# Patient Record
Sex: Male | Born: 1975 | Race: Black or African American | Hispanic: No | Marital: Single | Smoking: Never smoker
Health system: Southern US, Community
[De-identification: ages and names within clinical notes are randomized; demographics above are authoritative.]

## PROBLEM LIST (undated history)

## (undated) DIAGNOSIS — Q211 Atrial septal defect: Secondary | ICD-10-CM

## (undated) DIAGNOSIS — I10 Essential (primary) hypertension: Secondary | ICD-10-CM

## (undated) DIAGNOSIS — Q2112 Patent foramen ovale: Secondary | ICD-10-CM

## (undated) DIAGNOSIS — I639 Cerebral infarction, unspecified: Secondary | ICD-10-CM

## (undated) HISTORY — DX: Patent foramen ovale: Q21.12

## (undated) HISTORY — DX: Atrial septal defect: Q21.1

---

## 2001-02-26 ENCOUNTER — Encounter: Payer: Self-pay | Admitting: Internal Medicine

## 2001-02-26 ENCOUNTER — Emergency Department (HOSPITAL_COMMUNITY): Admission: EM | Admit: 2001-02-26 | Discharge: 2001-02-26 | Payer: Self-pay | Admitting: Emergency Medicine

## 2005-01-30 ENCOUNTER — Emergency Department (HOSPITAL_COMMUNITY): Admission: EM | Admit: 2005-01-30 | Discharge: 2005-01-31 | Payer: Self-pay | Admitting: Emergency Medicine

## 2020-10-01 ENCOUNTER — Inpatient Hospital Stay (HOSPITAL_COMMUNITY): Payer: Medicaid Other

## 2020-10-01 ENCOUNTER — Emergency Department (HOSPITAL_COMMUNITY): Payer: Medicaid Other

## 2020-10-01 ENCOUNTER — Encounter (HOSPITAL_COMMUNITY): Payer: Self-pay | Admitting: Emergency Medicine

## 2020-10-01 ENCOUNTER — Inpatient Hospital Stay (HOSPITAL_COMMUNITY)
Admission: EM | Admit: 2020-10-01 | Discharge: 2020-11-02 | DRG: 061 | Disposition: A | Discharge status: Skilled Nursing Facility | Payer: Medicaid Other | Attending: Neurology | Admitting: Neurology

## 2020-10-01 DIAGNOSIS — G8194 Hemiplegia, unspecified affecting left nondominant side: Secondary | ICD-10-CM | POA: Diagnosis present

## 2020-10-01 DIAGNOSIS — Z833 Family history of diabetes mellitus: Secondary | ICD-10-CM

## 2020-10-01 DIAGNOSIS — R011 Cardiac murmur, unspecified: Secondary | ICD-10-CM

## 2020-10-01 DIAGNOSIS — R29710 NIHSS score 10: Secondary | ICD-10-CM | POA: Diagnosis present

## 2020-10-01 DIAGNOSIS — G936 Cerebral edema: Secondary | ICD-10-CM | POA: Diagnosis present

## 2020-10-01 DIAGNOSIS — R1310 Dysphagia, unspecified: Secondary | ICD-10-CM | POA: Diagnosis present

## 2020-10-01 DIAGNOSIS — R414 Neurologic neglect syndrome: Secondary | ICD-10-CM | POA: Diagnosis present

## 2020-10-01 DIAGNOSIS — R29810 Facial weakness: Secondary | ICD-10-CM | POA: Diagnosis present

## 2020-10-01 DIAGNOSIS — I161 Hypertensive emergency: Secondary | ICD-10-CM | POA: Diagnosis present

## 2020-10-01 DIAGNOSIS — R54 Age-related physical debility: Secondary | ICD-10-CM | POA: Diagnosis not present

## 2020-10-01 DIAGNOSIS — Z8049 Family history of malignant neoplasm of other genital organs: Secondary | ICD-10-CM | POA: Diagnosis not present

## 2020-10-01 DIAGNOSIS — Z823 Family history of stroke: Secondary | ICD-10-CM | POA: Diagnosis not present

## 2020-10-01 DIAGNOSIS — R471 Dysarthria and anarthria: Secondary | ICD-10-CM | POA: Diagnosis present

## 2020-10-01 DIAGNOSIS — E7800 Pure hypercholesterolemia, unspecified: Secondary | ICD-10-CM | POA: Diagnosis present

## 2020-10-01 DIAGNOSIS — Q211 Atrial septal defect: Secondary | ICD-10-CM

## 2020-10-01 DIAGNOSIS — Z20822 Contact with and (suspected) exposure to covid-19: Secondary | ICD-10-CM | POA: Diagnosis present

## 2020-10-01 DIAGNOSIS — I119 Hypertensive heart disease without heart failure: Secondary | ICD-10-CM | POA: Diagnosis present

## 2020-10-01 DIAGNOSIS — E785 Hyperlipidemia, unspecified: Secondary | ICD-10-CM | POA: Diagnosis present

## 2020-10-01 DIAGNOSIS — R509 Fever, unspecified: Secondary | ICD-10-CM | POA: Diagnosis present

## 2020-10-01 DIAGNOSIS — D649 Anemia, unspecified: Secondary | ICD-10-CM | POA: Diagnosis present

## 2020-10-01 DIAGNOSIS — I63511 Cerebral infarction due to unspecified occlusion or stenosis of right middle cerebral artery: Secondary | ICD-10-CM

## 2020-10-01 DIAGNOSIS — I313 Pericardial effusion (noninflammatory): Secondary | ICD-10-CM | POA: Diagnosis present

## 2020-10-01 DIAGNOSIS — N390 Urinary tract infection, site not specified: Secondary | ICD-10-CM | POA: Diagnosis not present

## 2020-10-01 DIAGNOSIS — K567 Ileus, unspecified: Secondary | ICD-10-CM

## 2020-10-01 DIAGNOSIS — I429 Cardiomyopathy, unspecified: Secondary | ICD-10-CM | POA: Diagnosis present

## 2020-10-01 DIAGNOSIS — E876 Hypokalemia: Secondary | ICD-10-CM | POA: Diagnosis present

## 2020-10-01 DIAGNOSIS — B961 Klebsiella pneumoniae [K. pneumoniae] as the cause of diseases classified elsewhere: Secondary | ICD-10-CM | POA: Diagnosis not present

## 2020-10-01 DIAGNOSIS — F3289 Other specified depressive episodes: Secondary | ICD-10-CM | POA: Diagnosis not present

## 2020-10-01 DIAGNOSIS — I639 Cerebral infarction, unspecified: Secondary | ICD-10-CM

## 2020-10-01 DIAGNOSIS — R112 Nausea with vomiting, unspecified: Secondary | ICD-10-CM | POA: Diagnosis present

## 2020-10-01 DIAGNOSIS — R4189 Other symptoms and signs involving cognitive functions and awareness: Secondary | ICD-10-CM | POA: Diagnosis present

## 2020-10-01 DIAGNOSIS — Z560 Unemployment, unspecified: Secondary | ICD-10-CM | POA: Diagnosis not present

## 2020-10-01 DIAGNOSIS — I454 Nonspecific intraventricular block: Secondary | ICD-10-CM | POA: Diagnosis not present

## 2020-10-01 DIAGNOSIS — Z6820 Body mass index (BMI) 20.0-20.9, adult: Secondary | ICD-10-CM

## 2020-10-01 DIAGNOSIS — I10 Essential (primary) hypertension: Secondary | ICD-10-CM

## 2020-10-01 DIAGNOSIS — I63411 Cerebral infarction due to embolism of right middle cerebral artery: Principal | ICD-10-CM | POA: Diagnosis present

## 2020-10-01 DIAGNOSIS — I255 Ischemic cardiomyopathy: Secondary | ICD-10-CM

## 2020-10-01 HISTORY — DX: Essential (primary) hypertension: I10

## 2020-10-01 LAB — I-STAT CHEM 8, ED
BUN: 14 mg/dL (ref 6–20)
Calcium, Ion: 1.06 mmol/L — ABNORMAL LOW (ref 1.15–1.40)
Chloride: 105 mmol/L (ref 98–111)
Creatinine, Ser: 0.8 mg/dL (ref 0.61–1.24)
Glucose, Bld: 170 mg/dL — ABNORMAL HIGH (ref 70–99)
HCT: 28 % — ABNORMAL LOW (ref 39.0–52.0)
Hemoglobin: 9.5 g/dL — ABNORMAL LOW (ref 13.0–17.0)
Potassium: 3.6 mmol/L (ref 3.5–5.1)
Sodium: 137 mmol/L (ref 135–145)
TCO2: 24 mmol/L (ref 22–32)

## 2020-10-01 LAB — DIFFERENTIAL
Abs Immature Granulocytes: 0.06 10*3/uL (ref 0.00–0.07)
Basophils Absolute: 0.1 10*3/uL (ref 0.0–0.1)
Basophils Relative: 1 %
Eosinophils Absolute: 0 10*3/uL (ref 0.0–0.5)
Eosinophils Relative: 0 %
Immature Granulocytes: 1 %
Lymphocytes Relative: 14 %
Lymphs Abs: 1.5 10*3/uL (ref 0.7–4.0)
Monocytes Absolute: 0.8 10*3/uL (ref 0.1–1.0)
Monocytes Relative: 7 %
Neutro Abs: 8.4 10*3/uL — ABNORMAL HIGH (ref 1.7–7.7)
Neutrophils Relative %: 77 %

## 2020-10-01 LAB — COMPREHENSIVE METABOLIC PANEL
ALT: 11 U/L (ref 0–44)
AST: 24 U/L (ref 15–41)
Albumin: 2.1 g/dL — ABNORMAL LOW (ref 3.5–5.0)
Alkaline Phosphatase: 44 U/L (ref 38–126)
Anion gap: 12 (ref 5–15)
BUN: 12 mg/dL (ref 6–20)
CO2: 21 mmol/L — ABNORMAL LOW (ref 22–32)
Calcium: 8.4 mg/dL — ABNORMAL LOW (ref 8.9–10.3)
Chloride: 105 mmol/L (ref 98–111)
Creatinine, Ser: 0.92 mg/dL (ref 0.61–1.24)
GFR, Estimated: >60 mL/min (ref >60)
Glucose, Bld: 170 mg/dL — ABNORMAL HIGH (ref 70–99)
Potassium: 3.6 mmol/L (ref 3.5–5.1)
Sodium: 138 mmol/L (ref 135–145)
Total Bilirubin: 0.9 mg/dL (ref 0.3–1.2)
Total Protein: 5 g/dL — ABNORMAL LOW (ref 6.5–8.1)

## 2020-10-01 LAB — PROTIME-INR
INR: 1.1 (ref 0.8–1.2)
Prothrombin Time: 13.8 seconds (ref 11.4–15.2)

## 2020-10-01 LAB — CBC
HCT: 32.6 % — ABNORMAL LOW (ref 39.0–52.0)
Hemoglobin: 10.7 g/dL — ABNORMAL LOW (ref 13.0–17.0)
MCH: 29 pg (ref 26.0–34.0)
MCHC: 32.8 g/dL (ref 30.0–36.0)
MCV: 88.3 fL (ref 80.0–100.0)
Platelets: 347 10*3/uL (ref 150–400)
RBC: 3.69 MIL/uL — ABNORMAL LOW (ref 4.22–5.81)
RDW: 13.1 % (ref 11.5–15.5)
WBC: 10.9 10*3/uL — ABNORMAL HIGH (ref 4.0–10.5)
nRBC: 0 % (ref 0.0–0.2)

## 2020-10-01 LAB — APTT: aPTT: 22 seconds — ABNORMAL LOW (ref 24–36)

## 2020-10-01 MED ORDER — ORAL CARE MOUTH RINSE
15.0000 mL | Freq: Two times a day (BID) | OROMUCOSAL | Status: DC
  Administered 2020-10-02 – 2020-10-11 (×15): 15 mL via OROMUCOSAL

## 2020-10-01 MED ORDER — ACETAMINOPHEN 650 MG RE SUPP
650.0000 mg | RECTAL | Status: DC | PRN
Start: 2020-10-01 — End: 2020-11-02

## 2020-10-01 MED ORDER — ONDANSETRON HCL 4 MG/2ML IJ SOLN
4.0000 mg | Freq: Once | INTRAMUSCULAR | Status: AC
  Administered 2020-10-01: 4 mg via INTRAVENOUS
  Filled 2020-10-01: qty 2

## 2020-10-01 MED ORDER — CHLORHEXIDINE GLUCONATE CLOTH 2 % EX PADS
6.0000 | MEDICATED_PAD | Freq: Every day | CUTANEOUS | Status: DC
  Administered 2020-10-01 – 2020-10-19 (×16): 6 via TOPICAL

## 2020-10-01 MED ORDER — CHLORHEXIDINE GLUCONATE 0.12 % MT SOLN
15.0000 mL | Freq: Two times a day (BID) | OROMUCOSAL | Status: DC
  Administered 2020-10-01 – 2020-11-02 (×60): 15 mL via OROMUCOSAL
  Filled 2020-10-01 (×59): qty 15

## 2020-10-01 MED ORDER — SENNOSIDES-DOCUSATE SODIUM 8.6-50 MG PO TABS
1.0000 | ORAL_TABLET | Freq: Every evening | ORAL | Status: DC | PRN
  Administered 2020-10-06: 1 via ORAL
  Filled 2020-10-01 (×3): qty 1

## 2020-10-01 MED ORDER — STROKE: EARLY STAGES OF RECOVERY BOOK
Freq: Once | Status: DC
  Filled 2020-10-01: qty 1

## 2020-10-01 MED ORDER — ACETAMINOPHEN 325 MG PO TABS
650.0000 mg | ORAL_TABLET | ORAL | Status: DC | PRN
  Administered 2020-10-03 – 2020-10-10 (×7): 650 mg via ORAL
  Filled 2020-10-01 (×7): qty 2

## 2020-10-01 MED ORDER — ACETAMINOPHEN 160 MG/5ML PO SOLN: 650.0000 mg | ORAL | Status: DC | PRN

## 2020-10-01 MED ORDER — IOHEXOL 350 MG/ML SOLN
75.0000 mL | Freq: Once | INTRAVENOUS | Status: AC | PRN
  Administered 2020-10-01: 75 mL via INTRAVENOUS

## 2020-10-01 MED ORDER — ALTEPLASE (STROKE) FULL DOSE INFUSION
0.9000 mg/kg | Freq: Once | INTRAVENOUS | Status: AC
  Administered 2020-10-01: 53.6 mg via INTRAVENOUS
  Filled 2020-10-01: qty 100

## 2020-10-01 MED ORDER — SODIUM CHLORIDE 0.9 % IV SOLN: INTRAVENOUS | Status: DC

## 2020-10-01 MED ORDER — CLEVIDIPINE BUTYRATE 0.5 MG/ML IV EMUL
0.0000 mg/h | INTRAVENOUS | Status: DC
  Administered 2020-10-01: 2 mg/h via INTRAVENOUS

## 2020-10-01 MED ORDER — SODIUM CHLORIDE 0.9 % IV SOLN
50.0000 mL | Freq: Once | INTRAVENOUS | Status: AC
  Administered 2020-10-01: 50 mL via INTRAVENOUS

## 2020-10-01 MED ORDER — SODIUM CHLORIDE 0.9% FLUSH
3.0000 mL | Freq: Once | INTRAVENOUS | Status: AC
Start: 2020-10-01 — End: 2020-10-01
  Administered 2020-10-01: 3 mL via INTRAVENOUS

## 2020-10-01 MED ORDER — PANTOPRAZOLE SODIUM 40 MG IV SOLR
40.0000 mg | Freq: Every day | INTRAVENOUS | Status: DC
  Administered 2020-10-01: 40 mg via INTRAVENOUS
  Filled 2020-10-01: qty 40

## 2020-10-01 NOTE — Progress Notes (Signed)
PHARMACIST CODE STROKE RESPONSE  Notified to mix tPA at 1823 by Dr. Roda Shutters Delivered tPA to RN at 1827  tPA dose = 5.4mg  bolus over 1 minute followed by 48.2mg  for a total dose of 53.6mg  over 1 hour  Issues/delays encountered (if applicable): HTN control requiring multiple doses of labetalol and cleveiprex gtt, pt N/V control in CT scanner  Harry [name] 10/01/20 7:13 PM

## 2020-10-01 NOTE — ED Notes (Signed)
Patient at this time seen vomiting. Patient cleaned and placed in new gown. MDF called for PRN orders

## 2020-10-01 NOTE — ED Triage Notes (Signed)
Patient to ED for complaints of possible stroke from home. LKW 1525. Patient states he was reading and had episode of vomiting and was not able to move left side. Brother called EMS. Patient AxO4, answering questions app, left sided neglect and facial droop noted.

## 2020-10-01 NOTE — H&P (Signed)
Stroke Neurology admission Note  Consult Requested by: Dr. Wilkie Aye  Reason for Consult: code stroke  Consult Date: 10/01/20   The history was obtained from the pt and EMS.  During history and examination, all items were able to obtain unless otherwise noted.  History of Present Illness:  Harry [name] is a 45 y.o. African American male with PMH of heart murmur not on medications presented to ER for acute onset left arm and leg weakness, left neglect, right gaze preference, nausea/vomiting.  Patient fully awake alert orientated, per patient he stated that he was reading from his cell phone around 5:25 PM, he had acute onset not feeling well, nausea and vomiting.  However, he denies any arm or leg weakness numbness.  His brother is in the same house but given room with him, brother saw him last at 75 AM at baseline, but then saw him after he had nausea vomiting and noticed that he was not able to move on the left side with left facial droop.  EMS were called.  On arrival BP 192/128, glucose 182.  Patient sent to ER for evaluation with code stroke activation.  In ER, patient was found to have right gaze preference, left visual and sensation neglect, left arm flaccid, left lower extremity weakness.  CT early ischemic changes around right MCA but no hypodensity.  Discussed with patient regarding better and the risk of tPA, he agreed to proceed.  However, patient BP was high, needed labetalol and Cleviprex drip for BP control and his BP at 167/103 at time of given tPA.  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  Discussed with patient regarding risk and benefit of thrombectomy, patient stated that he is scared with the procedure and his uncle died on the operating room and he refused thrombectomy.  Will admit patient for post tPA care and stroke work-up.  LSN: 5:25 PM tPA Given: Yes Thrombectomy: Patient declined  MRS = 0  History reviewed. No pertinent past medical  history.  History reviewed. No pertinent surgical history.  History reviewed. No pertinent family history.  Social History:  has no history on file for tobacco use, alcohol use, and drug use.  Allergies: Not on File  No current facility-administered medications on file prior to encounter.   No current outpatient medications on file prior to encounter.    Review of Systems: A full ROS was attempted today and was able to be performed.  Systems assessed include - Constitutional, Eyes, HENT, Respiratory, Cardiovascular, Gastrointestinal, Genitourinary, Integument/breast, Hematologic/lymphatic, Musculoskeletal, Neurological, Behavioral/Psych, Endocrine, Allergic/Immunologic - with pertinent responses as per HPI.  Physical Examination: Pulse Rate:  [95] 95 (04/18 1815) Resp:  [16] 16 (04/18 1815) BP: (181)/(114) 181/114 (04/18 1815) SpO2:  [96 %] 96 % (04/18 1815) Weight:  [59.6 kg] 59.6 kg (04/18 1815)  General - well nourished, well developed, lethargic.    Ophthalmologic - fundi not visualized due to noncooperation.    Cardiovascular - regular rhythm and rate  Neuro - lethargic, but awake, alert, eyes open on voice, orientated to age, place, time and people. No aphasia, fluent language, following all simple commands. Able to name and repeat.  No dysarthria.  Right gaze preference, however able to have complete left gaze, visual field full no hemianopia, however left simultanagnosia, PERRL.  Left facial droop. Tongue protrusion to the left.  Right upper and lower extremity 5/5, left upper extremity flaccid with slight withdrawal to pain, left lower extremity 3/5 with drift but not  hitting bed within 5 seconds.  Sensation subjectively symmetrical, however left sensory neglect.  Right finger-to-nose intact.  Gait not tested.    NIH Stroke Scale  Level Of Consciousness 0=Alert; keenly responsive 1=Arouse to minor stimulation 2=Requires repeated stimulation to arouse or movements to  pain 3=postures or unresponsive 1  LOC Questions to Month and Age 31=Answers both questions correctly 1=Answers one question correctly or dysarthria/intubated/trauma/language barrier 2=Answers neither question correctly or aphasia 0  LOC Commands      -Open/Close eyes     -Open/close grip     -Pantomime commands if communication barrier 0=Performs both tasks correctly 1=Performs one task correctly 2=Performs neighter task correctly 0  Best Gaze     -Only assess horizontal gaze 0=Normal 1=Partial gaze palsy 2=Forced deviation, or total gaze paresis 1  Visual 0=No visual loss 1=Partial hemianopia 2=Complete hemianopia 3=Bilateral hemianopia (blind including cortical blindness) 0  Facial Palsy     -Use grimace if obtunded 0=Normal symmetrical movement 1=Minor paralysis (asymmetry) 2=Partial paralysis (lower face) 3=Complete paralysis (upper and lower face) 2  Motor  0=No drift for 10/5 seconds 1=Drift, but does not hit bed 2=Some antigravity effort, hits  bed 3=No effort against gravity, limb falls 4=No movement 0=Amputation/joint fusion Right Arm 0     Leg 0    Left Arm 3     Leg 1  Limb Ataxia     - FNT/HTS 0=Absent or does not understand or paralyzed or amputation/joint fusion 1=Present in one limb 2=Present in two limbs 0  Sensory 0=Normal 1=Mild to moderate sensory loss 2=Severe to total sensory loss or coma/unresponsive 0  Best Language 0=No aphasia, normal 1=Mild to moderate aphasia 2=Severe aphasia 3=Mute, global aphasia, or coma/unresponsive 0  Dysarthria 0=Normal 1=Mild to moderate 2=Severe, unintelligible or mute/anarthric 0=intubated/unable to test 0  Extinction/Neglect 0=No abnormality 1=visual/tactile/auditory/spatia/personal inattention/Extinction to bilateral simultaneous stimulation 2=Profound neglect/extinction more than 1 modality  2  Total   10     Data Reviewed: CT HEAD CODE STROKE WO CONTRAST  Result Date: 10/01/2020 CLINICAL DATA:  Code  stroke. Acute neuro deficit. Rule out stroke. Left facial droop EXAM: CT HEAD WITHOUT CONTRAST TECHNIQUE: Contiguous axial images were obtained from the base of the skull through the vertex without intravenous contrast. COMPARISON:  None. FINDINGS: Brain: Ventricle size and cerebral volume normal. Well-defined hypodensity left thalamus compatible with chronic infarction. Negative for acute infarct, hemorrhage, mass Vascular: Negative for hyperdense vessel Skull: Negative Sinuses/Orbits: Mild mucosal edema paranasal sinuses. Large right mastoid effusion and middle ear effusion. Left mastoid sinus clear. Other: None ASPECTS (Alberta Stroke Program Early CT Score) - Ganglionic level infarction (caudate, lentiform nuclei, internal capsule, insula, M1-M3 cortex): 7 - Supraganglionic infarction (M4-M6 cortex): 3 Total score (0-10 with 10 being normal): 10 IMPRESSION: 1. No acute intracranial abnormality 2. ASPECTS is 10 3. Code stroke imaging results were communicated on 10/01/2020 at 6:19 pm to provider Roda Shutters via text page Electronically Signed   By: Marlan Palau M.D.   On: 10/01/2020 18:20   CT ANGIO HEAD NECK W WO CM (CODE STROKE)  Result Date: 10/01/2020 CLINICAL DATA:  Left facial droop EXAM: CT HEAD WITHOUT CONTRAST CT ANGIOGRAPHY HEAD AND NECK TECHNIQUE: Multidetector CT imaging of the head and neck was performed using the standard protocol during bolus administration of intravenous contrast. Multiplanar CT image reconstructions and MIPs were obtained to evaluate the vascular anatomy. Carotid stenosis measurements (when applicable) are obtained utilizing NASCET criteria, using the distal internal carotid diameter as the denominator. CONTRAST:  54mL  OMNIPAQUE IOHEXOL 350 MG/ML SOLN COMPARISON:  CT head 10/01/2020 FINDINGS: CTA NECK FINDINGS Aortic arch: Standard branching. Imaged portion shows no evidence of aneurysm or dissection. No significant stenosis of the major arch vessel origins. Right carotid system:  Normal right carotid. Negative for atherosclerotic disease or stenosis. Left carotid system: Normal left carotid. Negative for atherosclerotic disease or stenosis. Vertebral arteries: Both vertebral arteries widely patent without stenosis. Skeleton: Cervicothoracic scoliosis. No acute skeletal abnormality. Multiple caries. Periapical lucency around right lower molar. Other neck: No mass or adenopathy. 12 mm left thyroid nodule. No further imaging necessary. Upper chest: Lung apices clear bilaterally. Review of the MIP images confirms the above findings CTA HEAD FINDINGS Anterior circulation: Anterior and middle cerebral arteries normal bilaterally. No stenosis or large vessel occlusion. Posterior circulation: Normal posterior circulation. No stenosis or large vessel occlusion. Venous sinuses: Normal venous enhancement Anatomic variants: None Delayed phase: Not performed Review of the MIP images confirms the above findings IMPRESSION: Normal CT angiogram head and neck. No significant atherosclerotic disease. No stenosis or intracranial large vessel occlusion. Electronically Signed   By: Marlan Palau M.D.   On: 10/01/2020 19:16    Assessment: 45 y.o. male with PMH of heart murmur not on medications presented to ER for acute onset left arm and leg weakness, left visual and sensory neglect, right gaze preference, left facial droop nausea/vomiting.  Patient awake alert fully orientated, able to make his own decision.  Time onset around 5:25 PM, NIH score 10. CT early ischemic changes right MCA but no hypodensity.  Discussed with patient regarding better and the risk of tPA, he agreed to proceed.  However, patient BP was high with nausea vomiting episodes, needed labetalol and Cleviprex drip for BP control.  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  Discussed with patient regarding risk and benefit of thrombectomy, patient stated that he is scared with the procedure and his uncle died on the  operating room and he refused thrombectomy. We had severe rounds of discussion and he insisted no thrombectomy. Will admit patient for post tPA care and stroke work-up.   Stroke Risk Factors - likely uncontrolled hypertension  Plan: - Admit to ICU for routine post IV tPA care  - Check blood pressure and NIHSS every 15 min for 2 h, then every 30 min for 6 h, and finally every hour for 16 h - BP goal < 180/105 - CT or MRI brain 24 hours post tPA - Stat CT head without contrast if acute neuro changes - NPO until swallowing screen performed and passed - No antiplatelet agents or anticoagulants (including heparin for DVT prophylaxis) in first 24 hours - No Foley catheter, nasogastric tube, arterial catheter or central venous catheter for 24 hours, unless absolutely necessary - Telemetry - Euglycemia  - Avoid hyperthermia, PRN acetaminophen - DVT prophylaxis with SCDs  Marvel Plan, MD PhD Stroke Neurology 10/01/2020 7:46 PM  This patient is critically ill due to acute stroke status post tPA, large vessel occlusion declined procedure, hypertensive emergency, nausea vomiting concerning for aspiration and at significant risk of neurological worsening, death form stroke extension, hemorrhagic conversion, bleeding from tPA, sepsis, seizure. This patient's care requires constant monitoring of vital signs, hemodynamics, respiratory and cardiac monitoring, review of multiple databases, neurological assessment, discussion with family, other specialists and medical decision making of high complexity. I spent 60 minutes of neurocritical care time in the care of this patient.

## 2020-10-01 NOTE — ED Provider Notes (Signed)
[facility] EMERGENCY DEPARTMENT Provider Note   CSN: [id number] Arrival date & time: 10/01/20  1803     History No chief complaint on file.   Harry [name] is a 45 y.o. male.  HPI   45 year old male presents the emergency department as a code stroke.  Patient evaluated at EMS bridge with neuro team at bedside.  History per EMS due to patient's acuity and current symptoms.  Patient reportedly about an hour prior to arrival was sitting down reading when he experienced headache, nausea/vomiting.  His brother was home with him and called an ambulance.  Last reported known normal was around 11 AM.  On arrival there is active vomiting, he is neglecting the left side with profound left-sided weakness.  He is hypertensive.    History reviewed. No pertinent past medical history.  There are no problems to display for this patient.   History reviewed. No pertinent surgical history.     History reviewed. No pertinent family history.     Home Medications Prior to Admission medications   Not on File    Allergies    Patient has no allergy information on record.  Review of Systems   Review of Systems  Unable to perform ROS: Acuity of condition    Physical Exam Updated Vital Signs There were no vitals taken for this visit.  Physical Exam Vitals and nursing note reviewed.  Constitutional:      Appearance: Normal appearance.  HENT:     Head: Normocephalic.     Mouth/Throat:     Mouth: Mucous membranes are moist.  Eyes:     Pupils: Pupils are equal, round, and reactive to light.  Cardiovascular:     Rate and Rhythm: Normal rate.     Comments: Hypertensive Pulmonary:     Effort: Pulmonary effort is normal. No respiratory distress.  Abdominal:     Palpations: Abdomen is soft.  Skin:    General: Skin is warm.  Neurological:     Mental Status: He is alert.     Comments: Please see NIH  Psychiatric:        Mood and Affect: Mood normal.     ED  Results / Procedures / Treatments   Labs (all labs ordered are listed, but only abnormal results are displayed) Labs Reviewed  CBC - Abnormal; Notable for the following components:      Result Value   WBC 10.9 (*)    RBC 3.69 (*)    Hemoglobin 10.7 (*)    HCT 32.6 (*)    All other components within normal limits  DIFFERENTIAL - Abnormal; Notable for the following components:   Neutro Abs 8.4 (*)    All other components within normal limits  I-STAT CHEM 8, ED - Abnormal; Notable for the following components:   Glucose, Bld 170 (*)    Calcium, Ion 1.06 (*)    Hemoglobin 9.5 (*)    HCT 28.0 (*)    All other components within normal limits  PROTIME-INR  APTT  COMPREHENSIVE METABOLIC PANEL  CBG MONITORING, ED    EKG None  Radiology CT HEAD CODE STROKE WO CONTRAST  Result Date: 10/01/2020 CLINICAL DATA:  Code stroke. Acute neuro deficit. Rule out stroke. Left facial droop EXAM: CT HEAD WITHOUT CONTRAST TECHNIQUE: Contiguous axial images were obtained from the base of the skull through the vertex without intravenous contrast. COMPARISON:  None. FINDINGS: Brain: Ventricle size and cerebral volume normal. Well-defined hypodensity left thalamus compatible with chronic  infarction. Negative for acute infarct, hemorrhage, mass Vascular: Negative for hyperdense vessel Skull: Negative Sinuses/Orbits: Mild mucosal edema paranasal sinuses. Large right mastoid effusion and middle ear effusion. Left mastoid sinus clear. Other: None ASPECTS (Alberta Stroke Program Early CT Score) - Ganglionic level infarction (caudate, lentiform nuclei, internal capsule, insula, M1-M3 cortex): 7 - Supraganglionic infarction (M4-M6 cortex): 3 Total score (0-10 with 10 being normal): 10 IMPRESSION: 1. No acute intracranial abnormality 2. ASPECTS is 10 3. Code stroke imaging results were communicated on 10/01/2020 at 6:19 pm to provider Roda Shutters via text page Electronically Signed   By: Marlan Palau M.D.   On: 10/01/2020 18:20     Procedures .Critical Care Performed by: Rozelle Logan, DO Authorized by: Rozelle Logan, DO   Critical care provider statement:    Critical care time (minutes):  45   Critical care was necessary to treat or prevent imminent or life-threatening deterioration of the following conditions:  CNS failure or compromise   Critical care was time spent personally by me on the following activities:  Discussions with consultants, evaluation of patient's response to treatment, examination of patient, ordering and performing treatments and interventions, ordering and review of laboratory studies, ordering and review of radiographic studies, pulse oximetry, re-evaluation of patient's condition, obtaining history from patient or surrogate and review of old charts     Medications Ordered in ED Medications  sodium chloride flush (NS) 0.9 % injection 3 mL (has no administration in time range)  clevidipine (CLEVIPREX) infusion 0.5 mg/mL (has no administration in time range)    ED Course  I have reviewed the triage vital signs and the nursing notes.  Pertinent labs & imaging results that were available during my care of the patient were reviewed by me and considered in my medical decision making (see chart for details).    MDM Rules/Calculators/A&P                          45 year old male presents the emergency department as a code stroke.  Evaluated at the EMS bridge with stroke neurology team at bedside, patient has high NIH score.  Head CT shows no acute bleed, decision made to give tPA by the neurology stroke team, patient was hypertensive, currently receiving labetalol for blood pressure control.  Neuro exam unchanged on arrival to patient room, family will be notified and updated.  Plan for NICU admission for post tPA care frequent neurochecks.  Patients evaluation and results requires admission for further treatment and care. Patient agrees with admission plan, offers no new complaints  and is stable/unchanged at time of admit.  Final Clinical Impression(s) / ED Diagnoses Final diagnoses:  None    Rx / DC Orders ED Discharge Orders    None       Rozelle Logan, DO 10/01/20 1958

## 2020-10-01 NOTE — ED Notes (Signed)
Family at bedside speak with neuro at this time

## 2020-10-02 ENCOUNTER — Inpatient Hospital Stay (HOSPITAL_COMMUNITY): Payer: Medicaid Other

## 2020-10-02 DIAGNOSIS — I6389 Other cerebral infarction: Secondary | ICD-10-CM

## 2020-10-02 LAB — LIPID PANEL
Cholesterol: 297 mg/dL — ABNORMAL HIGH (ref 0–200)
HDL: 73 mg/dL (ref >40)
LDL Cholesterol: 212 mg/dL — ABNORMAL HIGH (ref 0–99)
Total CHOL/HDL Ratio: 4.1 RATIO
Triglycerides: 62 mg/dL (ref <150)
VLDL: 12 mg/dL (ref 0–40)

## 2020-10-02 LAB — SARS CORONAVIRUS 2 (TAT 6-24 HRS): SARS Coronavirus 2: NEGATIVE

## 2020-10-02 LAB — ECHOCARDIOGRAM COMPLETE
AR max vel: 3.97 cm2
AV Area VTI: 3.31 cm2
AV Area mean vel: 3.91 cm2
AV Mean grad: 3 mmHg
AV Peak grad: 5.1 mmHg
Ao pk vel: 1.13 m/s
Area-P 1/2: 4.41 cm2
S' Lateral: 3.4 cm
Weight: 2102.31 oz

## 2020-10-02 LAB — HIV ANTIBODY (ROUTINE TESTING W REFLEX): HIV Screen 4th Generation wRfx: NONREACTIVE

## 2020-10-02 LAB — HEMOGLOBIN A1C
Hgb A1c MFr Bld: 5.3 % (ref 4.8–5.6)
Mean Plasma Glucose: 105.41 mg/dL

## 2020-10-02 LAB — MRSA PCR SCREENING: MRSA by PCR: NEGATIVE

## 2020-10-02 MED ORDER — ATORVASTATIN CALCIUM 80 MG PO TABS
80.0000 mg | ORAL_TABLET | Freq: Every day | ORAL | Status: DC
  Administered 2020-10-02 – 2020-11-02 (×32): 80 mg via ORAL
  Filled 2020-10-02 (×8): qty 1
  Filled 2020-10-02: qty 2
  Filled 2020-10-02 (×23): qty 1

## 2020-10-02 MED ORDER — ASPIRIN EC 325 MG PO TBEC
325.0000 mg | DELAYED_RELEASE_TABLET | Freq: Every day | ORAL | Status: DC
  Administered 2020-10-02 – 2020-10-10 (×9): 325 mg via ORAL
  Filled 2020-10-02 (×10): qty 1

## 2020-10-02 MED ORDER — CLOPIDOGREL BISULFATE 75 MG PO TABS
75.0000 mg | ORAL_TABLET | Freq: Every day | ORAL | Status: DC
  Administered 2020-10-02 – 2020-11-02 (×32): 75 mg via ORAL
  Filled 2020-10-02 (×32): qty 1

## 2020-10-02 MED ORDER — ENOXAPARIN SODIUM 30 MG/0.3ML ~~LOC~~ SOLN
30.0000 mg | SUBCUTANEOUS | Status: DC
  Administered 2020-10-02 – 2020-10-12 (×11): 30 mg via SUBCUTANEOUS
  Filled 2020-10-02 (×11): qty 0.3

## 2020-10-02 MED ORDER — PANTOPRAZOLE SODIUM 40 MG PO TBEC
40.0000 mg | DELAYED_RELEASE_TABLET | Freq: Every day | ORAL | Status: DC
  Filled 2020-10-02: qty 1

## 2020-10-02 MED ORDER — LABETALOL HCL 5 MG/ML IV SOLN
5.0000 mg | INTRAVENOUS | Status: DC | PRN
  Administered 2020-10-03: 5 mg via INTRAVENOUS
  Administered 2020-10-03: 20 mg via INTRAVENOUS
  Filled 2020-10-02 (×5): qty 4

## 2020-10-02 NOTE — Progress Notes (Deleted)
AM labs drawn seemed to be a large drop from previous results, Labs redrawn and results reading closer to previous.

## 2020-10-02 NOTE — Evaluation (Signed)
Clinical/Bedside Swallow Evaluation Patient Details  Name: Harry [name] MRN: [id number] Date of Birth: 1975/09/03  Today's Date: 10/02/2020 Time: SLP Start Time (ACUTE ONLY): 1219 SLP Stop Time (ACUTE ONLY): 1229 SLP Time Calculation (min) (ACUTE ONLY): 9.68 min  Past Medical History: History reviewed. No pertinent past medical history. Past Surgical History: History reviewed. No pertinent surgical history. HPI:  45 y.o. male with PMH of heart murmur not on medications presented to ER 4/18 for acute onset left arm and leg weakness, left visual and sensory neglect, right gaze preference, left facial droop nausea/vomiting. CT head showed early ischemic changes right MCA but no hypodensity; CTA right M2/M3 occlusion.  tPA initiated. Pt declined thrombectomy.   Assessment / Plan / Recommendation Clinical Impression  Pt participated in initial bedside swallowing assessment.  Presents with focal CN deficits on left - V, VII, and XII with facial asymmetry and tongue deviation to left, decreased sensation left.  Pt accepted sips of water from cup and straw; consumed 3 oz with no s/s of aspiration but spillage from left side of mouth.  There was reduced awareness of pureed bolus when presented from left side; residue in left lateral sulcus and spillage on left lips post-swallow without awareness.  Recommend initiating a dysphagia 1 diet for now; thin liquids; crush meds and give in puree. SLP will follow for diet advancement/safety. SLP Visit Diagnosis: Dysphagia, oropharyngeal phase (R13.12)    Aspiration Risk  Mild aspiration risk    Diet Recommendation   dysphagia 1, thin liquids  Medication Administration: Crushed with puree    Other  Recommendations Oral Care Recommendations: Oral care BID   Follow up Recommendations Inpatient Rehab      Frequency and Duration min 2x/week  2 weeks       Prognosis Prognosis for Safe Diet Advancement: Good      Swallow Study   General Date of  Onset: 10/01/20 HPI: 45 y.o. male with PMH of heart murmur not on medications presented to ER 4/18 for acute onset left arm and leg weakness, left visual and sensory neglect, right gaze preference, left facial droop nausea/vomiting. CT head showed early ischemic changes right MCA but no hypodensity; CTA right M2/M3 occlusion.  tPA initiated. Pt declined thrombectomy. Type of Study: Bedside Swallow Evaluation Previous Swallow Assessment: no Diet Prior to this Study: NPO Temperature Spikes Noted: No Respiratory Status: Nasal cannula History of Recent Intubation: No Behavior/Cognition: Alert;Cooperative Oral Cavity Assessment: Within Functional Limits Oral Care Completed by SLP: Recent completion by staff Oral Cavity - Dentition: Adequate natural dentition Vision: Functional for self-feeding Self-Feeding Abilities: Needs assist Patient Positioning: Partially reclined Baseline Vocal Quality: Normal Volitional Cough: Strong Volitional Swallow: Able to elicit    Oral/Motor/Sensory Function Overall Oral Motor/Sensory Function: Moderate impairment Facial ROM: Reduced left;Suspected CN VII (facial) dysfunction Facial Symmetry: Abnormal symmetry left;Suspected CN VII (facial) dysfunction Facial Strength: Reduced left;Suspected CN VII (facial) dysfunction Facial Sensation: Reduced left;Suspected CN V (Trigeminal) dysfunction Lingual Symmetry: Abnormal symmetry left;Suspected CN XII (hypoglossal) dysfunction Lingual Strength: Reduced   Ice Chips Ice chips: Within functional limits   Thin Liquid Thin Liquid: Impaired Presentation: Straw;Cup;Self Fed Oral Phase Impairments: Reduced labial seal Oral Phase Functional Implications: Left anterior spillage    Nectar Thick Nectar Thick Liquid: Not tested   Honey Thick Honey Thick Liquid: Not tested   Puree Puree: Impaired Presentation: Self Fed Oral Phase Functional Implications: Left anterior spillage;Prolonged oral transit   Solid     Solid:  Not tested  Harry [name] 10/02/2020,12:46 PM  Harry Folks L. Samson Frederic, MA CCC/SLP Acute Rehabilitation Services Office number [phone] Pager [phone]

## 2020-10-02 NOTE — Progress Notes (Signed)
OT Cancellation Note  Patient Details Name: Harry [name] MRN: [id number] DOB: Aug 24, 1975   Cancelled Treatment:    Reason Eval/Treat Not Completed: (P) Active bedrest order. Will return as schedule allows.   Brittlyn Cloe M Krimson Massmann Dyer Klug MSOT, OTR/L Acute Rehab Pager: [phone] Office: [phone] 10/02/2020, 7:26 AM

## 2020-10-02 NOTE — Progress Notes (Signed)
Alerted Dr. Roda Shutters that the patient has returned from MRI.

## 2020-10-02 NOTE — Consult Note (Signed)
Physical Medicine and Rehabilitation Consult   Reason for Consult: Stroke with functional deficits.  Referring Physician:  Dr. Roda Shutters  HPI: Harry [name] is a 45 y.o. RH-male with history of heart murmur who was admitted on 10/01/2020 with acute onset of left hemiparesis with sensory deficits,  right gaze preference and N/V.  History taken from chart review, brother, and patient.  BP 192/128 at admission.  CT head unremarkable for acute intracranial process.   He received tPA and follow up CTA head neck showed right M2 branch occlusion supplying right parietal lobe. Thrombectomy recommended but patient declined this as family died on OR table. MRI brain done revealing acute infarcts in anterior and posterior right frontal lobe with edema and focal susceptibility right M1/M2 branch consistent with thrombus.  Echocardiogram with ejection fraction of 40-45%, with global hypokinesis and trivial pericardial effusion.  Hospital course further complicated by dysphagia, started on D1 thin liquid diet due to facial weakness with residue/spillage as well as deficits in awareness, judgment, and problem-solving.  Hospital course also complicated by fevers PT/OT evaluations completed revealing lethargy, left hemiparesis with instability on standing attempts, cognitive deficits affecting ability to process and follow multistep commands as well as visual deficits. CIR recommended due to functional decline.   Review of Systems  Constitutional: Positive for malaise/fatigue. Negative for chills and fever.  HENT: Negative for hearing loss and tinnitus.   Eyes: Negative for blurred vision.  Respiratory: Negative for cough, shortness of breath and stridor.   Cardiovascular: Negative for chest pain, palpitations and leg swelling.  Gastrointestinal: Positive for constipation. Negative for heartburn and nausea.  Genitourinary: Negative for dysuria and urgency.  Musculoskeletal: Negative for back pain, joint pain,  myalgias and neck pain.  Skin: Negative for itching and rash.  Neurological: Positive for speech change, focal weakness and weakness. Negative for dizziness, sensory change and headaches.  Psychiatric/Behavioral: Negative for memory loss. The patient does not have insomnia.   All other systems reviewed and are negative.    Past medical history: Heart murmur   No past surgical history   Family History  Problem Relation Age of Onset  . Cervical cancer Mother   . Stroke Father   . Heart Problems Sister        died from blood clot  . Diabetes Brother      Social History:  Lives with brother. Unemployed--sedentary and "lies around all day" per brother.  He denies use of tobacco, smokeless tobacco, alcohol or illicit drugs.    Allergies: No Known Allergies    No medications prior to admission.    Home: Home Living Family/patient expects to be discharged to:: Private residence Living Arrangements: Other relatives (Brother) Available Help at Discharge: Other (Comment) (Brother works from home) Type of Home: Apartment Home Access: Level entry Home Layout: One level Bathroom Shower/Tub: Tourist information centre manager: None  Functional History: Prior Function Level of Independence: Independent Comments: Does not work Functional Status:  Mobility: Bed Mobility Overal bed mobility: Needs Assistance Bed Mobility: Supine to Sit Supine to sit: Min assist,+2 for physical assistance General bed mobility comments: Assist for LLE negotiation edge of bed, light minA to execute trunk to upright sitting position Transfers Overall transfer level: Needs assistance Equipment used: 2 person hand held assist Transfers: Sit to/from [employer] Sit to Stand: Mod assist,+2 physical assistance Stand pivot transfers: Mod assist,+2 physical assistance General transfer comment: x2 transfers from bed <> chair going towards right side,  then left side  upon return. L knee blocked, noted knee buckle with weight shift. Cues for upright posture, hip extenssion, stepping initiation/sequencing.      ADL: ADL Overall ADL's : Needs assistance/impaired Eating/Feeding: Moderate assistance,Sitting,Bed level Grooming: Moderate assistance,Sitting,Bed level Grooming Details (indicate cue type and reason): Pt able to wipe with mouth with wash cloth while sitting at EOB with Min A; poor lip closure. Mod A for bilateral tasks Upper Body Bathing: Maximal assistance,Sitting,Bed level Lower Body Bathing: Maximal assistance,Sit to/from stand,Bed level Upper Body Dressing : Maximal assistance,Sitting,Bed level Lower Body Dressing: Total assistance,Sit to/from stand Toilet Transfer: Moderate assistance,+2 for physical assistance,+2 for safety/equipment,Stand-pivot Functional mobility during ADLs: Moderate assistance,+2 for physical assistance (stand pivot) General ADL Comments: Pt presenting with decreased functional sue of LUE, vision, cognition, balanc,e strength,. and activity [place]  Cognition: Cognition Overall Cognitive Status: Impaired/Different from baseline Arousal/Alertness: Awake/alert Orientation Level: Oriented X4 Attention: Sustained Sustained Attention: Appears intact Memory: Impaired Memory Impairment: Storage deficit,Retrieval deficit Awareness: Impaired Awareness Impairment: Intellectual impairment Problem Solving: Impaired Problem Solving Impairment: Verbal basic Behaviors: Impulsive Safety/Judgment: Impaired Cognition Arousal/Alertness: Lethargic Behavior During Therapy: Flat affect Overall Cognitive Status: Impaired/Different from baseline Area of Impairment: Attention,Memory,Following commands,Safety/judgement,Awareness,Problem solving Current Attention Level: Sustained Memory: Decreased short-term memory Following Commands: Follows one step commands consistently,Follows multi-step commands  inconsistently Safety/Judgement: Decreased awareness of safety,Decreased awareness of deficits Awareness: Emergent,Intellectual Problem Solving: Difficulty sequencing,Requires verbal cues General Comments: A&Ox4, pt very drowsy, keeping eyes closed throughout majority of session. Able to state he had left sided weakness, but seems to have decreased awareness of functional impact and safety. Follows 1 step commands consistently, needs cues to promote arousal and sustain attention to task.   Blood pressure (!) 168/106, pulse 92, temperature (!) 101 F (38.3 C), temperature source Oral, resp. rate 18, weight 59.6 kg, SpO2 97 %. Physical Exam Vitals and nursing note reviewed.  Constitutional:      Appearance: Normal appearance.  HENT:     Head: Normocephalic and atraumatic.     Right Ear: External ear normal.     Left Ear: External ear normal.     Nose: Nose normal.  Eyes:     General:        Right eye: No discharge.        Left eye: No discharge.     Extraocular Movements: Extraocular movements intact.  Cardiovascular:     Rate and Rhythm: Normal rate and regular rhythm.  Pulmonary:     Effort: Pulmonary effort is normal. No respiratory distress.     Breath sounds: No stridor.  Abdominal:     General: Abdomen is flat. Bowel sounds are normal. There is no distension.  Musculoskeletal:     Cervical back: Normal range of motion and neck supple.     Comments: Muscle wasting B/l LE.  No edema or tenderness in extremities  Skin:    General: Skin is dry.  Neurological:     Mental Status: He is alert.     Comments: Left facial weakness Left leg Alert and oriented x3 Motor: LUE/LLE: 0/5 proximal to distal RUE: 5/5 proximal to distal RLE: HF, KE 3+/5, ADF 4-/5 Dysarthria  Psychiatric:        Mood and Affect: Affect is blunt.        Speech: Speech is delayed.        Behavior: Behavior is slowed.     Results for orders placed or performed during the hospital encounter of 10/01/20  (from the past 24 hour(s))  CBC     Status: Abnormal   Collection Time: 10/03/20  5:06 AM  Result Value Ref Range   WBC 8.1 4.0 - 10.5 K/uL   RBC 3.48 (L) 4.22 - 5.81 MIL/uL   Hemoglobin 10.1 (L) 13.0 - 17.0 g/dL   HCT 16.130.2 (L) 09.639.0 - 04.552.0 %   MCV 86.8 80.0 - 100.0 fL   MCH 29.0 26.0 - 34.0 pg   MCHC 33.4 30.0 - 36.0 g/dL   RDW 40.913.2 81.111.5 - 91.415.5 %   Platelets 259 150 - 400 K/uL   nRBC 0.0 0.0 - 0.2 %  Basic metabolic panel     Status: Abnormal   Collection Time: 10/03/20  5:06 AM  Result Value Ref Range   Sodium 139 135 - 145 mmol/L   Potassium 3.1 (L) 3.5 - 5.1 mmol/L   Chloride 106 98 - 111 mmol/L   CO2 23 22 - 32 mmol/L   Glucose, Bld 93 70 - 99 mg/dL   BUN 12 6 - 20 mg/dL   Creatinine, Ser 7.821.14 0.61 - 1.24 mg/dL   Calcium 7.8 (L) 8.9 - 10.3 mg/dL   GFR, Estimated >95>60 >62>60 mL/min   Anion gap 10 5 - 15   MR BRAIN WO CONTRAST  Result Date: 10/02/2020 CLINICAL DATA:  Stroke follow-up.  Status post tPA. EXAM: MRI HEAD WITHOUT CONTRAST TECHNIQUE: Multiplanar, multiecho pulse sequences of the brain and surrounding structures were obtained without intravenous contrast. COMPARISON:  October 01, 2020 CT code stroke. FINDINGS: Brain: Acute infarcts in the anterior and posterior right frontal lobe involving cortex and subcortical white matter. Associated edema with sulcal effacement. No substantial midline shift. Mild curvilinear susceptibility artifact the regions of infarct likely represent petechial hemorrhage. No hydrocephalus. No mass lesion. 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 No extra-axial fluid collection. Basal cisterns are patent. Vascular: Focal susceptibility artifact within a right M2 branch in the region of the more posterior frontal lobe infarct, compatible with thrombus and known occlusion better characterized on recent CTA. Major arterial flow voids are maintained skull base. Skull and upper cervical spine: No focal marrow replacing lesion. Sinuses/Orbits:  Clear sinuses.  Unremarkable orbits. Other: Large right mastoid effusion. IMPRESSION: 1. Acute infarcts in the anterior and posterior right frontal lobe. Associated edema with sulcal effacement. Mild curvilinear susceptibility artifact the regions of infarct likely represent petechial hemorrhage. No mass occupying acute hemorrhage. 2. Focal susceptibility artifact within a right M2 MCA branch in the region of the more posterior frontal lobe infarct, compatible with thrombus and known occlusion better characterized on recent CTA. 3. 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 4. Large right mastoid effusion. Electronically Signed   By: Feliberto HartsFrederick S Jones MD   On: 10/02/2020 16:38   ECHOCARDIOGRAM COMPLETE  Result Date: 10/02/2020    ECHOCARDIOGRAM REPORT   Patient Name:   Bosie HelperLANORRIS P Polhamus Date of Exam: 10/02/2020 Medical Rec #:  [id number]         Height:       74.0 in Accession #:    [id number]        Weight:       131.4 lb Date of Birth:  09/08/1975          BSA:          1.818 m Patient Age:    44 years          BP:           168/104 mmHg Patient Gender: M  HR:           90 bpm. Exam Location:  Inpatient Procedure: 2D Echo Indications:    Stroke I63.9  History:        Patient has no prior history of Echocardiogram examinations.  Sonographer:    Thurman Coyer RDCS (AE) Referring Phys: [id number] Marvel Plan IMPRESSIONS  1. Left ventricular ejection fraction, by estimation, is 40 to 45%. The left ventricle has mildly decreased function. The left ventricle demonstrates global hypokinesis. There is mild concentric left ventricular hypertrophy. Left ventricular diastolic parameters are consistent with Grade I diastolic dysfunction (impaired relaxation). Elevated left atrial pressure.  2. Right ventricular systolic function is normal. The right ventricular size is normal.  3. The pericardial effusion is circumferential. There is no evidence of cardiac tamponade.  4. The mitral valve is  normal in structure. Trivial mitral valve regurgitation. No evidence of mitral stenosis.  5. The aortic valve is normal in structure. Aortic valve regurgitation is trivial. No aortic stenosis is present.  6. Aortic dilatation noted. There is mild dilatation of the aortic root, measuring 44 mm. There is mild dilatation of the ascending aorta, measuring 42 mm.  7. The inferior vena cava is normal in size with greater than 50% respiratory variability, suggesting right atrial pressure of 3 mmHg. FINDINGS  Left Ventricle: Left ventricular ejection fraction, by estimation, is 40 to 45%. The left ventricle has mildly decreased function. The left ventricle demonstrates global hypokinesis. The left ventricular internal cavity size was normal in size. There is  mild concentric left ventricular hypertrophy. Left ventricular diastolic parameters are consistent with Grade I diastolic dysfunction (impaired relaxation). Elevated left atrial pressure. Right Ventricle: The right ventricular size is normal. No increase in right ventricular wall thickness. Right ventricular systolic function is normal. Left Atrium: Left atrial size was normal in size. Right Atrium: Right atrial size was normal in size. Pericardium: Trivial pericardial effusion is present. The pericardial effusion is circumferential. There is no evidence of cardiac tamponade. Mitral Valve: The mitral valve is normal in structure. Trivial mitral valve regurgitation. No evidence of mitral valve stenosis. Tricuspid Valve: The tricuspid valve is normal in structure. Tricuspid valve regurgitation is not demonstrated. No evidence of tricuspid stenosis. Aortic Valve: The aortic valve is normal in structure. Aortic valve regurgitation is trivial. No aortic stenosis is present. Aortic valve mean gradient measures 3.0 mmHg. Aortic valve peak gradient measures 5.1 mmHg. Aortic valve area, by VTI measures 3.31 cm. Pulmonic Valve: The pulmonic valve was normal in structure.  Pulmonic valve regurgitation is not visualized. No evidence of pulmonic stenosis. Aorta: The aortic root is normal in size and structure and aortic dilatation noted. There is mild dilatation of the aortic root, measuring 44 mm. There is mild dilatation of the ascending aorta, measuring 42 mm. Venous: The inferior vena cava is normal in size with greater than 50% respiratory variability, suggesting right atrial pressure of 3 mmHg. IAS/Shunts: No atrial level shunt detected by color flow Doppler.  LEFT VENTRICLE PLAX 2D LVIDd:         4.60 cm  Diastology LVIDs:         3.40 cm  LV e' medial:   3.81 cm/s LV PW:         1.40 cm  LV E/e' medial: 21.4 LV IVS:        1.40 cm LVOT diam:     2.40 cm LV SV:         64 LV SV Index:  35 LVOT Area:     4.52 cm  RIGHT VENTRICLE TAPSE (M-mode): 2.2 cm LEFT ATRIUM           Index       RIGHT ATRIUM           Index LA diam:      2.00 cm 1.10 cm/m  RA Area:     16.00 cm LA Vol (A2C): 42.7 ml 23.49 ml/m RA Volume:   42.30 ml  23.27 ml/m LA Vol (A4C): 45.1 ml 24.81 ml/m  AORTIC VALVE AV Area (Vmax):    3.97 cm AV Area (Vmean):   3.91 cm AV Area (VTI):     3.31 cm AV Vmax:           113.00 cm/s AV Vmean:          82.900 cm/s AV VTI:            0.193 m AV Peak Grad:      5.1 mmHg AV Mean Grad:      3.0 mmHg LVOT Vmax:         99.20 cm/s LVOT Vmean:        71.700 cm/s LVOT VTI:          0.141 m LVOT/AV VTI ratio: 0.73  AORTA Ao Root diam: 4.40 cm Ao Asc diam:  3.90 cm MITRAL VALVE MV Area (PHT): 4.41 cm    SHUNTS MV Decel Time: 172 msec    Systemic VTI:  0.14 m MV E velocity: 81.50 cm/s  Systemic Diam: 2.40 cm MV A velocity: 85.30 cm/s MV E/A ratio:  0.96 Mihai Croitoru MD Electronically signed by Thurmon Fair MD Signature Date/Time: 10/02/2020/3:08:26 PM    Final    CT HEAD CODE STROKE WO CONTRAST  Result Date: 10/01/2020 CLINICAL DATA:  Code stroke. Acute neuro deficit. Rule out stroke. Left facial droop EXAM: CT HEAD WITHOUT CONTRAST TECHNIQUE: Contiguous axial images  were obtained from the base of the skull through the vertex without intravenous contrast. COMPARISON:  None. FINDINGS: Brain: Ventricle size and cerebral volume normal. Well-defined hypodensity left thalamus compatible with chronic infarction. Negative for acute infarct, hemorrhage, mass Vascular: Negative for hyperdense vessel Skull: Negative Sinuses/Orbits: Mild mucosal edema paranasal sinuses. Large right mastoid effusion and middle ear effusion. Left mastoid sinus clear. Other: None ASPECTS ([place] Stroke Program Early CT Score) - Ganglionic level infarction (caudate, lentiform nuclei, internal capsule, insula, M1-M3 cortex): 7 - Supraganglionic infarction (M4-M6 cortex): 3 Total score (0-10 with 10 being normal): 10 IMPRESSION: 1. No acute intracranial abnormality 2. ASPECTS is 10 3. Code stroke imaging results were communicated on 10/01/2020 at 6:19 pm to provider Roda Shutters via text page Electronically Signed   By: Marlan Palau M.D.   On: 10/01/2020 18:20   VAS [place] LOWER EXTREMITY VENOUS (DVT)  Result Date: 10/03/2020  Lower Venous DVT Study Indications: Stroke.  Comparison Study: no prior Performing Technologist: Blanch Media RVS  Examination Guidelines: A complete evaluation includes B-mode imaging, spectral Doppler, color Doppler, and power Doppler as needed of all accessible portions of each vessel. Bilateral testing is considered an integral part of a complete examination. Limited examinations for reoccurring indications may be performed as noted. The reflux portion of the exam is performed with the patient in reverse Trendelenburg.  +---------+---------------+---------+-----------+----------+--------------+ RIGHT    CompressibilityPhasicitySpontaneityPropertiesThrombus Aging +---------+---------------+---------+-----------+----------+--------------+ CFV      Full           Yes      Yes                                 +---------+---------------+---------+-----------+----------+--------------+  SFJ      Full                                                        +---------+---------------+---------+-----------+----------+--------------+ FV Prox  Full                                                        +---------+---------------+---------+-----------+----------+--------------+ FV Mid   Full                                                        +---------+---------------+---------+-----------+----------+--------------+ FV DistalFull                                                        +---------+---------------+---------+-----------+----------+--------------+ PFV      Full                                                        +---------+---------------+---------+-----------+----------+--------------+ POP      Full           Yes      Yes                                 +---------+---------------+---------+-----------+----------+--------------+ PTV      Full                                                        +---------+---------------+---------+-----------+----------+--------------+ PERO     Full                                                        +---------+---------------+---------+-----------+----------+--------------+   +---------+---------------+---------+-----------+----------+--------------+ LEFT     CompressibilityPhasicitySpontaneityPropertiesThrombus Aging +---------+---------------+---------+-----------+----------+--------------+ CFV      Full           Yes      Yes                                 +---------+---------------+---------+-----------+----------+--------------+ SFJ      Full                                                        +---------+---------------+---------+-----------+----------+--------------+  FV Prox  Full                                                        +---------+---------------+---------+-----------+----------+--------------+ FV Mid   Full                                                         +---------+---------------+---------+-----------+----------+--------------+ FV DistalFull                                                        +---------+---------------+---------+-----------+----------+--------------+ PFV      Full                                                        +---------+---------------+---------+-----------+----------+--------------+ POP      Full           Yes      Yes                                 +---------+---------------+---------+-----------+----------+--------------+ PTV      Full                                                        +---------+---------------+---------+-----------+----------+--------------+ PERO     Full                                                        +---------+---------------+---------+-----------+----------+--------------+     Summary: BILATERAL: - No evidence of deep vein thrombosis seen in the lower extremities, bilaterally. - No evidence of superficial venous thrombosis in the lower extremities, bilaterally. -No evidence of popliteal cyst, bilaterally.   *See table(s) above for measurements and observations.    Preliminary    CT ANGIO HEAD NECK W WO CM (CODE STROKE)  Addendum Date: 10/01/2020   ADDENDUM REPORT: 10/01/2020 20:03 ADDENDUM: After further discussion with Dr. 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 Electronically Signed   By: Marlan Palau M.D.   On: 10/01/2020 20:03   Result Date: 10/01/2020 CLINICAL DATA:  Left facial droop EXAM: CT HEAD WITHOUT CONTRAST CT ANGIOGRAPHY HEAD AND NECK TECHNIQUE: Multidetector CT imaging of the head and neck was performed using the standard protocol during bolus administration of intravenous contrast. Multiplanar CT image reconstructions and MIPs were obtained to evaluate the vascular anatomy. Carotid stenosis measurements (when applicable) are obtained utilizing NASCET criteria, using the distal  internal  carotid diameter as the denominator. CONTRAST:  30mL OMNIPAQUE IOHEXOL 350 MG/ML SOLN COMPARISON:  CT head 10/01/2020 FINDINGS: CTA NECK FINDINGS Aortic arch: Standard branching. Imaged portion shows no evidence of aneurysm or dissection. No significant stenosis of the major arch vessel origins. Right carotid system: Normal right carotid. Negative for atherosclerotic disease or stenosis. Left carotid system: Normal left carotid. Negative for atherosclerotic disease or stenosis. Vertebral arteries: Both vertebral arteries widely patent without stenosis. Skeleton: Cervicothoracic scoliosis. No acute skeletal abnormality. Multiple caries. Periapical lucency around right lower molar. Other neck: No mass or adenopathy. 12 mm left thyroid nodule. No further imaging necessary. Upper chest: Lung apices clear bilaterally. Review of the MIP images confirms the above findings CTA HEAD FINDINGS Anterior circulation: Anterior and middle cerebral arteries normal bilaterally. No stenosis or large vessel occlusion. Posterior circulation: Normal posterior circulation. No stenosis or large vessel occlusion. Venous sinuses: Normal venous enhancement Anatomic variants: None Delayed phase: Not performed Review of the MIP images confirms the above findings IMPRESSION: Normal CT angiogram head and neck. No significant atherosclerotic disease. No stenosis or intracranial large vessel occlusion. Electronically Signed: By: Marlan Palau M.D. On: 10/01/2020 19:16    Assessment/Plan: Diagnosis: Acute infarcts in anterior and posterior right frontal lobe  Labs independently reviewed.  Records reviewed and summated above.  1. Does the need for close, 24 hr/day medical supervision in concert with the patient's rehab needs make it unreasonable for this patient to be served in a less intensive setting? Yes  2. Co-Morbidities requiring supervision/potential complications: heart murmur, HTN (monitor and provide prns in accordance  with increased physical exertion and pain), FUO (repeat labs, cont to monitor for signs and symptoms of infection, further workup if indicated), hypokalemia (continue to monitor and replete as necessary) 3. Due to safety, disease management and patient education, does the patient require 24 hr/day rehab nursing? Yes 4. Does the patient require coordinated care of a physician, rehab nurse, therapy disciplines of PT/OT/SLP to address physical and functional deficits in the context of the above medical diagnosis(es)? Yes Addressing deficits in the following areas: balance, endurance, locomotion, strength, transferring, bathing, dressing, toileting and psychosocial support 5. Can the patient actively participate in an intensive therapy program of at least 3 hrs of therapy per day at least 5 days per week? Yes 6. The potential for patient to make measurable gains while on inpatient rehab is excellent 7. Anticipated functional outcomes upon discharge from inpatient rehab are supervision and min assist  with PT, supervision and min assist with OT, modified independent and supervision with SLP. 8. Estimated rehab length of stay to reach the above functional goals is: 14-17 days. 9. Anticipated discharge destination: Other 10. Overall Rehab/Functional Prognosis: good  RECOMMENDATIONS: This patient's condition is appropriate for continued rehabilitative care in the following setting: Patient will require caregiver support at discharge, which does not appear to be available at present.  Recommend SNF at this time if available.  Will consider CIR if caregiver support available upon discharge.  Patient has agreed to participate in recommended program. Yes Note that insurance prior authorization may be required for reimbursement for recommended care.  Comment: Rehab Admissions Coordinator to follow up.  I have personally performed a face to face diagnostic evaluation, including, but not limited to relevant  history and physical exam findings, of this patient and developed relevant assessment and plan.  Additionally, I have reviewed and concur with the physician assistant's documentation above.   Maryla Morrow, MD, ABPMR Jacquelynn Cree, PA-C  10/03/2020 

## 2020-10-02 NOTE — Evaluation (Signed)
Occupational Therapy Evaluation Patient Details Name: Harry [name] MRN: [id number] DOB: 12/06/1975 Today's Date: 10/02/2020    History of Present Illness 45 yo male presenting to ED with left sided weakness. CT showing early ischemic changes around right MCA but no hypodensity. tPA given on 4/18 @1823 . 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 Awaiting MRI. PMH including heart murmur not on medications.   Clinical Impression   PTA, pt was living with his brother and was independent. Pt currently requiring Max A for UB ADLs, Max-Total A for LB ADLs, and Mod A +2 for functional transfers. Pt presenting with decreased functional use of LUE, vision, cognition, balance, strength, and activity tolerance. Pt also with decreased arousal and closing his eye frequently during session. Despite fatigue, pt very motivated to participate in therapy and eager to follow commands. Brother very supportive and present. Pt would benefit from further acute OT to facilitate safe dc. Recommend dc to CIR for further OT to optimize safety, independence with ADLs, and return to PLOF.     Follow Up Recommendations  CIR    Equipment Recommendations  Other (comment) (Defer to next venue)    Recommendations for Other Services Rehab consult;PT consult;Speech consult     Precautions / Restrictions Precautions Precautions: Fall;Other (comment) Precaution Comments: LUE hemiparesis Restrictions Weight Bearing Restrictions: No      Mobility Bed Mobility Overal bed mobility: Needs Assistance Bed Mobility: Supine to Sit     Supine to sit: Min assist;+2 for physical assistance     General bed mobility comments: Assist for LLE negotiation edge of bed, light minA to execute trunk to upright sitting position    Transfers Overall transfer level: Needs assistance Equipment used: 2 person hand held assist Transfers: Sit to/from Sit to Stand: Mod assist;+2  physical assistance Stand pivot transfers: Mod assist;+2 physical assistance       General transfer comment: x2 transfers from bed <> chair going towards right side, then left side upon return. L knee blocked, noted knee buckle with weight shift. Cues for upright posture, hip extenssion, stepping initiation/sequencing.    Balance Overall balance assessment: Needs assistance Sitting-balance support: No upper extremity supported;Feet supported Sitting balance-Leahy Scale: Fair Sitting balance - Comments: Supervision for safety   Standing balance support: Bilateral upper extremity supported;During functional activity Standing balance-Leahy Scale: Poor                             ADL either performed or assessed with clinical judgement   ADL Overall ADL's : Needs assistance/impaired Eating/Feeding: Moderate assistance;Sitting;Bed level   Grooming: Moderate assistance;Sitting;Bed level Grooming Details (indicate cue type and reason): Pt able to wipe with mouth with wash cloth while sitting at EOB with Min A; poor lip closure. Mod A for bilateral tasks Upper Body Bathing: Maximal assistance;Sitting;Bed level   Lower Body Bathing: Maximal assistance;Sit to/from stand;Bed level   Upper Body Dressing : Maximal assistance;Sitting;Bed level   Lower Body Dressing: Total assistance;Sit to/from stand   Toilet Transfer: Moderate assistance;+2 for physical assistance;+2 for safety/equipment;Stand-pivot           Functional mobility during ADLs: Moderate assistance;+2 for physical assistance (stand pivot) General ADL Comments: Pt presenting with decreased functional sue of LUE, vision, cognition, balanc,e strength,. and activity toelrance     Vision Baseline Vision/History: No visual deficits Vision Assessment?: Yes;Vision impaired- to be further tested in functional context Eye Alignment: Within Functional Limits  Tracking/Visual Pursuits: Decreased smoothness of eye movement  to LEFT inferior field;Decreased smoothness of eye movement to LEFT superior field;Unable to hold eye position out of midline Additional Comments: Pt with decreased visual attention to L. Difficulty sustaining gaze and tracking to left     Perception     Praxis      Pertinent Vitals/Pain Pain Assessment: No/denies pain     Hand Dominance Right   Extremity/Trunk Assessment Upper Extremity Assessment Upper Extremity Assessment: LUE deficits/detail LUE Deficits / Details: Flaccid. No withdrawl from painful stimuli. edema LUE Sensation: decreased light touch LUE Coordination: decreased fine motor;decreased gross motor   Lower Extremity Assessment Lower Extremity Assessment: Defer to PT evaluation RLE Deficits / Details: WFL LLE Deficits / Details: Able to perform limited heel slide, no active ankle dorsiflexion noted   Cervical / Trunk Assessment Cervical / Trunk Assessment: Normal   Communication Communication Communication: Other (comment) (Mumbling and stating "ok" for majoprity of information)   Cognition Arousal/Alertness: Lethargic Behavior During Therapy: Flat affect Overall Cognitive Status: Impaired/Different from baseline Area of Impairment: Attention;Memory;Following commands;Safety/judgement;Awareness;Problem solving                   Current Attention Level: Sustained Memory: Decreased short-term memory Following Commands: Follows one step commands consistently;Follows multi-step commands inconsistently Safety/Judgement: Decreased awareness of safety;Decreased awareness of deficits Awareness: Emergent;Intellectual Problem Solving: Difficulty sequencing;Requires verbal cues General Comments: A&Ox4, pt very drowsy, keeping eyes closed throughout majority of session. Able to state he had left sided weakness, but seems to have decreased awareness of functional impact and safety. Follows 1 step commands consistently, needs cues to promote arousal and sustain  attention to task.   General Comments  Brother present throughout session. HR 80s. SpO2 98% on RA. BP stable.    Exercises     Shoulder Instructions      Home Living Family/patient expects to be discharged to:: Private residence Living Arrangements: Other relatives (Brother) Available Help at Discharge: Other (Comment) (Brother works from home) Type of Home: Apartment Home Access: Level entry     Home Layout: One level     Bathroom Shower/Tub: Chief Strategy Officer: Standard     Home Equipment: None          Prior Functioning/Environment Level of Independence: Independent        Comments: Does not work        OT Problem List: Decreased strength;Decreased range of motion;Decreased activity tolerance;Impaired balance (sitting and/or standing);Decreased safety awareness;Decreased knowledge of use of DME or AE;Decreased knowledge of precautions;Impaired UE functional use;Increased edema;Impaired sensation;Decreased cognition;Decreased coordination;Impaired vision/perception      OT Treatment/Interventions: Self-care/ADL training;Therapeutic exercise;Energy conservation;DME and/or AE instruction;Therapeutic activities;Patient/family education;Balance training    OT Goals(Current goals can be found in the care plan section) Acute Rehab OT Goals Patient Stated Goal: Interested in rehab before home OT Goal Formulation: With patient/family Time For Goal Achievement: 10/16/20 Potential to Achieve Goals: Good  OT Frequency: Min 2X/week   Barriers to D/C:            Co-evaluation PT/OT/SLP Co-Evaluation/Treatment: Yes Reason for Co-Treatment: For patient/therapist safety;To address functional/ADL transfers PT goals addressed during session: Mobility/safety with mobility OT goals addressed during session: ADL's and self-care      AM-PAC OT "6 Clicks" Daily Activity     Outcome Measure Help from another person eating meals?: A Lot Help from another  person taking care of personal grooming?: A Lot Help from another person toileting, which includes using toliet,  bedpan, or urinal?: A Lot Help from another person bathing (including washing, rinsing, drying)?: A Lot Help from another person to put on and taking off regular upper body clothing?: A Lot Help from another person to put on and taking off regular lower body clothing?: Total 6 Click Score: 11   End of Session Equipment Utilized During Treatment: Gait belt Nurse Communication: Mobility status  Activity Tolerance: Patient tolerated treatment well Patient left: in bed;with call bell/phone within reach;with bed alarm set (Returned to bed after chair due to ECHO arriving)  OT Visit Diagnosis: Unsteadiness on feet (R26.81);Other abnormalities of gait and mobility (R26.89);Muscle weakness (generalized) (M62.81);Hemiplegia and hemiparesis Hemiplegia - Right/Left: Left Hemiplegia - dominant/non-dominant: Non-Dominant Hemiplegia - caused by: Cerebral infarction                Time: 1331-1356 OT Time Calculation (min): 25 min Charges:  OT General Charges $OT Visit: 1 Visit OT Evaluation $OT Eval Moderate Complexity: 1 Mod  Brinson Tozzi MSOT, OTR/L Acute Rehab Pager: [phone] Office: [phone]  Theodoro Grist Jaryan Chicoine 10/02/2020, 5:31 PM

## 2020-10-02 NOTE — Progress Notes (Signed)
  Echocardiogram 2D Echocardiogram has been performed.  Harry [name] 10/02/2020, 2:49 PM

## 2020-10-02 NOTE — Plan of Care (Signed)
Pt resting easily, responds to voice. Pt without complaints at this time. Pt understanding of current diagnosis, continuing education done verbally.

## 2020-10-02 NOTE — Evaluation (Signed)
Physical Therapy Evaluation Patient Details Name: Harry [name] MRN: [id number] DOB: [birth year] Today's Date: 10/02/2020   History of Present Illness  45 yo male presenting to ED with left sided weakness. CT showing early ischemic changes around right MCA but no hypodensity. tPA given on 4/18 @1823 . 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 Awaiting MRI. PMH including heart murmur not on medications.  Clinical Impression  Prior to admission, pt lives with his brother in a level entry apartment and is independent (does not work). Pt drowsy, but able to arouse with minimal stimulus. Presents with LUE flaccidity, LLE weakness, poor standing balance, decreased cognition, R gaze preference. Requiring two person minimal assist for stand pivot transfers from bed to and from chair (returned to bed due to echo arriving). Suspect excellent progress given age, PLOF and motivation. Highly recommend CIR to address deficits and maximize functional independence.     Follow Up Recommendations CIR    Equipment Recommendations  3in1 (PT);Wheelchair cushion (measurements PT);Wheelchair (measurements PT)    Recommendations for Other Services       Precautions / Restrictions Precautions Precautions: Fall;Other (comment) Precaution Comments: LUE hemiparesis Restrictions Weight Bearing Restrictions: No      Mobility  Bed Mobility Overal bed mobility: Needs Assistance Bed Mobility: Supine to Sit     Supine to sit: Min assist;+2 for physical assistance     General bed mobility comments: Assist for LLE negotiation edge of bed, light minA to execute trunk to upright sitting position    Transfers Overall transfer level: Needs assistance Equipment used: 2 person hand held assist Transfers: Sit to/from Transfers Sit to Stand: Mod assist;+2 physical assistance Stand pivot transfers: Mod assist;+2 physical assistance       General transfer comment: x2  transfers from bed <> chair going towards right side, then left side upon return. L knee blocked, noted knee buckle with weight shift. Cues for upright posture, hip extenssion, stepping initiation/sequencing.  Ambulation/Gait                Stairs            Wheelchair Mobility    Modified Rankin (Stroke Patients Only) Modified Rankin (Stroke Patients Only) Pre-Morbid Rankin Score: No symptoms Modified Rankin: Moderately severe disability     Balance Overall balance assessment: Needs assistance Sitting-balance support: No upper extremity supported;Feet supported Sitting balance-Leahy Scale: Fair Sitting balance - Comments: Supervision for safety   Standing balance support: Bilateral upper extremity supported;During functional activity Standing balance-Leahy Scale: Poor                               Pertinent Vitals/Pain Pain Assessment: No/denies pain    Home Living Family/patient expects to be discharged to:: Private residence Living Arrangements: Other relatives (Brother) Available Help at Discharge: Other (Comment) (Brother works from home) Type of Home: Apartment Home Access: Level entry     Home Layout: One level Home Equipment: None      Prior Function Level of Independence: Independent         Comments: Does not work     [phone]   Dominant Hand: Right    Extremity/Trunk Assessment   Upper Extremity Assessment Upper Extremity Assessment: Defer to OT evaluation    Lower Extremity Assessment Lower Extremity Assessment: RLE deficits/detail;LLE deficits/detail RLE Deficits / Details: WFL LLE Deficits / Details: Able to perform limited heel slide, no active ankle dorsiflexion  noted    Cervical / Trunk Assessment Cervical / Trunk Assessment: Normal  Communication   Communication: Other (comment) (Mumbling and stating "ok" for majoprity of information)  Cognition Arousal/Alertness: Lethargic Behavior During Therapy:  Flat affect Overall Cognitive Status: Impaired/Different from baseline Area of Impairment: Attention;Memory;Following commands;Safety/judgement;Awareness;Problem solving                   Current Attention Level: Sustained Memory: Decreased short-term memory Following Commands: Follows one step commands consistently;Follows multi-step commands inconsistently Safety/Judgement: Decreased awareness of safety;Decreased awareness of deficits Awareness: Emergent;Intellectual Problem Solving: Difficulty sequencing;Requires verbal cues General Comments: A&Ox4, pt very drowsy, keeping eyes closed throughout majority of session. Able to state he had left sided weakness, but seems to have decreased awareness of functional impact and safety. Follows 1 step commands consistently, needs cues to promote arousal and sustain attention to task.      General Comments General comments (skin integrity, edema, etc.): HR 80s. SpO2 98% on RA. BP stable.    Exercises     Assessment/Plan    PT Assessment Patient needs continued PT services  PT Problem List Decreased strength;Decreased activity tolerance;Decreased balance;Decreased mobility;Decreased cognition;Decreased safety awareness;Impaired sensation       PT Treatment Interventions DME instruction;Functional mobility training;Therapeutic activities;Therapeutic exercise;Balance training;Gait training;Patient/family education    PT Goals (Current goals can be found in the Care Plan section)  Acute Rehab PT Goals Patient Stated Goal: pt brother interested in rehab PT Goal Formulation: With patient/family Time For Goal Achievement: 10/16/20 Potential to Achieve Goals: Good    Frequency Min 4X/week   Barriers to discharge        Co-evaluation PT/OT/SLP Co-Evaluation/Treatment: Yes Reason for Co-Treatment: For patient/therapist safety;To address functional/ADL transfers;Complexity of the patient's impairments (multi-system involvement) PT  goals addressed during session: Mobility/safety with mobility         AM-PAC PT "6 Clicks" Mobility  Outcome Measure Help needed turning from your back to your side while in a flat bed without using bedrails?: A Little Help needed moving from lying on your back to sitting on the side of a flat bed without using bedrails?: A Little Help needed moving to and from a bed to a chair (including a wheelchair)?: A Lot Help needed standing up from a chair using your arms (e.g., wheelchair or bedside chair)?: A Lot Help needed to walk in hospital room?: A Lot Help needed climbing 3-5 steps with a railing? : Total 6 Click Score: 13    End of Session Equipment Utilized During Treatment: Gait belt Activity Tolerance: Patient tolerated treatment well Patient left: in bed;with call bell/phone within reach;with bed alarm set Nurse Communication: Mobility status PT Visit Diagnosis: Unsteadiness on feet (R26.81);Other abnormalities of gait and mobility (R26.89);Difficulty in walking, not elsewhere classified (R26.2);Hemiplegia and hemiparesis Hemiplegia - Right/Left: Left Hemiplegia - dominant/non-dominant: Non-dominant Hemiplegia - caused by: Cerebral infarction    Time: 1331-1355 PT Time Calculation (min) (ACUTE ONLY): 24 min   Charges:   PT Evaluation $PT Eval Moderate Complexity: 1 Mod          Harry [name], PT, DPT Acute Rehabilitation Services Pager [phone] Office [phone]   Harry [name] 10/02/2020, 3:31 PM

## 2020-10-02 NOTE — Progress Notes (Signed)
STROKE TEAM PROGRESS NOTE   SUBJECTIVE (INTERVAL HISTORY) His brother is at the bedside.  Overall his condition is stable.  Patient still lethargic, has right gaze preference, left arm flaccid, left lower extremity against gravity but drift.  Still has sensory loss and neglect on the left.  Pending swallow and PT/OT recommend CIR.  MRI pending.   OBJECTIVE Temp:  [98.4 F (36.9 C)-99.8 F (37.7 C)] 98.4 F (36.9 C) (04/19 0800) Pulse Rate:  [71-108] 90 (04/19 1035) Resp:  [0-17] 0 (04/19 1035) BP: (92-181)/(52-114) 168/104 (04/19 1035) SpO2:  [89 %-100 %] 100 % (04/19 1035) Weight:  [59.6 kg] 59.6 kg (04/18 1815)  No results for input(s): GLUCAP in the last 168 hours. Recent Labs  Lab 10/01/20 1805 10/01/20 1823  NA 138 137  K 3.6 3.6  CL 105 105  CO2 21*  --   GLUCOSE 170* 170*  BUN 12 14  CREATININE 0.92 0.80  CALCIUM 8.4*  --    Recent Labs  Lab 10/01/20 1805  AST 24  ALT 11  ALKPHOS 44  BILITOT 0.9  PROT 5.0*  ALBUMIN 2.1*   Recent Labs  Lab 10/01/20 1805 10/01/20 1823  WBC 10.9*  --   NEUTROABS 8.4*  --   HGB 10.7* 9.5*  HCT 32.6* 28.0*  MCV 88.3  --   PLT 347  --    No results for input(s): CKTOTAL, CKMB, CKMBINDEX, TROPONINI in the last 168 hours. Recent Labs    10/01/20 1805  LABPROT 13.8  INR 1.1   No results for input(s): COLORURINE, LABSPEC, PHURINE, GLUCOSEU, HGBUR, BILIRUBINUR, KETONESUR, PROTEINUR, UROBILINOGEN, NITRITE, LEUKOCYTESUR in the last 72 hours.  Invalid input(s): APPERANCEUR     Component Value Date/Time   CHOL 297 (H) 10/02/2020 0607   TRIG 62 10/02/2020 0607   HDL 73 10/02/2020 0607   CHOLHDL 4.1 10/02/2020 0607   VLDL 12 10/02/2020 0607   LDLCALC 212 (H) 10/02/2020 0607   Lab Results  Component Value Date   HGBA1C 5.3 10/02/2020   No results found for: LABOPIA, COCAINSCRNUR, LABBENZ, AMPHETMU, THCU, LABBARB  No results for input(s): ETH in the last 168 hours.  I have personally reviewed the radiological images  below and agree with the radiology interpretations.  CT HEAD CODE STROKE WO CONTRAST  Result Date: 10/01/2020 CLINICAL DATA:  Code stroke. Acute neuro deficit. Rule out stroke. Left facial droop EXAM: CT HEAD WITHOUT CONTRAST TECHNIQUE: Contiguous axial images were obtained from the base of the skull through the vertex without intravenous contrast. COMPARISON:  None. FINDINGS: Brain: Ventricle size and cerebral volume normal. Well-defined hypodensity left thalamus compatible with chronic infarction. Negative for acute infarct, hemorrhage, mass Vascular: Negative for hyperdense vessel Skull: Negative Sinuses/Orbits: Mild mucosal edema paranasal sinuses. Large right mastoid effusion and middle ear effusion. Left mastoid sinus clear. Other: None ASPECTS (Alberta Stroke Program Early CT Score) - Ganglionic level infarction (caudate, lentiform nuclei, internal capsule, insula, M1-M3 cortex): 7 - Supraganglionic infarction (M4-M6 cortex): 3 Total score (0-10 with 10 being normal): 10 IMPRESSION: 1. No acute intracranial abnormality 2. ASPECTS is 10 3. Code stroke imaging results were communicated on 10/01/2020 at 6:19 pm to provider Roda Shutters via text page Electronically Signed   By: Marlan Palau M.D.   On: 10/01/2020 18:20   CT ANGIO HEAD NECK W WO CM (CODE STROKE)  Addendum Date: 10/01/2020   ADDENDUM REPORT: 10/01/2020 20:03 ADDENDUM: After further discussion with Dr. Roda Shutters, there is a right M2 branch occlusion supplying the right  parietal lobe. Electronically Signed   By: Marlan Palau M.D.   On: 10/01/2020 20:03   Result Date: 10/01/2020 CLINICAL DATA:  Left facial droop EXAM: CT HEAD WITHOUT CONTRAST CT ANGIOGRAPHY HEAD AND NECK TECHNIQUE: Multidetector CT imaging of the head and neck was performed using the standard protocol during bolus administration of intravenous contrast. Multiplanar CT image reconstructions and MIPs were obtained to evaluate the vascular anatomy. Carotid stenosis measurements (when  applicable) are obtained utilizing NASCET criteria, using the distal internal carotid diameter as the denominator. CONTRAST:  33mL OMNIPAQUE IOHEXOL 350 MG/ML SOLN COMPARISON:  CT head 10/01/2020 FINDINGS: CTA NECK FINDINGS Aortic arch: Standard branching. Imaged portion shows no evidence of aneurysm or dissection. No significant stenosis of the major arch vessel origins. Right carotid system: Normal right carotid. Negative for atherosclerotic disease or stenosis. Left carotid system: Normal left carotid. Negative for atherosclerotic disease or stenosis. Vertebral arteries: Both vertebral arteries widely patent without stenosis. Skeleton: Cervicothoracic scoliosis. No acute skeletal abnormality. Multiple caries. Periapical lucency around right lower molar. Other neck: No mass or adenopathy. 12 mm left thyroid nodule. No further imaging necessary. Upper chest: Lung apices clear bilaterally. Review of the MIP images confirms the above findings CTA HEAD FINDINGS Anterior circulation: Anterior and middle cerebral arteries normal bilaterally. No stenosis or large vessel occlusion. Posterior circulation: Normal posterior circulation. No stenosis or large vessel occlusion. Venous sinuses: Normal venous enhancement Anatomic variants: None Delayed phase: Not performed Review of the MIP images confirms the above findings IMPRESSION: Normal CT angiogram head and neck. No significant atherosclerotic disease. No stenosis or intracranial large vessel occlusion. Electronically Signed: By: Marlan Palau M.D. On: 10/01/2020 19:16    PHYSICAL EXAM  Temp:  [98.4 F (36.9 C)-99.8 F (37.7 C)] 98.4 F (36.9 C) (04/19 0800) Pulse Rate:  [71-108] 90 (04/19 1035) Resp:  [0-17] 0 (04/19 1035) BP: (92-181)/(52-114) 168/104 (04/19 1035) SpO2:  [89 %-100 %] 100 % (04/19 1035) Weight:  [59.6 kg] 59.6 kg (04/18 1815)  General - Well nourished, well developed, lethargic.  Ophthalmologic - fundi not visualized due to  noncooperation.  Cardiovascular - Regular rhythm and rate.  Neuro - lethargic, but eyes open on voice, need intermittent stimulation to keep awake though, orientated to age, place, time and people. No aphasia, fluent language, following all simple commands. Able to name and repeat.  Mild dysarthria.  Right gaze preference, however able to have complete left gaze, visual field full no hemianopia, however left lower quadrant simultanagnosia, PERRL.  Left facial droop. Tongue protrusion to the left.  Right upper and lower extremity 4/5, left upper extremity flaccid, proximal 0/5 but bicep 3-/5 and tricep 2/5 with finger grip 0/5, left lower extremity 3-/5 with drift and distal PF/DF 0/5.  Sensation subjectively symmetrical, however left sensory neglect.  Right finger-to-nose intact.  Gait not tested.    ASSESSMENT/PLAN Mr. NELSON NOONE is a 45 y.o. male with PMH of heart murmur not on medications presented to ER for acute onset left arm and leg weakness, left visual and sensory neglect, right gaze preference, left facial droop nausea/vomiting. tPA given.  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  Discussed with patient regarding risk and benefit of thrombectomy, patient declined thrombectomy.     Stroke:  left MCA infarct, embolic pattern, source unclear   CT head early ischemic changes right MCA but no hypodensity  CTA head neck was done showed right M2/M3 occlusion.   MRI right moderate MCA cortical infarcts  2D Echo EF 40 to 45%  LE venous Doppler pending  Recommend TEE for further embolic work-up  LDL 212  HgbA1c 5.3  UDS pending  Lovenox for VTE prophylaxis  No antithrombotic prior to admission, now on aspirin 325 mg daily and clopidogrel 75 mg daily DAPT for 3 months and then aspirin alone given large vessel occlusion intracranially.  Patient counseled to be compliant with his antithrombotic medications  Ongoing aggressive stroke risk factor  management  Therapy recommendations: CIR  Disposition: Pending  Cardiomyopathy  EF 40 to 45%  TEE pending  May consider cardiology consult  Hypertensive urgency . Needed labetalol and Cleviprex IV before IV tPA for BP control . Stable now . Off Cleviprex . Permissive hypertension (OK if <180/105) for 24-48 hours post stroke and then gradually normalized within 5-7 days.  Long term BP goal normotensive  Hyperlipidemia  Home meds: None  LDL 212, goal < 70  Now on Lipitor 80  Continue statin at discharge  Other Stroke Risk Factors  Hx of heart murmur  Other Active Problems  Leukocytosis WBC 10.9  Hospital day # 1  This patient is critically ill due to left MCA stroke, status post tPA and at significant risk of neurological worsening, death form stroke extension, hemorrhagic conversion, bleeding from tPA. This patient's care requires constant monitoring of vital signs, hemodynamics, respiratory and cardiac monitoring, review of multiple databases, neurological assessment, discussion with family, other specialists and medical decision making of high complexity. I spent 40 minutes of neurocritical care time in the care of this patient. I had long discussion with brother and the patient at bedside, updated pt current condition, treatment plan and potential prognosis, and answered all the questions.  They expressed understanding and appreciation.    Marvel Plan, MD PhD Stroke Neurology 10/02/2020 11:38 AM    To contact Stroke Continuity provider, please refer to [email or web address]. After hours, contact General Neurology

## 2020-10-02 NOTE — Evaluation (Signed)
Speech Language Pathology Evaluation Patient Details Name: Harry [name] MRN: [id number] DOB: October 09, 1975 Today's Date: 10/02/2020 Time: 0630-1601 SLP Time Calculation (min) (ACUTE ONLY): 11 min  Problem List:  Patient Active Problem List   Diagnosis Date Noted  . Stroke (cerebrum) (HCC) 10/01/2020   Past Medical History: History reviewed. No pertinent past medical history. Past Surgical History: History reviewed. No pertinent surgical history. HPI:  45 y.o. male with PMH of heart murmur not on medications presented to ER 4/18 for acute onset left arm and leg weakness, left visual and sensory neglect, right gaze preference, left facial droop nausea/vomiting. CT head showed early ischemic changes right MCA but no hypodensity; CTA right M2/M3 occlusion.  tPA initiated. Pt declined thrombectomy.   Assessment / Plan / Recommendation Clinical Impression  Pt presents with a right hemisphere communication disorder marked by flat affect, impulsivity in speech and behavior, deficits in selective attention, deficits in storage/retrieval for short-term recall (improved with effort and deliberate attention). Impaired awareness/judgment/problem-solving noted. Speech is mildly dysarthric. There is moderate CN involvement specific to CN V,VII, and XII. Pt will benefit from SLP intervention while in acute care, as well as CIR consult.  Will follow.    SLP Assessment  SLP Recommendation/Assessment: Patient needs continued Speech Lanaguage Pathology Services SLP Visit Diagnosis: Cognitive communication deficit (R41.841)    Follow Up Recommendations  Inpatient Rehab    Frequency and Duration min 2x/week  2 weeks      SLP Evaluation Cognition  Overall Cognitive Status: Impaired/Different from baseline Arousal/Alertness: Awake/alert Orientation Level: Oriented to person;Oriented to place;Oriented to situation Attention: Sustained Sustained Attention: Appears intact Memory: Impaired Memory  Impairment: Storage deficit;Retrieval deficit Awareness: Impaired Awareness Impairment: Intellectual impairment Problem Solving: Impaired Problem Solving Impairment: Verbal basic Behaviors: Impulsive Safety/Judgment: Impaired       Comprehension  Auditory Comprehension Overall Auditory Comprehension: Appears within functional limits for tasks assessed    Expression Expression Primary Mode of Expression: Verbal Verbal Expression Overall Verbal Expression: Appears within functional limits for tasks assessed   Oral / Motor  Oral Motor/Sensory Function Overall Oral Motor/Sensory Function: Moderate impairment Facial ROM: Reduced left;Suspected CN VII (facial) dysfunction Facial Symmetry: Abnormal symmetry left;Suspected CN VII (facial) dysfunction Facial Strength: Reduced left;Suspected CN VII (facial) dysfunction Facial Sensation: Reduced left;Suspected CN V (Trigeminal) dysfunction Lingual Symmetry: Abnormal symmetry left;Suspected CN XII (hypoglossal) dysfunction Lingual Strength: Reduced Motor Speech Overall Motor Speech: Impaired Phonation: Normal Resonance: Within functional limits Articulation: Impaired Level of Impairment: Conversation Intelligibility: Intelligibility reduced Word: 75-100% accurate Phrase: 75-100% accurate Sentence: 75-100% accurate Conversation: 75-100% accurate Motor Planning: Witnin functional limits   GO                    Blenda Mounts Laurice 10/02/2020, 1:44 PM  Kenzlie Disch L. Samson Frederic, MA CCC/SLP Acute Rehabilitation Services Office number [phone] Pager [phone]

## 2020-10-02 NOTE — Progress Notes (Signed)
Paged and spoke with Dr. Roda Shutters to alert him that the patient seemed to be progressively lethargic.  Dr. Roda Shutters ordered that we take him for his 24 hour post tpa MRI stat.  Will take patient to MRI.

## 2020-10-02 NOTE — Progress Notes (Signed)
Rehab Admissions Coordinator Note:  Patient was screened by Clois Dupes for appropriateness for an Inpatient Acute Rehab Consult per therapy recommendations.  At this time, we are recommending Inpatient Rehab consult. I will place order per protocol.  Clois Dupes RN MSN  10/02/2020, 3:45 PM  I can be reached at [phone].

## 2020-10-03 ENCOUNTER — Inpatient Hospital Stay (HOSPITAL_COMMUNITY): Payer: Medicaid Other

## 2020-10-03 ENCOUNTER — Encounter (HOSPITAL_COMMUNITY): Payer: Self-pay | Admitting: Neurology

## 2020-10-03 DIAGNOSIS — I10 Essential (primary) hypertension: Secondary | ICD-10-CM

## 2020-10-03 DIAGNOSIS — I639 Cerebral infarction, unspecified: Secondary | ICD-10-CM

## 2020-10-03 DIAGNOSIS — E876 Hypokalemia: Secondary | ICD-10-CM

## 2020-10-03 DIAGNOSIS — R011 Cardiac murmur, unspecified: Secondary | ICD-10-CM

## 2020-10-03 DIAGNOSIS — R509 Fever, unspecified: Secondary | ICD-10-CM

## 2020-10-03 DIAGNOSIS — I63511 Cerebral infarction due to unspecified occlusion or stenosis of right middle cerebral artery: Secondary | ICD-10-CM

## 2020-10-03 LAB — BASIC METABOLIC PANEL
Anion gap: 10 (ref 5–15)
BUN: 12 mg/dL (ref 6–20)
CO2: 23 mmol/L (ref 22–32)
Calcium: 7.8 mg/dL — ABNORMAL LOW (ref 8.9–10.3)
Chloride: 106 mmol/L (ref 98–111)
Creatinine, Ser: 1.14 mg/dL (ref 0.61–1.24)
GFR, Estimated: >60 mL/min (ref >60)
Glucose, Bld: 93 mg/dL (ref 70–99)
Potassium: 3.1 mmol/L — ABNORMAL LOW (ref 3.5–5.1)
Sodium: 139 mmol/L (ref 135–145)

## 2020-10-03 LAB — CBC
HCT: 30.2 % — ABNORMAL LOW (ref 39.0–52.0)
Hemoglobin: 10.1 g/dL — ABNORMAL LOW (ref 13.0–17.0)
MCH: 29 pg (ref 26.0–34.0)
MCHC: 33.4 g/dL (ref 30.0–36.0)
MCV: 86.8 fL (ref 80.0–100.0)
Platelets: 259 10*3/uL (ref 150–400)
RBC: 3.48 MIL/uL — ABNORMAL LOW (ref 4.22–5.81)
RDW: 13.2 % (ref 11.5–15.5)
WBC: 8.1 10*3/uL (ref 4.0–10.5)
nRBC: 0 % (ref 0.0–0.2)

## 2020-10-03 MED ORDER — PANTOPRAZOLE SODIUM 40 MG PO PACK
40.0000 mg | PACK | Freq: Every day | ORAL | Status: DC
  Administered 2020-10-03 – 2020-11-01 (×30): 40 mg via ORAL
  Filled 2020-10-03 (×30): qty 20

## 2020-10-03 MED ORDER — POLYETHYLENE GLYCOL 3350 17 G PO PACK
17.0000 g | PACK | Freq: Every day | ORAL | Status: DC | PRN
  Administered 2020-10-03 – 2020-10-07 (×3): 17 g via ORAL
  Filled 2020-10-03 (×2): qty 1

## 2020-10-03 NOTE — Progress Notes (Signed)
Per Dr.Lindzen, "give 5 mg of Labetalol in order set".

## 2020-10-03 NOTE — Progress Notes (Signed)
Inpatient Rehab Admissions Coordinator:   Rehab MD consult has been completed with recommendations for SNF due to lack of 24/7 caregiver support. Pt. Is not expected to meet mod I goals by end of CIR stay and Pt.'s brother states that while he works from home and can assist, he has other responsibilities and cannot provide 24/7 support. Recommend SNF for short term rehab. I will notify TOC.   Megan Salon, MS, CCC-SLP Rehab Admissions Coordinator  [phone] (celll) [phone] (office)

## 2020-10-03 NOTE — Progress Notes (Signed)
Lower extremity venous has been completed.   Preliminary results in CV Proc.   Blanch Media 10/03/2020 10:30 AM

## 2020-10-03 NOTE — Plan of Care (Signed)
  Problem: Education: Goal: Knowledge of disease or condition will improve Outcome: Progressing Goal: Knowledge of secondary prevention will improve Outcome: Progressing Goal: Knowledge of patient specific risk factors addressed and post discharge goals established will improve Outcome: Progressing Goal: Individualized Educational Video(s) Outcome: Progressing   

## 2020-10-03 NOTE — Progress Notes (Addendum)
Physical Therapy Treatment Patient Details Name: Harry [name] MRN: [id number] DOB: 1976/05/27 Today's Date: 10/03/2020    History of Present Illness 45 yo male presenting to ED with left sided weakness. CT showing early ischemic changes around right MCA but no hypodensity. tPA given on 4/18 @1823 . 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 Awaiting MRI. PMH including heart murmur not on medications.    PT Comments    Pt was seen for mobility on side of bed to practice transfers with assist to support LUE.  Pt is quite a bit more stable in appearance as compared to last visit, but did experience some nausea and clear liquid vomiting at the end of time up.  Pt was able to sit down and recover, but leaned over to rest his head on the pillow toward L side.  Encouraged by pt looking toward his L side during standing efforts, as well as with sitting on side of bed at times.  Follow along with him for work on standing stability, recovery of L side strength and return of his gait abilities as he works toward admission to .  Follow for goals of acute PT.   Follow Up Recommendations  CIR     Equipment Recommendations  3in1 (PT);Wheelchair cushion (measurements PT);Wheelchair (measurements PT)    Recommendations for Other Services       Precautions / Restrictions Precautions Precautions: Fall;Other (comment) Precaution Comments: LUE hemiparesis Restrictions Weight Bearing Restrictions: No    Mobility  Bed Mobility Overal bed mobility: Needs Assistance Bed Mobility: Supine to Sit;Sit to Supine     Supine to sit: Min assist;HOB elevated Sit to supine: Mod assist;HOB elevated   General bed mobility comments: primarily assisting LUE and LE'    Transfers Overall transfer level: Needs assistance Equipment used: 1 person hand held assist Transfers: Sit to/from [employer] Sit to Stand: Mod assist;From elevated surface Stand pivot transfers: Mod  assist;From elevated surface       General transfer comment: one person assist was all PT had but could stand and shift with LUE support  Ambulation/Gait             General Gait Details: unable to attempt   Stairs             Wheelchair Mobility    Modified Rankin (Stroke Patients Only) Modified Rankin (Stroke Patients Only) Pre-Morbid Rankin Score: No symptoms Modified Rankin: Moderately severe disability     Balance Overall balance assessment: Needs assistance Sitting-balance support: Feet supported;Single extremity supported Sitting balance-Leahy Scale: Fair Sitting balance - Comments: unable to leave unattended as pt is tending to push on RUE   Standing balance support: Bilateral upper extremity supported;During functional activity Standing balance-Leahy Scale: Poor Standing balance comment: light support on L knee for safety                            Cognition Arousal/Alertness: Lethargic Behavior During Therapy: Flat affect Overall Cognitive Status: Impaired/Different from baseline Area of Impairment: Following commands;Safety/judgement;Awareness;Attention                   Current Attention Level: Sustained Memory: Decreased recall of precautions Following Commands: Follows one step commands inconsistently;Follows one step commands with increased time Safety/Judgement: Decreased awareness of deficits;Decreased awareness of safety Awareness: Emergent;Anticipatory;Intellectual Problem Solving: Decreased initiation;Slow processing;Requires verbal cues;Requires tactile cues General Comments: flat, but attending.  Has trouble controlling trunk with LE movement  Exercises General Exercises - Lower Extremity Heel Slides: AROM;10 reps;Strengthening Hip ABduction/ADduction: AROM;10 reps;Strengthening    General Comments General comments (skin integrity, edema, etc.): has sibling in room who is planning to assist at home, has been  trained as CNA in the past      Pertinent Vitals/Pain Pain Assessment: No/denies pain    Home Living                      Prior Function            PT Goals (current goals can now be found in the care plan section) Acute Rehab PT Goals Patient Stated Goal: go to CIR    Frequency    Min 4X/week      PT Plan Current plan remains appropriate    Co-evaluation              AM-PAC PT "6 Clicks" Mobility   Outcome Measure  Help needed turning from your back to your side while in a flat bed without using bedrails?: A Little Help needed moving from lying on your back to sitting on the side of a flat bed without using bedrails?: A Little Help needed moving to and from a bed to a chair (including a wheelchair)?: A Lot Help needed standing up from a chair using your arms (e.g., wheelchair or bedside chair)?: A Lot Help needed to walk in hospital room?: A Lot Help needed climbing 3-5 steps with a railing? : Total 6 Click Score: 13    End of Session Equipment Utilized During Treatment: Gait belt Activity Tolerance: Patient tolerated treatment well Patient left: in bed;with call bell/phone within reach;with bed alarm set Nurse Communication: Mobility status PT Visit Diagnosis: Unsteadiness on feet (R26.81);Other abnormalities of gait and mobility (R26.89);Difficulty in walking, not elsewhere classified (R26.2);Hemiplegia and hemiparesis Hemiplegia - Right/Left: Left Hemiplegia - dominant/non-dominant: Non-dominant Hemiplegia - caused by: Cerebral infarction     Time: 6759-1638 PT Time Calculation (min) (ACUTE ONLY): 24 min  Charges:  $Therapeutic Exercise: 8-22 mins $Therapeutic Activity: 8-22 mins                 Ivar Drape 10/03/2020, 11:57 AM Samul Dada, PT MS Acute Rehab Dept. Number: [facility] [id number] and [facility] [phone]

## 2020-10-03 NOTE — Progress Notes (Signed)
STROKE TEAM PROGRESS NOTE   SUBJECTIVE (INTERVAL HISTORY) His brother is at the bedside.  No acute event overnight. Patient still lethargic, has right gaze preference, but able to gaze to left without difficulty.  Left arm still flaccid, left lower extremity improved from yesterday.  Still has sensory deficit but not total loss as before and resolved neglect on the left.  Passed swallow on dysphagia 1 and thin liquid.  However, patient did have intermittent choking on his saliva.  He had 1 spiking fever this morning 101 F.    OBJECTIVE Temp:  [98.4 F (36.9 C)-101 F (38.3 C)] 98.4 F (36.9 C) (04/20 2320) Pulse Rate:  [70-92] 77 (04/20 2320) Cardiac Rhythm: Normal sinus rhythm (04/20 1900) Resp:  [12-20] 15 (04/20 2320) BP: (127-175)/(90-109) 159/99 (04/20 2320) SpO2:  [97 %-100 %] 99 % (04/20 2320)  No results for input(s): GLUCAP in the last 168 hours. Recent Labs  Lab 10/01/20 1805 10/01/20 1823 10/03/20 0506  NA 138 137 139  K 3.6 3.6 3.1*  CL 105 105 106  CO2 21*  --  23  GLUCOSE 170* 170* 93  BUN 12 14 12   CREATININE 0.92 0.80 1.14  CALCIUM 8.4*  --  7.8*   Recent Labs  Lab 10/01/20 1805  AST 24  ALT 11  ALKPHOS 44  BILITOT 0.9  PROT 5.0*  ALBUMIN 2.1*   Recent Labs  Lab 10/01/20 1805 10/01/20 1823 10/03/20 0506  WBC 10.9*  --  8.1  NEUTROABS 8.4*  --   --   HGB 10.7* 9.5* 10.1*  HCT 32.6* 28.0* 30.2*  MCV 88.3  --  86.8  PLT 347  --  259   No results for input(s): CKTOTAL, CKMB, CKMBINDEX, TROPONINI in the last 168 hours. Recent Labs    10/01/20 1805  LABPROT 13.8  INR 1.1   No results for input(s): COLORURINE, LABSPEC, PHURINE, GLUCOSEU, HGBUR, BILIRUBINUR, KETONESUR, PROTEINUR, UROBILINOGEN, NITRITE, LEUKOCYTESUR in the last 72 hours.  Invalid input(s): APPERANCEUR     Component Value Date/Time   CHOL 297 (H) 10/02/2020 0607   TRIG 62 10/02/2020 0607   HDL 73 10/02/2020 0607   CHOLHDL 4.1 10/02/2020 0607   VLDL 12 10/02/2020 0607    LDLCALC 212 (H) 10/02/2020 0607   Lab Results  Component Value Date   HGBA1C 5.3 10/02/2020   No results found for: LABOPIA, COCAINSCRNUR, LABBENZ, AMPHETMU, THCU, LABBARB  No results for input(s): ETH in the last 168 hours.  I have personally reviewed the radiological images below and agree with the radiology interpretations.  MR BRAIN WO CONTRAST  Result Date: 10/02/2020 CLINICAL DATA:  Stroke follow-up.  Status post tPA. EXAM: MRI HEAD WITHOUT CONTRAST TECHNIQUE: Multiplanar, multiecho pulse sequences of the brain and surrounding structures were obtained without intravenous contrast. COMPARISON:  October 01, 2020 CT code stroke. FINDINGS: Brain: Acute infarcts in the anterior and posterior right frontal lobe involving cortex and subcortical white matter. Associated edema with sulcal effacement. No substantial midline shift. Mild curvilinear susceptibility artifact the regions of infarct likely represent petechial hemorrhage. No hydrocephalus. No mass lesion. 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 No extra-axial fluid collection. Basal cisterns are patent. Vascular: Focal susceptibility artifact within a right M2 branch in the region of the more posterior frontal lobe infarct, compatible with thrombus and known occlusion better characterized on recent CTA. Major arterial flow voids are maintained skull base. Skull and upper cervical spine: No focal marrow replacing lesion. Sinuses/Orbits: Clear sinuses.  Unremarkable orbits.  Other: Large right mastoid effusion. IMPRESSION: 1. Acute infarcts in the anterior and posterior right frontal lobe. Associated edema with sulcal effacement. Mild curvilinear susceptibility artifact the regions of infarct likely represent petechial hemorrhage. No mass occupying acute hemorrhage. 2. Focal susceptibility artifact within a right M2 MCA branch in the region of the more posterior frontal lobe infarct, compatible with thrombus and known occlusion  better characterized on recent CTA. 3. 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 4. Large right mastoid effusion. Electronically Signed   By: Feliberto HartsFrederick S Jones MD   On: 10/02/2020 16:38   ECHOCARDIOGRAM COMPLETE  Result Date: 10/02/2020    ECHOCARDIOGRAM REPORT   Patient Name:   Harry [name] Date of Exam: 10/02/2020 Medical Rec #:  [id number]         Height:       74.0 in Accession #:    [id number]        Weight:       131.4 lb Date of Birth:  06/12/1976          BSA:          1.818 m Patient Age:    44 years          BP:           168/104 mmHg Patient Gender: M                 HR:           90 bpm. Exam Location:  Inpatient Procedure: 2D Echo Indications:    Stroke I63.9  History:        Patient has no prior history of Echocardiogram examinations.  Sonographer:    Thurman Coyerasey Kirkpatrick RDCS (AE) Referring Phys: [id number] Marvel PlanJINDONG Eoin Willden IMPRESSIONS  1. Left ventricular ejection fraction, by estimation, is 40 to 45%. The left ventricle has mildly decreased function. The left ventricle demonstrates global hypokinesis. There is mild concentric left ventricular hypertrophy. Left ventricular diastolic parameters are consistent with Grade I diastolic dysfunction (impaired relaxation). Elevated left atrial pressure.  2. Right ventricular systolic function is normal. The right ventricular size is normal.  3. The pericardial effusion is circumferential. There is no evidence of cardiac tamponade.  4. The mitral valve is normal in structure. Trivial mitral valve regurgitation. No evidence of mitral stenosis.  5. The aortic valve is normal in structure. Aortic valve regurgitation is trivial. No aortic stenosis is present.  6. Aortic dilatation noted. There is mild dilatation of the aortic root, measuring 44 mm. There is mild dilatation of the ascending aorta, measuring 42 mm.  7. The inferior vena cava is normal in size with greater than 50% respiratory variability, suggesting right atrial pressure of 3 mmHg.  FINDINGS  Left Ventricle: Left ventricular ejection fraction, by estimation, is 40 to 45%. The left ventricle has mildly decreased function. The left ventricle demonstrates global hypokinesis. The left ventricular internal cavity size was normal in size. There is  mild concentric left ventricular hypertrophy. Left ventricular diastolic parameters are consistent with Grade I diastolic dysfunction (impaired relaxation). Elevated left atrial pressure. Right Ventricle: The right ventricular size is normal. No increase in right ventricular wall thickness. Right ventricular systolic function is normal. Left Atrium: Left atrial size was normal in size. Right Atrium: Right atrial size was normal in size. Pericardium: Trivial pericardial effusion is present. The pericardial effusion is circumferential. There is no evidence of cardiac tamponade. Mitral Valve: The mitral valve is normal in structure. Trivial mitral valve regurgitation. No evidence  of mitral valve stenosis. Tricuspid Valve: The tricuspid valve is normal in structure. Tricuspid valve regurgitation is not demonstrated. No evidence of tricuspid stenosis. Aortic Valve: The aortic valve is normal in structure. Aortic valve regurgitation is trivial. No aortic stenosis is present. Aortic valve mean gradient measures 3.0 mmHg. Aortic valve peak gradient measures 5.1 mmHg. Aortic valve area, by VTI measures 3.31 cm. Pulmonic Valve: The pulmonic valve was normal in structure. Pulmonic valve regurgitation is not visualized. No evidence of pulmonic stenosis. Aorta: The aortic root is normal in size and structure and aortic dilatation noted. There is mild dilatation of the aortic root, measuring 44 mm. There is mild dilatation of the ascending aorta, measuring 42 mm. Venous: The inferior vena cava is normal in size with greater than 50% respiratory variability, suggesting right atrial pressure of 3 mmHg. IAS/Shunts: No atrial level shunt detected by color flow Doppler.   LEFT VENTRICLE PLAX 2D LVIDd:         4.60 cm  Diastology LVIDs:         3.40 cm  LV e' medial:   3.81 cm/s LV PW:         1.40 cm  LV E/e' medial: 21.4 LV IVS:        1.40 cm LVOT diam:     2.40 cm LV SV:         64 LV SV Index:   35 LVOT Area:     4.52 cm  RIGHT VENTRICLE TAPSE (M-mode): 2.2 cm LEFT ATRIUM           Index       RIGHT ATRIUM           Index LA diam:      2.00 cm 1.10 cm/m  RA Area:     16.00 cm LA Vol (A2C): 42.7 ml 23.49 ml/m RA Volume:   42.30 ml  23.27 ml/m LA Vol (A4C): 45.1 ml 24.81 ml/m  AORTIC VALVE AV Area (Vmax):    3.97 cm AV Area (Vmean):   3.91 cm AV Area (VTI):     3.31 cm AV Vmax:           113.00 cm/s AV Vmean:          82.900 cm/s AV VTI:            0.193 m AV Peak Grad:      5.1 mmHg AV Mean Grad:      3.0 mmHg LVOT Vmax:         99.20 cm/s LVOT Vmean:        71.700 cm/s LVOT VTI:          0.141 m LVOT/AV VTI ratio: 0.73  AORTA Ao Root diam: 4.40 cm Ao Asc diam:  3.90 cm MITRAL VALVE MV Area (PHT): 4.41 cm    SHUNTS MV Decel Time: 172 msec    Systemic VTI:  0.14 m MV E velocity: 81.50 cm/s  Systemic Diam: 2.40 cm MV A velocity: 85.30 cm/s MV E/A ratio:  0.96 Mihai Croitoru MD Electronically signed by Thurmon Fair MD Signature Date/Time: 10/02/2020/3:08:26 PM    Final    CT HEAD CODE STROKE WO CONTRAST  Result Date: 10/01/2020 CLINICAL DATA:  Code stroke. Acute neuro deficit. Rule out stroke. Left facial droop EXAM: CT HEAD WITHOUT CONTRAST TECHNIQUE: Contiguous axial images were obtained from the base of the skull through the vertex without intravenous contrast. COMPARISON:  None. FINDINGS: Brain: Ventricle size and cerebral volume normal. Well-defined hypodensity  left thalamus compatible with chronic infarction. Negative for acute infarct, hemorrhage, mass Vascular: Negative for hyperdense vessel Skull: Negative Sinuses/Orbits: Mild mucosal edema paranasal sinuses. Large right mastoid effusion and middle ear effusion. Left mastoid sinus clear. Other: None ASPECTS  ([place] Stroke Program Early CT Score) - Ganglionic level infarction (caudate, lentiform nuclei, internal capsule, insula, M1-M3 cortex): 7 - Supraganglionic infarction (M4-M6 cortex): 3 Total score (0-10 with 10 being normal): 10 IMPRESSION: 1. No acute intracranial abnormality 2. ASPECTS is 10 3. Code stroke imaging results were communicated on 10/01/2020 at 6:19 pm to provider Roda Shutters via text page Electronically Signed   By: Marlan Palau M.D.   On: 10/01/2020 18:20   VAS [place] LOWER EXTREMITY VENOUS (DVT)  Result Date: 10/03/2020  Lower Venous DVT Study Indications: Stroke.  Comparison Study: no prior Performing Technologist: Blanch Media RVS  Examination Guidelines: A complete evaluation includes B-mode imaging, spectral Doppler, color Doppler, and power Doppler as needed of all accessible portions of each vessel. Bilateral testing is considered an integral part of a complete examination. Limited examinations for reoccurring indications may be performed as noted. The reflux portion of the exam is performed with the patient in reverse Trendelenburg.  +---------+---------------+---------+-----------+----------+--------------+ RIGHT    CompressibilityPhasicitySpontaneityPropertiesThrombus Aging +---------+---------------+---------+-----------+----------+--------------+ CFV      Full           Yes      Yes                                 +---------+---------------+---------+-----------+----------+--------------+ SFJ      Full                                                        +---------+---------------+---------+-----------+----------+--------------+ FV Prox  Full                                                        +---------+---------------+---------+-----------+----------+--------------+ FV Mid   Full                                                        +---------+---------------+---------+-----------+----------+--------------+ FV DistalFull                                                         +---------+---------------+---------+-----------+----------+--------------+ PFV      Full                                                        +---------+---------------+---------+-----------+----------+--------------+ POP      Full           Yes  Yes                                 +---------+---------------+---------+-----------+----------+--------------+ PTV      Full                                                        +---------+---------------+---------+-----------+----------+--------------+ PERO     Full                                                        +---------+---------------+---------+-----------+----------+--------------+   +---------+---------------+---------+-----------+----------+--------------+ LEFT     CompressibilityPhasicitySpontaneityPropertiesThrombus Aging +---------+---------------+---------+-----------+----------+--------------+ CFV      Full           Yes      Yes                                 +---------+---------------+---------+-----------+----------+--------------+ SFJ      Full                                                        +---------+---------------+---------+-----------+----------+--------------+ FV Prox  Full                                                        +---------+---------------+---------+-----------+----------+--------------+ FV Mid   Full                                                        +---------+---------------+---------+-----------+----------+--------------+ FV DistalFull                                                        +---------+---------------+---------+-----------+----------+--------------+ PFV      Full                                                        +---------+---------------+---------+-----------+----------+--------------+ POP      Full           Yes      Yes                                  +---------+---------------+---------+-----------+----------+--------------+ PTV      Full                                                        +---------+---------------+---------+-----------+----------+--------------+  PERO     Full                                                        +---------+---------------+---------+-----------+----------+--------------+     Summary: BILATERAL: - No evidence of deep vein thrombosis seen in the lower extremities, bilaterally. - No evidence of superficial venous thrombosis in the lower extremities, bilaterally. -No evidence of popliteal cyst, bilaterally.   *See table(s) above for measurements and observations. Electronically signed by Heath Lark on 10/03/2020 at 4:38:55 PM.    Final    CT ANGIO HEAD NECK W WO CM (CODE STROKE)  Addendum Date: 10/01/2020   ADDENDUM REPORT: 10/01/2020 20:03 ADDENDUM: After further discussion with Dr. 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 Electronically Signed   By: Marlan Palau M.D.   On: 10/01/2020 20:03   Result Date: 10/01/2020 CLINICAL DATA:  Left facial droop EXAM: CT HEAD WITHOUT CONTRAST CT ANGIOGRAPHY HEAD AND NECK TECHNIQUE: Multidetector CT imaging of the head and neck was performed using the standard protocol during bolus administration of intravenous contrast. Multiplanar CT image reconstructions and MIPs were obtained to evaluate the vascular anatomy. Carotid stenosis measurements (when applicable) are obtained utilizing NASCET criteria, using the distal internal carotid diameter as the denominator. CONTRAST:  45mL OMNIPAQUE IOHEXOL 350 MG/ML SOLN COMPARISON:  CT head 10/01/2020 FINDINGS: CTA NECK FINDINGS Aortic arch: Standard branching. Imaged portion shows no evidence of aneurysm or dissection. No significant stenosis of the major arch vessel origins. Right carotid system: Normal right carotid. Negative for atherosclerotic disease or stenosis. Left carotid system:  Normal left carotid. Negative for atherosclerotic disease or stenosis. Vertebral arteries: Both vertebral arteries widely patent without stenosis. Skeleton: Cervicothoracic scoliosis. No acute skeletal abnormality. Multiple caries. Periapical lucency around right lower molar. Other neck: No mass or adenopathy. 12 mm left thyroid nodule. No further imaging necessary. Upper chest: Lung apices clear bilaterally. Review of the MIP images confirms the above findings CTA HEAD FINDINGS Anterior circulation: Anterior and middle cerebral arteries normal bilaterally. No stenosis or large vessel occlusion. Posterior circulation: Normal posterior circulation. No stenosis or large vessel occlusion. Venous sinuses: Normal venous enhancement Anatomic variants: None Delayed phase: Not performed Review of the MIP images confirms the above findings IMPRESSION: Normal CT angiogram head and neck. No significant atherosclerotic disease. No stenosis or intracranial large vessel occlusion. Electronically Signed: By: Marlan Palau M.D. On: 10/01/2020 19:16    PHYSICAL EXAM  Temp:  [98.4 F (36.9 C)-101 F (38.3 C)] 98.4 F (36.9 C) (04/20 2320) Pulse Rate:  [70-92] 77 (04/20 2320) Resp:  [12-20] 15 (04/20 2320) BP: (127-175)/(90-109) 159/99 (04/20 2320) SpO2:  [97 %-100 %] 99 % (04/20 2320)  General - Well nourished, well developed, lethargic.  Ophthalmologic - fundi not visualized due to noncooperation.  Cardiovascular - Regular rhythm and rate.  Neuro - lethargic, but eyes open on voice, orientated to age, place, time and people. No aphasia, fluent language, following all simple commands. Able to name and repeat.  Mild dysarthria.  Right gaze preference, however able to have complete left gaze, visual field full no hemianopia, PERRL.  Left facial droop, but improved from yesterday. Tongue protrusion to the left.  Right upper and lower extremity 4/5, left upper extremity flaccid, left lower extremity 3-/5 with drift  and  distal PF/DF 0/5.  Sensation decreased on the left, but no more sensory neglect.  Right finger-to-nose intact.  Gait not tested.    ASSESSMENT/PLAN [name] is a 45 y.o. male with PMH of heart murmur not on medications presented to ER for acute onset left arm and leg weakness, left visual and sensory neglect, right gaze preference, left facial droop nausea/vomiting. tPA given.  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  Discussed with patient regarding risk and benefit of thrombectomy, patient declined thrombectomy.     Stroke:  left MCA infarct, embolic pattern, source unclear   CT head early ischemic changes right MCA but no hypodensity  CTA head neck was done showed right M2/M3 occlusion.   MRI right moderate MCA cortical infarcts   2D Echo EF 40 to 45%  LE venous Doppler no DVT  TEE scheduled on Friday  LDL 212  HgbA1c 5.3  UDS pending  Lovenox for VTE prophylaxis  No antithrombotic prior to admission, now on aspirin 325 mg daily and clopidogrel 75 mg daily DAPT for 3 months and then aspirin alone given large vessel occlusion intracranially.  Patient counseled to be compliant with his antithrombotic medications  Ongoing aggressive stroke risk factor management  Therapy recommendations: CIR  Disposition: Pending  Cardiomyopathy  EF 40 to 45%  TEE pending on Friday  May consider cardiology consult  Hypertensive urgency . Needed labetalol and Cleviprex IV before IV tPA for BP control . Stable on the high end . Off Cleviprex . Permissive hypertension (OK if <180/105) for 24-48 hours post stroke and then gradually normalized within 5-7 days.  Long term BP goal normotensive  Hyperlipidemia  Home meds: None  LDL 212, goal < 70  Now on Lipitor 80  Continue statin at discharge  Dysphagia  Mild dysarthria  On dysphagia 1 and thin liquid  However, patient does have intermittent choking.  Temperature spike  10/03/2020 with 101 F  Chest x-ray pending  Other Stroke Risk Factors  Hx of heart murmur  Other Active Problems  Leukocytosis WBC 10.9-> 8.1  Hypokalemia K 3.1 - monitoring  Hospital day # 2   Marvel Plan, MD PhD Stroke Neurology 10/03/2020 11:42 PM    To contact Stroke Continuity provider, please refer to [email or web address]. After hours, contact General Neurology

## 2020-10-03 NOTE — Progress Notes (Signed)
Pt w/ BP 171/107, Dr. Otelia Limes informed d/t prn coverage order being ambiguous.

## 2020-10-04 ENCOUNTER — Encounter (HOSPITAL_COMMUNITY): Payer: Self-pay | Admitting: Certified Registered Nurse Anesthetist

## 2020-10-04 ENCOUNTER — Inpatient Hospital Stay (HOSPITAL_COMMUNITY): Payer: Medicaid Other

## 2020-10-04 LAB — RAPID URINE DRUG SCREEN, HOSP PERFORMED
Amphetamines: NOT DETECTED
Barbiturates: NOT DETECTED
Benzodiazepines: NOT DETECTED
Cocaine: NOT DETECTED
Opiates: NOT DETECTED
Tetrahydrocannabinol: NOT DETECTED

## 2020-10-04 LAB — CBC
HCT: 28.6 % — ABNORMAL LOW (ref 39.0–52.0)
Hemoglobin: 9.4 g/dL — ABNORMAL LOW (ref 13.0–17.0)
MCH: 29.2 pg (ref 26.0–34.0)
MCHC: 32.9 g/dL (ref 30.0–36.0)
MCV: 88.8 fL (ref 80.0–100.0)
Platelets: 272 10*3/uL (ref 150–400)
RBC: 3.22 MIL/uL — ABNORMAL LOW (ref 4.22–5.81)
RDW: 13.2 % (ref 11.5–15.5)
WBC: 7.4 10*3/uL (ref 4.0–10.5)
nRBC: 0 % (ref 0.0–0.2)

## 2020-10-04 LAB — BASIC METABOLIC PANEL
Anion gap: 8 (ref 5–15)
BUN: 11 mg/dL (ref 6–20)
CO2: 21 mmol/L — ABNORMAL LOW (ref 22–32)
Calcium: 7.4 mg/dL — ABNORMAL LOW (ref 8.9–10.3)
Chloride: 110 mmol/L (ref 98–111)
Creatinine, Ser: 1 mg/dL (ref 0.61–1.24)
GFR, Estimated: >60 mL/min (ref >60)
Glucose, Bld: 92 mg/dL (ref 70–99)
Potassium: 3.1 mmol/L — ABNORMAL LOW (ref 3.5–5.1)
Sodium: 139 mmol/L (ref 135–145)

## 2020-10-04 MED ORDER — AMLODIPINE BESYLATE 10 MG PO TABS
10.0000 mg | ORAL_TABLET | Freq: Every day | ORAL | Status: DC
  Administered 2020-10-04 – 2020-11-02 (×30): 10 mg via ORAL
  Filled 2020-10-04 (×30): qty 1

## 2020-10-04 MED ORDER — POTASSIUM CHLORIDE CRYS ER 20 MEQ PO TBCR
40.0000 meq | EXTENDED_RELEASE_TABLET | ORAL | Status: AC
  Administered 2020-10-04 (×2): 40 meq via ORAL
  Filled 2020-10-04 (×2): qty 2

## 2020-10-04 NOTE — Progress Notes (Signed)
  Speech Language Pathology Treatment: Dysphagia  Patient Details Name: Harry [name] MRN: [id number] DOB: 08-28-1975 Today's Date: 10/04/2020 Time: 2637-8588 SLP Time Calculation (min) (ACUTE ONLY): 20 min  Assessment / Plan / Recommendation Clinical Impression  Pt seen for skilled observation with current diet (Dysphagia 1) and differential dx/upgrade to Dysphagia 2 with min-mod verbal/tactile/visual cues provided for increased attention to left side with pocketing noted, impaired mastication with cracker consistency given and lingual sweep required, along with liquid wash to clear left lateral sulcus.  Pt's alertness level improved this session as pt was lethargic previously during PO intake, but attention and impulsivity affecting PO intake.  Pt educated re: strategies to use during PO intake with 3/4 strategies recalled without cues including slow rate, small bites/sips, liquid wash and left lingual sweep. Recommend upgrade to Dyphagia 2/thin liquids with ST f/u for diet tolerance; discussed swallowing strategies/upgrade to Dysphagia 2 diet with nursing staff and need for continued puree/crushed consistency with medication administration.  Sign posted in room for swallowing precautions/current diet.  Discussed if difficulties arise with upgraded diet, downgrade to Dysphagia 1 prn and/or page ST to see pt.  ST will f/u while in acute setting for diet tolerance/cogntion.     HPI HPI: 45 y.o. RH-male with history of heart murmur who was admitted on 10/01/2020 with acute onset of left hemiparesis with sensory deficits,  right gaze preference and N/V.      SLP Plan  Goals updated       Recommendations  Diet recommendations: Dysphagia 2 (fine chop);Thin liquid Liquids provided via: Straw Medication Administration: Crushed with puree Supervision: Patient able to self feed;Staff to assist with self feeding;Full supervision/cueing for compensatory strategies Compensations: Lingual sweep for  clearance of pocketing;Small sips/bites;Slow rate;Minimize environmental distractions;Monitor for anterior loss;Follow solids with liquid Postural Changes and/or Swallow Maneuvers: Seated upright 90 degrees                General recommendations: Other(comment) (TBD) Oral Care Recommendations: Oral care BID Follow up Recommendations: Other (comment);24 hour supervision/assistance (TBD) SLP Visit Diagnosis: Dysphagia, oropharyngeal phase (R13.12) Plan: Goals updated                       Tressie Stalker, M.S., CCC-SLP 10/04/2020, 11:13 AM

## 2020-10-04 NOTE — Progress Notes (Signed)
Occupational Therapy Treatment Patient Details Name: Harry [name] MRN: [id number] DOB: 08/08/1975 Today's Date: 10/04/2020    History of present illness 45 yo male presenting to ED with left sided weakness. CT showing early ischemic changes around right MCA but no hypodensity. tPA given on 4/18 @1823 . 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 Awaiting MRI. PMH including heart murmur not on medications.   OT comments  Patient continues to make steady progress towards goals in skilled OT session. Patient's session encompassed bed mobility, functional transfers, and NDT techniques. Pt continues to demonstrate no activation in LUE with subluxation noted in shoulder. Pt with increased activation in LLE, however requires max A of 2 to stand and and complete squat pivot transfer. Pt is highly motivated, but requires extra time in order to complete all tasks, and benefits from short, concise commands for increased understanding. Discharge recommendations changed to SNF due to CIR denial; therapy will continue to follow in house.    Follow Up Recommendations  SNF    Equipment Recommendations  Other (comment) (defer to next venue)    Recommendations for Other Services      Precautions / Restrictions Precautions Precautions: Fall;Other (comment) Precaution Comments: LUE hemiparesis Restrictions Weight Bearing Restrictions: No       Mobility Bed Mobility Overal bed mobility: Needs Assistance Bed Mobility: Supine to Sit     Supine to sit: Min assist;Mod assist;+2 for physical assistance     General bed mobility comments: min-modA+2 for bringings LEs off bed, trunk elevation, and repositioning of hips towards EOB. Patient able to hook R LE onto L to assist with bringing off the bed and reach for the bed rail with R hand    Transfers Overall transfer level: Needs assistance Equipment used: 2 person hand held assist Transfers: Sit to/from  Transfers Sit to Stand: Max assist;+2 physical assistance;+2 safety/equipment   Squat pivot transfers: Max assist;+2 safety/equipment     General transfer comment: maxA+2 for sit to stand with HHAx2 and bilateral knee blocked. Cues and facilitation for upright posture and hip extension. MaxA for squat pivot towards L to the chair with patient assist with pushing with R LE    Balance Overall balance assessment: Needs assistance Sitting-balance support: Feet supported;Single extremity supported Sitting balance-Leahy Scale: Poor Sitting balance - Comments: min guard overall except when patient fatigues and requires mod-maxA to maintain sitting EOB due to L lateral lean   Standing balance support: Bilateral upper extremity supported;During functional activity Standing balance-Leahy Scale: Poor Standing balance comment: reliant on external assist and UE support                           ADL either performed or assessed with clinical judgement   ADL Overall ADL's : Needs assistance/impaired Eating/Feeding: Set up;Sitting   Grooming: Moderate assistance;Sitting;Bed level Grooming Details (indicate cue type and reason): Pt able to wipe with mouth with wash cloth while sitting at EOB with Min A; poor lip closure. Mod A for bilateral tasks                 Toilet Transfer: Moderate assistance;+2 for physical assistance;+2 for safety/equipment;Stand-pivot Toilet Transfer Details (indicate cue type and reason): simulated to transfer to recliner         Functional mobility during ADLs: Maximal assistance;Cueing for safety;Cueing for sequencing;+2 for safety/equipment;+2 for physical assistance General ADL Comments: Pt continues to present with decreased funtionality of LUE, cogniton,  balance, strength and activity tolerance. Pt is highly motivated, burt requries increased reorienting to maintain attention to task     Vision       Perception     Praxis      Cognition  Arousal/Alertness: Awake/alert Behavior During Therapy: Flat affect Overall Cognitive Status: Impaired/Different from baseline Area of Impairment: Following commands;Safety/judgement;Awareness;Attention;Problem solving                   Current Attention Level: Sustained Memory: Decreased recall of precautions Following Commands: Follows one step commands inconsistently;Follows one step commands with increased time Safety/Judgement: Decreased awareness of deficits;Decreased awareness of safety Awareness: Emergent Problem Solving: Slow processing;Difficulty sequencing;Requires verbal cues General Comments: highly motivated, but requires increased time in order to engage.        Exercises General Exercises - Lower Extremity Quad Sets: AROM;Left;5 reps;Supine (tactile cues)   Shoulder Instructions       General Comments      Pertinent Vitals/ Pain       Pain Assessment: No/denies pain  Home Living                                          Prior Functioning/Environment              Frequency  Min 2X/week        Progress Toward Goals  OT Goals(current goals can now be found in the care plan section)  Progress towards OT goals: Progressing toward goals  Acute Rehab OT Goals Patient Stated Goal: to get better OT Goal Formulation: With patient/family Time For Goal Achievement: 10/16/20 Potential to Achieve Goals: Good  Plan Discharge plan remains appropriate    Co-evaluation    PT/OT/SLP Co-Evaluation/Treatment: Yes Reason for Co-Treatment: For patient/therapist safety;To address functional/ADL transfers PT goals addressed during session: Mobility/safety with mobility;Balance;Strengthening/ROM OT goals addressed during session: ADL's and self-care;Strengthening/ROM      AM-PAC OT "6 Clicks" Daily Activity     Outcome Measure   Help from another person eating meals?: A Lot Help from another person taking care of personal grooming?:  A Lot Help from another person toileting, which includes using toliet, bedpan, or urinal?: A Lot Help from another person bathing (including washing, rinsing, drying)?: A Lot Help from another person to put on and taking off regular upper body clothing?: A Lot Help from another person to put on and taking off regular lower body clothing?: Total 6 Click Score: 11    End of Session Equipment Utilized During Treatment: Gait belt  OT Visit Diagnosis: Unsteadiness on feet (R26.81);Other abnormalities of gait and mobility (R26.89);Muscle weakness (generalized) (M62.81);Hemiplegia and hemiparesis Hemiplegia - Right/Left: Left Hemiplegia - dominant/non-dominant: Non-Dominant Hemiplegia - caused by: Cerebral infarction   Activity Tolerance Patient limited by fatigue;Patient tolerated treatment well   Patient Left in chair;with call bell/phone within reach;with chair alarm set   Nurse Communication Mobility status;Need for lift equipment        Time: 1275-1700 OT Time Calculation (min): 25 min  Charges: OT General Charges $OT Visit: 1 Visit OT Treatments $Self Care/Home Management : 8-22 mins  Pollyann Glen E. Rifky Lapre, COTA/L Acute Rehabilitation Services [phone]   Cherlyn Cushing 10/04/2020, 3:24 PM

## 2020-10-04 NOTE — Progress Notes (Signed)
   [facility] has been requested to perform a transesophageal echocardiogram on Harry [name] for stroke.  After careful review of history and examination, the risks and benefits of transesophageal echocardiogram have been explained including risks of esophageal damage, perforation (1:10,000 risk), bleeding, pharyngeal hematoma as well as other potential complications associated with conscious sedation including aspiration, arrhythmia, respiratory failure and death. Alternatives to treatment were discussed, questions were answered. Patient is willing to proceed.   Procedure scheduled for 10/05/2020 at 12:30pm with Dr. Elease Hashimoto. Will make NPO at midnight and place orders.  Corrin Parker, PA-C 10/04/2020 3:50 PM

## 2020-10-04 NOTE — Progress Notes (Signed)
Asked patient Harry [name] about signature for Informed Consent form regarding transesophageal echocardiogram (TEE). Patient has reservations and did not want to sign at this time.   When asked the reason for the reservation, patient explained "my family has had issues with surgeries in the past, especially from this hospital. After hearing the risks.Marland KitchenMarland KitchenAsk me in the morning, see how I feel then."  Orders have not been released yet. Patient will remain NPO. Neurology Night MD has been notified.

## 2020-10-04 NOTE — Progress Notes (Signed)
STROKE TEAM PROGRESS NOTE   SUBJECTIVE (INTERVAL HISTORY) No family is at the bedside. Pt more awake alert today and left sided weakness had dramatic improvement. No fever overnight and CXR neg but he still has intermittent cough but improving. SW is working on SNF placement.      OBJECTIVE Temp:  [97.8 F (36.6 C)-99.6 F (37.6 C)] 98.7 F (37.1 C) (04/21 1223) Pulse Rate:  [68-90] 68 (04/21 1223) Cardiac Rhythm: Normal sinus rhythm (04/21 0749) Resp:  [12-18] 16 (04/21 1223) BP: (127-178)/(90-105) 162/98 (04/21 1223) SpO2:  [98 %-100 %] 100 % (04/21 1223)  No results for input(s): GLUCAP in the last 168 hours. Recent Labs  Lab 10/01/20 1805 10/01/20 1823 10/03/20 0506 10/04/20 0248  NA 138 137 139 139  K 3.6 3.6 3.1* 3.1*  CL 105 105 106 110  CO2 21*  --  23 21*  GLUCOSE 170* 170* 93 92  BUN 12 14 12 11   CREATININE 0.92 0.80 1.14 1.00  CALCIUM 8.4*  --  7.8* 7.4*   Recent Labs  Lab 10/01/20 1805  AST 24  ALT 11  ALKPHOS 44  BILITOT 0.9  PROT 5.0*  ALBUMIN 2.1*   Recent Labs  Lab 10/01/20 1805 10/01/20 1823 10/03/20 0506 10/04/20 0248  WBC 10.9*  --  8.1 7.4  NEUTROABS 8.4*  --   --   --   HGB 10.7* 9.5* 10.1* 9.4*  HCT 32.6* 28.0* 30.2* 28.6*  MCV 88.3  --  86.8 88.8  PLT 347  --  259 272   No results for input(s): CKTOTAL, CKMB, CKMBINDEX, TROPONINI in the last 168 hours. Recent Labs    10/01/20 1805  LABPROT 13.8  INR 1.1   No results for input(s): COLORURINE, LABSPEC, PHURINE, GLUCOSEU, HGBUR, BILIRUBINUR, KETONESUR, PROTEINUR, UROBILINOGEN, NITRITE, LEUKOCYTESUR in the last 72 hours.  Invalid input(s): APPERANCEUR     Component Value Date/Time   CHOL 297 (H) 10/02/2020 0607   TRIG 62 10/02/2020 0607   HDL 73 10/02/2020 0607   CHOLHDL 4.1 10/02/2020 0607   VLDL 12 10/02/2020 0607   LDLCALC 212 (H) 10/02/2020 0607   Lab Results  Component Value Date   HGBA1C 5.3 10/02/2020   No results found for: LABOPIA, COCAINSCRNUR, LABBENZ,  AMPHETMU, THCU, LABBARB  No results for input(s): ETH in the last 168 hours.  I have personally reviewed the radiological images below and agree with the radiology interpretations.  MR BRAIN WO CONTRAST  Result Date: 10/02/2020 CLINICAL DATA:  Stroke follow-up.  Status post tPA. EXAM: MRI HEAD WITHOUT CONTRAST TECHNIQUE: Multiplanar, multiecho pulse sequences of the brain and surrounding structures were obtained without intravenous contrast. COMPARISON:  October 01, 2020 CT code stroke. FINDINGS: Brain: Acute infarcts in the anterior and posterior right frontal lobe involving cortex and subcortical white matter. Associated edema with sulcal effacement. No substantial midline shift. Mild curvilinear susceptibility artifact the regions of infarct likely represent petechial hemorrhage. No hydrocephalus. No mass lesion. 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 No extra-axial fluid collection. Basal cisterns are patent. Vascular: Focal susceptibility artifact within a right M2 branch in the region of the more posterior frontal lobe infarct, compatible with thrombus and known occlusion better characterized on recent CTA. Major arterial flow voids are maintained skull base. Skull and upper cervical spine: No focal marrow replacing lesion. Sinuses/Orbits: Clear sinuses.  Unremarkable orbits. Other: Large right mastoid effusion. IMPRESSION: 1. Acute infarcts in the anterior and posterior right frontal lobe. Associated edema with sulcal effacement. Mild  curvilinear susceptibility artifact the regions of infarct likely represent petechial hemorrhage. No mass occupying acute hemorrhage. 2. Focal susceptibility artifact within a right M2 MCA branch in the region of the more posterior frontal lobe infarct, compatible with thrombus and known occlusion better characterized on recent CTA. 3. 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 4. Large right mastoid effusion. Electronically Signed   By:  Feliberto HartsFrederick S Jones MD   On: 10/02/2020 16:38   DG CHEST PORT 1 VIEW  Result Date: 10/04/2020 CLINICAL DATA:  Fever, stroke, left-sided weakness EXAM: PORTABLE CHEST 1 VIEW COMPARISON:  None. FINDINGS: No consolidation, features of edema, pneumothorax, or effusion. Pulmonary vascularity is normally distributed. The cardiomediastinal contours are unremarkable. No acute osseous or soft tissue abnormality. Telemetry leads overlie the chest. IMPRESSION: No acute cardiopulmonary abnormality. Electronically Signed   By: Kreg ShropshirePrice  DeHay M.D.   On: 10/04/2020 06:21   ECHOCARDIOGRAM COMPLETE  Result Date: 10/02/2020    ECHOCARDIOGRAM REPORT   Patient Name:   Harry [name] Date of Exam: 10/02/2020 Medical Rec #:  [id number]         Height:       74.0 in Accession #:    [id number]        Weight:       131.4 lb Date of Birth:  08/18/1975          BSA:          1.818 m Patient Age:    44 years          BP:           168/104 mmHg Patient Gender: M                 HR:           90 bpm. Exam Location:  Inpatient Procedure: 2D Echo Indications:    Stroke I63.9  History:        Patient has no prior history of Echocardiogram examinations.  Sonographer:    Thurman Coyerasey Kirkpatrick RDCS (AE) Referring Phys: [id number] Marvel PlanJINDONG Quintina Hakeem IMPRESSIONS  1. Left ventricular ejection fraction, by estimation, is 40 to 45%. The left ventricle has mildly decreased function. The left ventricle demonstrates global hypokinesis. There is mild concentric left ventricular hypertrophy. Left ventricular diastolic parameters are consistent with Grade I diastolic dysfunction (impaired relaxation). Elevated left atrial pressure.  2. Right ventricular systolic function is normal. The right ventricular size is normal.  3. The pericardial effusion is circumferential. There is no evidence of cardiac tamponade.  4. The mitral valve is normal in structure. Trivial mitral valve regurgitation. No evidence of mitral stenosis.  5. The aortic valve is normal in structure. Aortic  valve regurgitation is trivial. No aortic stenosis is present.  6. Aortic dilatation noted. There is mild dilatation of the aortic root, measuring 44 mm. There is mild dilatation of the ascending aorta, measuring 42 mm.  7. The inferior vena cava is normal in size with greater than 50% respiratory variability, suggesting right atrial pressure of 3 mmHg. FINDINGS  Left Ventricle: Left ventricular ejection fraction, by estimation, is 40 to 45%. The left ventricle has mildly decreased function. The left ventricle demonstrates global hypokinesis. The left ventricular internal cavity size was normal in size. There is  mild concentric left ventricular hypertrophy. Left ventricular diastolic parameters are consistent with Grade I diastolic dysfunction (impaired relaxation). Elevated left atrial pressure. Right Ventricle: The right ventricular size is normal. No increase in right ventricular wall thickness. Right ventricular systolic function  is normal. Left Atrium: Left atrial size was normal in size. Right Atrium: Right atrial size was normal in size. Pericardium: Trivial pericardial effusion is present. The pericardial effusion is circumferential. There is no evidence of cardiac tamponade. Mitral Valve: The mitral valve is normal in structure. Trivial mitral valve regurgitation. No evidence of mitral valve stenosis. Tricuspid Valve: The tricuspid valve is normal in structure. Tricuspid valve regurgitation is not demonstrated. No evidence of tricuspid stenosis. Aortic Valve: The aortic valve is normal in structure. Aortic valve regurgitation is trivial. No aortic stenosis is present. Aortic valve mean gradient measures 3.0 mmHg. Aortic valve peak gradient measures 5.1 mmHg. Aortic valve area, by VTI measures 3.31 cm. Pulmonic Valve: The pulmonic valve was normal in structure. Pulmonic valve regurgitation is not visualized. No evidence of pulmonic stenosis. Aorta: The aortic root is normal in size and structure and aortic  dilatation noted. There is mild dilatation of the aortic root, measuring 44 mm. There is mild dilatation of the ascending aorta, measuring 42 mm. Venous: The inferior vena cava is normal in size with greater than 50% respiratory variability, suggesting right atrial pressure of 3 mmHg. IAS/Shunts: No atrial level shunt detected by color flow Doppler.  LEFT VENTRICLE PLAX 2D LVIDd:         4.60 cm  Diastology LVIDs:         3.40 cm  LV e' medial:   3.81 cm/s LV PW:         1.40 cm  LV E/e' medial: 21.4 LV IVS:        1.40 cm LVOT diam:     2.40 cm LV SV:         64 LV SV Index:   35 LVOT Area:     4.52 cm  RIGHT VENTRICLE TAPSE (M-mode): 2.2 cm LEFT ATRIUM           Index       RIGHT ATRIUM           Index LA diam:      2.00 cm 1.10 cm/m  RA Area:     16.00 cm LA Vol (A2C): 42.7 ml 23.49 ml/m RA Volume:   42.30 ml  23.27 ml/m LA Vol (A4C): 45.1 ml 24.81 ml/m  AORTIC VALVE AV Area (Vmax):    3.97 cm AV Area (Vmean):   3.91 cm AV Area (VTI):     3.31 cm AV Vmax:           113.00 cm/s AV Vmean:          82.900 cm/s AV VTI:            0.193 m AV Peak Grad:      5.1 mmHg AV Mean Grad:      3.0 mmHg LVOT Vmax:         99.20 cm/s LVOT Vmean:        71.700 cm/s LVOT VTI:          0.141 m LVOT/AV VTI ratio: 0.73  AORTA Ao Root diam: 4.40 cm Ao Asc diam:  3.90 cm MITRAL VALVE MV Area (PHT): 4.41 cm    SHUNTS MV Decel Time: 172 msec    Systemic VTI:  0.14 m MV E velocity: 81.50 cm/s  Systemic Diam: 2.40 cm MV A velocity: 85.30 cm/s MV E/A ratio:  0.96 Mihai Croitoru MD Electronically signed by Thurmon Fair MD Signature Date/Time: 10/02/2020/3:08:26 PM    Final    CT HEAD CODE STROKE WO CONTRAST  Result Date: 10/01/2020 CLINICAL DATA:  Code stroke. Acute neuro deficit. Rule out stroke. Left facial droop EXAM: CT HEAD WITHOUT CONTRAST TECHNIQUE: Contiguous axial images were obtained from the base of the skull through the vertex without intravenous contrast. COMPARISON:  None. FINDINGS: Brain: Ventricle size and  cerebral volume normal. Well-defined hypodensity left thalamus compatible with chronic infarction. Negative for acute infarct, hemorrhage, mass Vascular: Negative for hyperdense vessel Skull: Negative Sinuses/Orbits: Mild mucosal edema paranasal sinuses. Large right mastoid effusion and middle ear effusion. Left mastoid sinus clear. Other: None ASPECTS ([place] Stroke Program Early CT Score) - Ganglionic level infarction (caudate, lentiform nuclei, internal capsule, insula, M1-M3 cortex): 7 - Supraganglionic infarction (M4-M6 cortex): 3 Total score (0-10 with 10 being normal): 10 IMPRESSION: 1. No acute intracranial abnormality 2. ASPECTS is 10 3. Code stroke imaging results were communicated on 10/01/2020 at 6:19 pm to provider Roda Shutters via text page Electronically Signed   By: Marlan Palau M.D.   On: 10/01/2020 18:20   VAS [place] LOWER EXTREMITY VENOUS (DVT)  Result Date: 10/03/2020  Lower Venous DVT Study Indications: Stroke.  Comparison Study: no prior Performing Technologist: Blanch Media RVS  Examination Guidelines: A complete evaluation includes B-mode imaging, spectral Doppler, color Doppler, and power Doppler as needed of all accessible portions of each vessel. Bilateral testing is considered an integral part of a complete examination. Limited examinations for reoccurring indications may be performed as noted. The reflux portion of the exam is performed with the patient in reverse Trendelenburg.  +---------+---------------+---------+-----------+----------+--------------+ RIGHT    CompressibilityPhasicitySpontaneityPropertiesThrombus Aging +---------+---------------+---------+-----------+----------+--------------+ CFV      Full           Yes      Yes                                 +---------+---------------+---------+-----------+----------+--------------+ SFJ      Full                                                         +---------+---------------+---------+-----------+----------+--------------+ FV Prox  Full                                                        +---------+---------------+---------+-----------+----------+--------------+ FV Mid   Full                                                        +---------+---------------+---------+-----------+----------+--------------+ FV DistalFull                                                        +---------+---------------+---------+-----------+----------+--------------+ PFV      Full                                                        +---------+---------------+---------+-----------+----------+--------------+  POP      Full           Yes      Yes                                 +---------+---------------+---------+-----------+----------+--------------+ PTV      Full                                                        +---------+---------------+---------+-----------+----------+--------------+ PERO     Full                                                        +---------+---------------+---------+-----------+----------+--------------+   +---------+---------------+---------+-----------+----------+--------------+ LEFT     CompressibilityPhasicitySpontaneityPropertiesThrombus Aging +---------+---------------+---------+-----------+----------+--------------+ CFV      Full           Yes      Yes                                 +---------+---------------+---------+-----------+----------+--------------+ SFJ      Full                                                        +---------+---------------+---------+-----------+----------+--------------+ FV Prox  Full                                                        +---------+---------------+---------+-----------+----------+--------------+ FV Mid   Full                                                         +---------+---------------+---------+-----------+----------+--------------+ FV DistalFull                                                        +---------+---------------+---------+-----------+----------+--------------+ PFV      Full                                                        +---------+---------------+---------+-----------+----------+--------------+ POP      Full           Yes      Yes                                 +---------+---------------+---------+-----------+----------+--------------+  PTV      Full                                                        +---------+---------------+---------+-----------+----------+--------------+ PERO     Full                                                        +---------+---------------+---------+-----------+----------+--------------+     Summary: BILATERAL: - No evidence of deep vein thrombosis seen in the lower extremities, bilaterally. - No evidence of superficial venous thrombosis in the lower extremities, bilaterally. -No evidence of popliteal cyst, bilaterally.   *See table(s) above for measurements and observations. Electronically signed by Heath Lark on 10/03/2020 at 4:38:55 PM.    Final    CT ANGIO HEAD NECK W WO CM (CODE STROKE)  Addendum Date: 10/01/2020   ADDENDUM REPORT: 10/01/2020 20:03 ADDENDUM: After further discussion with Dr. 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 Electronically Signed   By: Marlan Palau M.D.   On: 10/01/2020 20:03   Result Date: 10/01/2020 CLINICAL DATA:  Left facial droop EXAM: CT HEAD WITHOUT CONTRAST CT ANGIOGRAPHY HEAD AND NECK TECHNIQUE: Multidetector CT imaging of the head and neck was performed using the standard protocol during bolus administration of intravenous contrast. Multiplanar CT image reconstructions and MIPs were obtained to evaluate the vascular anatomy. Carotid stenosis measurements (when applicable) are obtained utilizing  NASCET criteria, using the distal internal carotid diameter as the denominator. CONTRAST:  29mL OMNIPAQUE IOHEXOL 350 MG/ML SOLN COMPARISON:  CT head 10/01/2020 FINDINGS: CTA NECK FINDINGS Aortic arch: Standard branching. Imaged portion shows no evidence of aneurysm or dissection. No significant stenosis of the major arch vessel origins. Right carotid system: Normal right carotid. Negative for atherosclerotic disease or stenosis. Left carotid system: Normal left carotid. Negative for atherosclerotic disease or stenosis. Vertebral arteries: Both vertebral arteries widely patent without stenosis. Skeleton: Cervicothoracic scoliosis. No acute skeletal abnormality. Multiple caries. Periapical lucency around right lower molar. Other neck: No mass or adenopathy. 12 mm left thyroid nodule. No further imaging necessary. Upper chest: Lung apices clear bilaterally. Review of the MIP images confirms the above findings CTA HEAD FINDINGS Anterior circulation: Anterior and middle cerebral arteries normal bilaterally. No stenosis or large vessel occlusion. Posterior circulation: Normal posterior circulation. No stenosis or large vessel occlusion. Venous sinuses: Normal venous enhancement Anatomic variants: None Delayed phase: Not performed Review of the MIP images confirms the above findings IMPRESSION: Normal CT angiogram head and neck. No significant atherosclerotic disease. No stenosis or intracranial large vessel occlusion. Electronically Signed: By: Marlan Palau M.D. On: 10/01/2020 19:16    PHYSICAL EXAM  Temp:  [97.8 F (36.6 C)-99.6 F (37.6 C)] 98.7 F (37.1 C) (04/21 1223) Pulse Rate:  [68-90] 68 (04/21 1223) Resp:  [12-18] 16 (04/21 1223) BP: (127-178)/(90-105) 162/98 (04/21 1223) SpO2:  [98 %-100 %] 100 % (04/21 1223)  General - Well nourished, well developed, lethargic.  Ophthalmologic - fundi not visualized due to noncooperation.  Cardiovascular - Regular rhythm and rate.  Neuro - lethargic, but  eyes open on voice, orientated to age, place, time and people. No  aphasia, fluent language, following all simple commands. Able to name and repeat.  Mild dysarthria.  no gaze palsy now, visual field full no hemianopia, PERRL.  Left facial droop. Tongue protrusion to the left.  Right upper and lower extremity 4/5, left upper extremity deltoid 0/5 but bicep 3/5 barely, tricep and finger movement 0/5, left lower extremity 3/5 proximal and distal PF/DF 0/5.  Sensation decreased on the left, still has LLE sensory neglect.  Right finger-to-nose intact.  Gait not tested.    ASSESSMENT/PLAN Harry [name] is a 45 y.o. male with PMH of heart murmur not on medications presented to ER for acute onset left arm and leg weakness, left visual and sensory neglect, right gaze preference, left facial droop nausea/vomiting. tPA given.  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  Discussed with patient regarding risk and benefit of thrombectomy, patient declined thrombectomy.     Stroke:  left MCA infarct, embolic pattern, source unclear   CT head early ischemic changes right MCA but no hypodensity  CTA head neck was done showed right M2/M3 occlusion.   MRI right moderate MCA cortical infarcts   2D Echo EF 40 to 45%  LE venous Doppler no DVT  TEE scheduled on Friday  LDL 212  HgbA1c 5.3  UDS pending  Lovenox for VTE prophylaxis  No antithrombotic prior to admission, now on aspirin 325 mg daily and clopidogrel 75 mg daily DAPT for 3 months and then aspirin alone given large vessel occlusion intracranially.  Patient counseled to be compliant with his antithrombotic medications  Ongoing aggressive stroke risk factor management  Therapy recommendations: CIR -> SNF  Disposition: Pending, SW working on it  Cardiomyopathy  EF 40 to 45%  TEE pending on Friday  May consider cardiology consult  Hypertensive urgency . Needed labetalol and Cleviprex IV before IV tPA for BP  control . Stable on the high end . Off Cleviprex . Add amlodipine 10   Long term BP goal normotensive  Hyperlipidemia  Home meds: None  LDL 212, goal < 70  Now on Lipitor 80  Continue statin at discharge  Dysphagia  Mild dysarthria  On dysphagia 1 and thin liquid  intermittent cough improving.  Temperature spike 10/03/2020 with 101 F -> now afebile  Chest x-ray neg  Other Stroke Risk Factors  Hx of heart murmur  Other Active Problems  Leukocytosis WBC 10.9-> 8.1->7.4  Hypokalemia K 3.1 - supplement  Hospital day # 3   Marvel Plan, MD PhD Stroke Neurology 10/04/2020 1:51 PM    To contact Stroke Continuity provider, please refer to [email or web address]. After hours, contact General Neurology

## 2020-10-04 NOTE — Progress Notes (Signed)
Physical Therapy Treatment Patient Details Name: Harry [name] MRN: [id number] DOB: 17-Jan-1976 Today's Date: 10/04/2020    History of Present Illness 45 yo male presenting to ED with left sided weakness. CT showing early ischemic changes around right MCA but no hypodensity. tPA given on 4/18 @1823 . 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 Awaiting MRI. PMH including heart murmur not on medications.    PT Comments    Patient awake/alert this session but fatigues quickly. Sitting EOB at best required min guard and at most required mod-maxA due to fatigue. Patient able to perform sit to stand with maxA+2, cues and manual facilitation for upright posture and hip extension. Performed squat pivot transfer with maxA towards L with patient assisting by pushing through R LE. Updated d/c recommendation to SNF as CIR denied patient due to lack of family support.      Follow Up Recommendations  SNF     Equipment Recommendations  3in1 (PT);Wheelchair cushion (measurements PT);Wheelchair (measurements PT)    Recommendations for Other Services       Precautions / Restrictions Precautions Precautions: Fall;Other (comment) Precaution Comments: LUE hemiparesis Restrictions Weight Bearing Restrictions: No    Mobility  Bed Mobility Overal bed mobility: Needs Assistance Bed Mobility: Supine to Sit     Supine to sit: Min assist;Mod assist;+2 for physical assistance     General bed mobility comments: min-modA+2 for bringings LEs off bed, trunk elevation, and repositioning of hips towards EOB. Patient able to hook R LE onto L to assist with bringing off the bed and reach for the bed rail with R hand    Transfers Overall transfer level: Needs assistance Equipment used: 2 person hand held assist Transfers: Sit to/from Transfers Sit to Stand: Max assist;+2 physical assistance;+2 safety/equipment   Squat pivot transfers: Max assist;+2 safety/equipment      General transfer comment: maxA+2 for sit to stand with HHAx2 and bilateral knee blocked. Cues and facilitation for upright posture and hip extension. MaxA for squat pivot towards L to the chair with patient assist with pushing with R LE  Ambulation/Gait                 Stairs             Wheelchair Mobility    Modified Rankin (Stroke Patients Only) Modified Rankin (Stroke Patients Only) Pre-Morbid Rankin Score: No symptoms Modified Rankin: Severe disability     Balance Overall balance assessment: Needs assistance Sitting-balance support: Feet supported;Single extremity supported Sitting balance-Leahy Scale: Poor Sitting balance - Comments: min guard overall except when patient fatigues and requires mod-maxA to maintain sitting EOB due to L lateral lean   Standing balance support: Bilateral upper extremity supported;During functional activity Standing balance-Leahy Scale: Poor Standing balance comment: reliant on external assist and UE support                            Cognition Arousal/Alertness: Awake/alert Behavior During Therapy: Flat affect Overall Cognitive Status: Impaired/Different from baseline Area of Impairment: Following commands;Safety/judgement;Awareness;Attention                   Current Attention Level: Sustained Memory: Decreased recall of precautions Following Commands: Follows one step commands inconsistently;Follows one step commands with increased time Safety/Judgement: Decreased awareness of deficits;Decreased awareness of safety Awareness: Emergent Problem Solving: Slow processing;Difficulty sequencing;Requires verbal cues        Exercises General Exercises - Lower Extremity  Quad Sets: AROM;Left;5 reps;Supine (tactile cues)    General Comments        Pertinent Vitals/Pain Pain Assessment: No/denies pain    Home Living                      Prior Function            PT Goals (current goals  can now be found in the care plan section) Acute Rehab PT Goals Patient Stated Goal: to get better PT Goal Formulation: With patient/family Time For Goal Achievement: 10/16/20 Potential to Achieve Goals: Good Progress towards PT goals: Progressing toward goals    Frequency    Min 4X/week      PT Plan Current plan remains appropriate    Co-evaluation PT/OT/SLP Co-Evaluation/Treatment: Yes Reason for Co-Treatment: For patient/therapist safety;To address functional/ADL transfers PT goals addressed during session: Mobility/safety with mobility;Balance;Strengthening/ROM        AM-PAC PT "6 Clicks" Mobility   Outcome Measure  Help needed turning from your back to your side while in a flat bed without using bedrails?: A Little Help needed moving from lying on your back to sitting on the side of a flat bed without using bedrails?: A Little Help needed moving to and from a bed to a chair (including a wheelchair)?: A Lot Help needed standing up from a chair using your arms (e.g., wheelchair or bedside chair)?: A Lot Help needed to walk in hospital room?: A Lot Help needed climbing 3-5 steps with a railing? : Total 6 Click Score: 13    End of Session Equipment Utilized During Treatment: Gait belt Activity Tolerance: Patient tolerated treatment well Patient left: in chair;with call bell/phone within reach;with chair alarm set Nurse Communication: Mobility status PT Visit Diagnosis: Unsteadiness on feet (R26.81);Other abnormalities of gait and mobility (R26.89);Difficulty in walking, not elsewhere classified (R26.2);Hemiplegia and hemiparesis Hemiplegia - Right/Left: Left Hemiplegia - dominant/non-dominant: Non-dominant Hemiplegia - caused by: Cerebral infarction     Time: 0347-4259 PT Time Calculation (min) (ACUTE ONLY): 25 min  Charges:  $Therapeutic Activity: 8-22 mins                     Harry [name] PT, DPT Acute Rehabilitation Services Pager [phone] Office  [phone]    [name] 10/04/2020, 1:50 PM

## 2020-10-05 ENCOUNTER — Encounter (HOSPITAL_COMMUNITY)
Admission: EM | Disposition: A | Discharge status: Skilled Nursing Facility | Payer: Self-pay | Source: Home / Self Care | Attending: Neurology

## 2020-10-05 LAB — CBC
HCT: 28.6 % — ABNORMAL LOW (ref 39.0–52.0)
Hemoglobin: 9.6 g/dL — ABNORMAL LOW (ref 13.0–17.0)
MCH: 29.2 pg (ref 26.0–34.0)
MCHC: 33.6 g/dL (ref 30.0–36.0)
MCV: 86.9 fL (ref 80.0–100.0)
Platelets: 257 10*3/uL (ref 150–400)
RBC: 3.29 MIL/uL — ABNORMAL LOW (ref 4.22–5.81)
RDW: 13 % (ref 11.5–15.5)
WBC: 6.5 10*3/uL (ref 4.0–10.5)
nRBC: 0 % (ref 0.0–0.2)

## 2020-10-05 LAB — BASIC METABOLIC PANEL
Anion gap: 7 (ref 5–15)
BUN: 8 mg/dL (ref 6–20)
CO2: 24 mmol/L (ref 22–32)
Calcium: 7.6 mg/dL — ABNORMAL LOW (ref 8.9–10.3)
Chloride: 110 mmol/L (ref 98–111)
Creatinine, Ser: 0.98 mg/dL (ref 0.61–1.24)
GFR, Estimated: >60 mL/min (ref >60)
Glucose, Bld: 100 mg/dL — ABNORMAL HIGH (ref 70–99)
Potassium: 3.6 mmol/L (ref 3.5–5.1)
Sodium: 141 mmol/L (ref 135–145)

## 2020-10-05 LAB — RAPID URINE DRUG SCREEN, HOSP PERFORMED
Amphetamines: NOT DETECTED
Barbiturates: NOT DETECTED
Benzodiazepines: NOT DETECTED
Cocaine: NOT DETECTED
Opiates: NOT DETECTED
Tetrahydrocannabinol: NOT DETECTED

## 2020-10-05 SURGERY — ECHOCARDIOGRAM, TRANSESOPHAGEAL
Anesthesia: Monitor Anesthesia Care

## 2020-10-05 NOTE — Progress Notes (Signed)
SLP Cancellation Note  Patient Details Name: Harry [name] MRN: [id number] DOB: 1975/12/27   Cancelled treatment:    NPO for procedure. Will continue efforts.  Jeneen Doutt L. Samson Frederic, MA CCC/SLP Acute Rehabilitation Services Office number [phone] Pager [phone]        Carolan Shiver 10/05/2020, 2:42 PM

## 2020-10-05 NOTE — Plan of Care (Signed)

## 2020-10-05 NOTE — Progress Notes (Signed)
Physical Therapy Treatment Patient Details Name: Harry [name] MRN: [id number] DOB: 1976-06-09 Today's Date: 10/05/2020    History of Present Illness 45 yo male presenting to ED with left sided weakness. CT showing early ischemic changes around right MCA but no hypodensity. tPA given on 4/18 @1823 . 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 Awaiting MRI. PMH including heart murmur not on medications.    PT Comments    Patient motivated and eager to participate with therapy. Patient able to perform bed mobility with modA. Patient performed repeated sit to stands in [place] min guard-minA+2 with cues for maintaining midline and assist with L UE. Facilitation at L knee for extension with patient initiating quad contraction. Continue to recommend SNF for ongoing Physical Therapy.        Follow Up Recommendations  SNF     Equipment Recommendations  3in1 (PT);Wheelchair cushion (measurements PT);Wheelchair (measurements PT)    Recommendations for Other Services       Precautions / Restrictions Precautions Precautions: Fall;Other (comment) Precaution Comments: LUE hemiparesis Restrictions Weight Bearing Restrictions: No    Mobility  Bed Mobility Overal bed mobility: Needs Assistance Bed Mobility: Supine to Sit;Sit to Supine     Supine to sit: Mod assist Sit to supine: Mod assist   General bed mobility comments: modA for trunk elevation, LE management, and repositioning hips towards EOB    Transfers Overall transfer level: Needs assistance Equipment used: Ambulation equipment used Transfers: Sit to/from Stand Sit to Stand: Min assist;+2 physical assistance;+2 safety/equipment         General transfer comment: minA+2 for power up into [place]. Sit to stand x 5. Once in [place], min guard-minA from [place] flaps. Assist for support of L UE throughout. Cues for finding midline with good follow through. Facilitation at L knee for extension with patient able to  initiate contraction of quad  Ambulation/Gait                 Stairs             Wheelchair Mobility    Modified Rankin (Stroke Patients Only) Modified Rankin (Stroke Patients Only) Pre-Morbid Rankin Score: No symptoms Modified Rankin: Severe disability     Balance Overall balance assessment: Needs assistance Sitting-balance support: Feet supported;Single extremity supported Sitting balance-Leahy Scale: Fair Sitting balance - Comments: min guard overall once patient able to find midline   Standing balance support: Single extremity supported;During functional activity Standing balance-Leahy Scale: Poor Standing balance comment: reliant on UE support and external assist                            Cognition Arousal/Alertness: Awake/alert Behavior During Therapy: Flat affect Overall Cognitive Status: Impaired/Different from baseline Area of Impairment: Following commands;Safety/judgement;Awareness;Attention;Problem solving                   Current Attention Level: Sustained Memory: Decreased recall of precautions Following Commands: Follows one step commands with increased time;Follows multi-step commands inconsistently Safety/Judgement: Decreased awareness of safety;Decreased awareness of deficits Awareness: Emergent Problem Solving: Slow processing;Difficulty sequencing;Requires verbal cues General Comments: highly motivated but increased time for processing      Exercises      General Comments        Pertinent Vitals/Pain Pain Assessment: No/denies pain    Home Living                      Prior  Function            PT Goals (current goals can now be found in the care plan section) Acute Rehab PT Goals Patient Stated Goal: to get better PT Goal Formulation: With patient/family Time For Goal Achievement: 10/16/20 Potential to Achieve Goals: Good Progress towards PT goals: Progressing toward goals     Frequency    Min 5X/week      PT Plan Current plan remains appropriate    Co-evaluation              AM-PAC PT "6 Clicks" Mobility   Outcome Measure  Help needed turning from your back to your side while in a flat bed without using bedrails?: A Little Help needed moving from lying on your back to sitting on the side of a flat bed without using bedrails?: A Little Help needed moving to and from a bed to a chair (including a wheelchair)?: A Lot Help needed standing up from a chair using your arms (e.g., wheelchair or bedside chair)?: A Lot Help needed to walk in hospital room?: A Lot Help needed climbing 3-5 steps with a railing? : Total 6 Click Score: 13    End of Session Equipment Utilized During Treatment: Gait belt Activity Tolerance: Patient tolerated treatment well Patient left: in bed;with call bell/phone within reach;with bed alarm set Nurse Communication: Mobility status PT Visit Diagnosis: Unsteadiness on feet (R26.81);Other abnormalities of gait and mobility (R26.89);Difficulty in walking, not elsewhere classified (R26.2);Hemiplegia and hemiparesis Hemiplegia - Right/Left: Left Hemiplegia - dominant/non-dominant: Non-dominant Hemiplegia - caused by: Cerebral infarction     Time: 1300-1325 PT Time Calculation (min) (ACUTE ONLY): 25 min  Charges:  $Therapeutic Activity: 23-37 mins                     Lakeisha Waldrop A. Dan Humphreys PT, DPT Acute Rehabilitation Services Pager [phone] Office [phone]    [name] 10/05/2020, 2:54 PM

## 2020-10-05 NOTE — Progress Notes (Signed)
STROKE TEAM PROGRESS NOTE   SUBJECTIVE (INTERVAL HISTORY) No family is at the bedside.  Patient more awake alert, no acute event overnight.  Still has left sided weakness.  Plan for TEE today, however, he was hesitant to signed consent this morning.  I had a long discussion with him and obtained consent later this morning, however his TEE has been rescheduled to Monday.   OBJECTIVE Temp:  [98.5 F (36.9 C)-99.9 F (37.7 C)] 99 F (37.2 C) (04/22 1534) Pulse Rate:  [80-95] 89 (04/22 1534) Cardiac Rhythm: Normal sinus rhythm (04/22 0915) Resp:  [15-20] 18 (04/22 1534) BP: (127-167)/(84-100) 148/91 (04/22 1534) SpO2:  [93 %-100 %] 100 % (04/22 1534)  No results for input(s): GLUCAP in the last 168 hours. Recent Labs  Lab 10/01/20 1805 10/01/20 1823 10/03/20 0506 10/04/20 0248 10/05/20 0441  NA 138 137 139 139 141  K 3.6 3.6 3.1* 3.1* 3.6  CL 105 105 106 110 110  CO2 21*  --  23 21* 24  GLUCOSE 170* 170* 93 92 100*  BUN 12 14 12 11 8   CREATININE 0.92 0.80 1.14 1.00 0.98  CALCIUM 8.4*  --  7.8* 7.4* 7.6*   Recent Labs  Lab 10/01/20 1805  AST 24  ALT 11  ALKPHOS 44  BILITOT 0.9  PROT 5.0*  ALBUMIN 2.1*   Recent Labs  Lab 10/01/20 1805 10/01/20 1823 10/03/20 0506 10/04/20 0248 10/05/20 0441  WBC 10.9*  --  8.1 7.4 6.5  NEUTROABS 8.4*  --   --   --   --   HGB 10.7* 9.5* 10.1* 9.4* 9.6*  HCT 32.6* 28.0* 30.2* 28.6* 28.6*  MCV 88.3  --  86.8 88.8 86.9  PLT 347  --  259 272 257   No results for input(s): CKTOTAL, CKMB, CKMBINDEX, TROPONINI in the last 168 hours. No results for input(s): LABPROT, INR in the last 72 hours. No results for input(s): COLORURINE, LABSPEC, PHURINE, GLUCOSEU, HGBUR, BILIRUBINUR, KETONESUR, PROTEINUR, UROBILINOGEN, NITRITE, LEUKOCYTESUR in the last 72 hours.  Invalid input(s): APPERANCEUR     Component Value Date/Time   CHOL 297 (H) 10/02/2020 0607   TRIG 62 10/02/2020 0607   HDL 73 10/02/2020 0607   CHOLHDL 4.1 10/02/2020 0607   VLDL  12 10/02/2020 0607   LDLCALC 212 (H) 10/02/2020 0607   Lab Results  Component Value Date   HGBA1C 5.3 10/02/2020      Component Value Date/Time   LABOPIA NONE DETECTED 10/05/2020 1321   COCAINSCRNUR NONE DETECTED 10/05/2020 1321   LABBENZ NONE DETECTED 10/05/2020 1321   AMPHETMU NONE DETECTED 10/05/2020 1321   THCU NONE DETECTED 10/05/2020 1321   LABBARB NONE DETECTED 10/05/2020 1321    No results for input(s): ETH in the last 168 hours.  I have personally reviewed the radiological images below and agree with the radiology interpretations.  MR BRAIN WO CONTRAST  Result Date: 10/02/2020 CLINICAL DATA:  Stroke follow-up.  Status post tPA. EXAM: MRI HEAD WITHOUT CONTRAST TECHNIQUE: Multiplanar, multiecho pulse sequences of the brain and surrounding structures were obtained without intravenous contrast. COMPARISON:  October 01, 2020 CT code stroke. FINDINGS: Brain: Acute infarcts in the anterior and posterior right frontal lobe involving cortex and subcortical white matter. Associated edema with sulcal effacement. No substantial midline shift. Mild curvilinear susceptibility artifact the regions of infarct likely represent petechial hemorrhage. No hydrocephalus. No mass lesion. 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 No extra-axial fluid collection. Basal cisterns are patent. Vascular: Focal susceptibility artifact within  a right M2 branch in the region of the more posterior frontal lobe infarct, compatible with thrombus and known occlusion better characterized on recent CTA. Major arterial flow voids are maintained skull base. Skull and upper cervical spine: No focal marrow replacing lesion. Sinuses/Orbits: Clear sinuses.  Unremarkable orbits. Other: Large right mastoid effusion. IMPRESSION: 1. Acute infarcts in the anterior and posterior right frontal lobe. Associated edema with sulcal effacement. Mild curvilinear susceptibility artifact the regions of infarct likely represent  petechial hemorrhage. No mass occupying acute hemorrhage. 2. Focal susceptibility artifact within a right M2 MCA branch in the region of the more posterior frontal lobe infarct, compatible with thrombus and known occlusion better characterized on recent CTA. 3. 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 4. Large right mastoid effusion. Electronically Signed   By: Feliberto Harts MD   On: 10/02/2020 16:38   DG CHEST PORT 1 VIEW  Result Date: 10/04/2020 CLINICAL DATA:  Fever, stroke, left-sided weakness EXAM: PORTABLE CHEST 1 VIEW COMPARISON:  None. FINDINGS: No consolidation, features of edema, pneumothorax, or effusion. Pulmonary vascularity is normally distributed. The cardiomediastinal contours are unremarkable. No acute osseous or soft tissue abnormality. Telemetry leads overlie the chest. IMPRESSION: No acute cardiopulmonary abnormality. Electronically Signed   By: Kreg Shropshire M.D.   On: 10/04/2020 06:21   ECHOCARDIOGRAM COMPLETE  Result Date: 10/02/2020    ECHOCARDIOGRAM REPORT   Patient Name:   Harry [name] Date of Exam: 10/02/2020 Medical Rec #:  [id number]         Height:       74.0 in Accession #:    [id number]        Weight:       131.4 lb Date of Birth:  February 20, 1976          BSA:          1.818 m Patient Age:    45 years          BP:           168/104 mmHg Patient Gender: M                 HR:           90 bpm. Exam Location:  Inpatient Procedure: 2D Echo Indications:    Stroke I63.9  History:        Patient has no prior history of Echocardiogram examinations.  Sonographer:    Thurman Coyer RDCS (AE) Referring Phys: [id number] Marvel Plan IMPRESSIONS  1. Left ventricular ejection fraction, by estimation, is 40 to 45%. The left ventricle has mildly decreased function. The left ventricle demonstrates global hypokinesis. There is mild concentric left ventricular hypertrophy. Left ventricular diastolic parameters are consistent with Grade I diastolic dysfunction (impaired  relaxation). Elevated left atrial pressure.  2. Right ventricular systolic function is normal. The right ventricular size is normal.  3. The pericardial effusion is circumferential. There is no evidence of cardiac tamponade.  4. The mitral valve is normal in structure. Trivial mitral valve regurgitation. No evidence of mitral stenosis.  5. The aortic valve is normal in structure. Aortic valve regurgitation is trivial. No aortic stenosis is present.  6. Aortic dilatation noted. There is mild dilatation of the aortic root, measuring 44 mm. There is mild dilatation of the ascending aorta, measuring 42 mm.  7. The inferior vena cava is normal in size with greater than 50% respiratory variability, suggesting right atrial pressure of 3 mmHg. FINDINGS  Left Ventricle: Left ventricular ejection fraction, by  estimation, is 40 to 45%. The left ventricle has mildly decreased function. The left ventricle demonstrates global hypokinesis. The left ventricular internal cavity size was normal in size. There is  mild concentric left ventricular hypertrophy. Left ventricular diastolic parameters are consistent with Grade I diastolic dysfunction (impaired relaxation). Elevated left atrial pressure. Right Ventricle: The right ventricular size is normal. No increase in right ventricular wall thickness. Right ventricular systolic function is normal. Left Atrium: Left atrial size was normal in size. Right Atrium: Right atrial size was normal in size. Pericardium: Trivial pericardial effusion is present. The pericardial effusion is circumferential. There is no evidence of cardiac tamponade. Mitral Valve: The mitral valve is normal in structure. Trivial mitral valve regurgitation. No evidence of mitral valve stenosis. Tricuspid Valve: The tricuspid valve is normal in structure. Tricuspid valve regurgitation is not demonstrated. No evidence of tricuspid stenosis. Aortic Valve: The aortic valve is normal in structure. Aortic valve  regurgitation is trivial. No aortic stenosis is present. Aortic valve mean gradient measures 3.0 mmHg. Aortic valve peak gradient measures 5.1 mmHg. Aortic valve area, by VTI measures 3.31 cm. Pulmonic Valve: The pulmonic valve was normal in structure. Pulmonic valve regurgitation is not visualized. No evidence of pulmonic stenosis. Aorta: The aortic root is normal in size and structure and aortic dilatation noted. There is mild dilatation of the aortic root, measuring 44 mm. There is mild dilatation of the ascending aorta, measuring 42 mm. Venous: The inferior vena cava is normal in size with greater than 50% respiratory variability, suggesting right atrial pressure of 3 mmHg. IAS/Shunts: No atrial level shunt detected by color flow Doppler.  LEFT VENTRICLE PLAX 2D LVIDd:         4.60 cm  Diastology LVIDs:         3.40 cm  LV e' medial:   3.81 cm/s LV PW:         1.40 cm  LV E/e' medial: 21.4 LV IVS:        1.40 cm LVOT diam:     2.40 cm LV SV:         64 LV SV Index:   35 LVOT Area:     4.52 cm  RIGHT VENTRICLE TAPSE (M-mode): 2.2 cm LEFT ATRIUM           Index       RIGHT ATRIUM           Index LA diam:      2.00 cm 1.10 cm/m  RA Area:     16.00 cm LA Vol (A2C): 42.7 ml 23.49 ml/m RA Volume:   42.30 ml  23.27 ml/m LA Vol (A4C): 45.1 ml 24.81 ml/m  AORTIC VALVE AV Area (Vmax):    3.97 cm AV Area (Vmean):   3.91 cm AV Area (VTI):     3.31 cm AV Vmax:           113.00 cm/s AV Vmean:          82.900 cm/s AV VTI:            0.193 m AV Peak Grad:      5.1 mmHg AV Mean Grad:      3.0 mmHg LVOT Vmax:         99.20 cm/s LVOT Vmean:        71.700 cm/s LVOT VTI:          0.141 m LVOT/AV VTI ratio: 0.73  AORTA Ao Root diam: 4.40 cm Ao Asc diam:  3.90  cm MITRAL VALVE MV Area (PHT): 4.41 cm    SHUNTS MV Decel Time: 172 msec    Systemic VTI:  0.14 m MV E velocity: 81.50 cm/s  Systemic Diam: 2.40 cm MV A velocity: 85.30 cm/s MV E/A ratio:  0.96 Mihai Croitoru MD Electronically signed by Thurmon Fair MD Signature  Date/Time: 10/02/2020/3:08:26 PM    Final    CT HEAD CODE STROKE WO CONTRAST  Result Date: 10/01/2020 CLINICAL DATA:  Code stroke. Acute neuro deficit. Rule out stroke. Left facial droop EXAM: CT HEAD WITHOUT CONTRAST TECHNIQUE: Contiguous axial images were obtained from the base of the skull through the vertex without intravenous contrast. COMPARISON:  None. FINDINGS: Brain: Ventricle size and cerebral volume normal. Well-defined hypodensity left thalamus compatible with chronic infarction. Negative for acute infarct, hemorrhage, mass Vascular: Negative for hyperdense vessel Skull: Negative Sinuses/Orbits: Mild mucosal edema paranasal sinuses. Large right mastoid effusion and middle ear effusion. Left mastoid sinus clear. Other: None ASPECTS ([place] Stroke Program Early CT Score) - Ganglionic level infarction (caudate, lentiform nuclei, internal capsule, insula, M1-M3 cortex): 7 - Supraganglionic infarction (M4-M6 cortex): 3 Total score (0-10 with 10 being normal): 10 IMPRESSION: 1. No acute intracranial abnormality 2. ASPECTS is 10 3. Code stroke imaging results were communicated on 10/01/2020 at 6:19 pm to provider Roda Shutters via text page Electronically Signed   By: Marlan Palau M.D.   On: 10/01/2020 18:20   VAS [place] LOWER EXTREMITY VENOUS (DVT)  Result Date: 10/03/2020  Lower Venous DVT Study Indications: Stroke.  Comparison Study: no prior Performing Technologist: Blanch Media RVS  Examination Guidelines: A complete evaluation includes B-mode imaging, spectral Doppler, color Doppler, and power Doppler as needed of all accessible portions of each vessel. Bilateral testing is considered an integral part of a complete examination. Limited examinations for reoccurring indications may be performed as noted. The reflux portion of the exam is performed with the patient in reverse Trendelenburg.  +---------+---------------+---------+-----------+----------+--------------+ RIGHT     CompressibilityPhasicitySpontaneityPropertiesThrombus Aging +---------+---------------+---------+-----------+----------+--------------+ CFV      Full           Yes      Yes                                 +---------+---------------+---------+-----------+----------+--------------+ SFJ      Full                                                        +---------+---------------+---------+-----------+----------+--------------+ FV Prox  Full                                                        +---------+---------------+---------+-----------+----------+--------------+ FV Mid   Full                                                        +---------+---------------+---------+-----------+----------+--------------+ FV DistalFull                                                        +---------+---------------+---------+-----------+----------+--------------+  PFV      Full                                                        +---------+---------------+---------+-----------+----------+--------------+ POP      Full           Yes      Yes                                 +---------+---------------+---------+-----------+----------+--------------+ PTV      Full                                                        +---------+---------------+---------+-----------+----------+--------------+ PERO     Full                                                        +---------+---------------+---------+-----------+----------+--------------+   +---------+---------------+---------+-----------+----------+--------------+ LEFT     CompressibilityPhasicitySpontaneityPropertiesThrombus Aging +---------+---------------+---------+-----------+----------+--------------+ CFV      Full           Yes      Yes                                 +---------+---------------+---------+-----------+----------+--------------+ SFJ      Full                                                         +---------+---------------+---------+-----------+----------+--------------+ FV Prox  Full                                                        +---------+---------------+---------+-----------+----------+--------------+ FV Mid   Full                                                        +---------+---------------+---------+-----------+----------+--------------+ FV DistalFull                                                        +---------+---------------+---------+-----------+----------+--------------+ PFV      Full                                                        +---------+---------------+---------+-----------+----------+--------------+  POP      Full           Yes      Yes                                 +---------+---------------+---------+-----------+----------+--------------+ PTV      Full                                                        +---------+---------------+---------+-----------+----------+--------------+ PERO     Full                                                        +---------+---------------+---------+-----------+----------+--------------+     Summary: BILATERAL: - No evidence of deep vein thrombosis seen in the lower extremities, bilaterally. - No evidence of superficial venous thrombosis in the lower extremities, bilaterally. -No evidence of popliteal cyst, bilaterally.   *See table(s) above for measurements and observations. Electronically signed by Heath Larkhomas Hawken on 10/03/2020 at 4:38:55 PM.    Final    CT ANGIO HEAD NECK W WO CM (CODE STROKE)  Addendum Date: 10/01/2020   ADDENDUM REPORT: 10/01/2020 20:03 ADDENDUM: After further discussion with Dr. Roda ShuttersXu, there is a right M2 branch occlusion supplying the right parietal lobe. Electronically Signed   By: Marlan Palauharles  Clark M.D.   On: 10/01/2020 20:03   Result Date: 10/01/2020 CLINICAL DATA:  Left facial droop EXAM: CT HEAD WITHOUT CONTRAST CT ANGIOGRAPHY HEAD AND  NECK TECHNIQUE: Multidetector CT imaging of the head and neck was performed using the standard protocol during bolus administration of intravenous contrast. Multiplanar CT image reconstructions and MIPs were obtained to evaluate the vascular anatomy. Carotid stenosis measurements (when applicable) are obtained utilizing NASCET criteria, using the distal internal carotid diameter as the denominator. CONTRAST:  75mL OMNIPAQUE IOHEXOL 350 MG/ML SOLN COMPARISON:  CT head 10/01/2020 FINDINGS: CTA NECK FINDINGS Aortic arch: Standard branching. Imaged portion shows no evidence of aneurysm or dissection. No significant stenosis of the major arch vessel origins. Right carotid system: Normal right carotid. Negative for atherosclerotic disease or stenosis. Left carotid system: Normal left carotid. Negative for atherosclerotic disease or stenosis. Vertebral arteries: Both vertebral arteries widely patent without stenosis. Skeleton: Cervicothoracic scoliosis. No acute skeletal abnormality. Multiple caries. Periapical lucency around right lower molar. Other neck: No mass or adenopathy. 12 mm left thyroid nodule. No further imaging necessary. Upper chest: Lung apices clear bilaterally. Review of the MIP images confirms the above findings CTA HEAD FINDINGS Anterior circulation: Anterior and middle cerebral arteries normal bilaterally. No stenosis or large vessel occlusion. Posterior circulation: Normal posterior circulation. No stenosis or large vessel occlusion. Venous sinuses: Normal venous enhancement Anatomic variants: None Delayed phase: Not performed Review of the MIP images confirms the above findings IMPRESSION: Normal CT angiogram head and neck. No significant atherosclerotic disease. No stenosis or intracranial large vessel occlusion. Electronically Signed: By: Marlan Palauharles  Clark M.D. On: 10/01/2020 19:16    PHYSICAL EXAM  Temp:  [98.5 F (36.9 C)-99.9 F (37.7 C)] 99 F (37.2 C) (04/22 1534) Pulse Rate:  [80-95] 89  (04/22 1534) Resp:  [15-20] 18 (  04/22 1534) BP: (127-167)/(84-100) 148/91 (04/22 1534) SpO2:  [93 %-100 %] 100 % (04/22 1534)  General - Well nourished, well developed, lethargic.  Ophthalmologic - fundi not visualized due to noncooperation.  Cardiovascular - Regular rhythm and rate.  Neuro - lethargic, but eyes open on voice, orientated to age, place, time and people. No aphasia, fluent language, following all simple commands. Able to name and repeat.  Mild dysarthria.  no gaze palsy now, visual field full no hemianopia, PERRL.  Left facial droop. Tongue protrusion to the left.  Right upper and lower extremity 4/5, left upper extremity deltoid 0/5 but bicep 3/5 barely, tricep and finger movement 0/5, left lower extremity 3/5 proximal and distal PF/DF 0/5.  Sensation decreased on the left, still has LLE sensory neglect.  Right finger-to-nose intact.  Gait not tested.    ASSESSMENT/PLAN Harry [name] is a 45 y.o. male with PMH of heart murmur not on medications presented to ER for acute onset left arm and leg weakness, left visual and sensory neglect, right gaze preference, left facial droop nausea/vomiting. tPA given.  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  Discussed with patient regarding risk and benefit of thrombectomy, patient declined thrombectomy.     Stroke:  left MCA infarct, embolic pattern, source unclear   CT head early ischemic changes right MCA but no hypodensity  CTA head neck was done showed right M2/M3 occlusion.   MRI right moderate MCA cortical infarcts   2D Echo EF 40 to 45%  LE venous Doppler no DVT  TEE rescheduled to Monday  LDL 212  HgbA1c 5.3  UDS negative  Lovenox for VTE prophylaxis  No antithrombotic prior to admission, now on aspirin 325 mg daily and clopidogrel 75 mg daily DAPT for 3 months and then aspirin alone given large vessel occlusion intracranially.  Patient counseled to be compliant with his  antithrombotic medications  Ongoing aggressive stroke risk factor management  Therapy recommendations: CIR -> SNF  Disposition: Pending, SW working on it  Cardiomyopathy  EF 40 to 45%  TEE rescheduled to Monday  May consider cardiology consult based on TEE result  Hypertensive urgency . Needed labetalol and Cleviprex IV before IV tPA for BP control . Stable on the high end . Off Cleviprex . Add amlodipine 10   Long term BP goal normotensive  Hyperlipidemia  Home meds: None  LDL 212, goal < 70  Now on Lipitor 80  Continue statin at discharge  Dysphagia  Mild dysarthria  On dysphagia 1 and thin liquid  intermittent cough improving.  Temperature spike 10/03/2020 with 101 F -> now afebile  Chest x-ray neg  Other Stroke Risk Factors  Hx of heart murmur  Other Active Problems  Leukocytosis WBC 10.9-> 8.1->7.4->6.5  Hypokalemia K 3.1 ->3.6  Hospital day # 4   Marvel Plan, MD PhD Stroke Neurology 10/05/2020 8:07 PM    To contact Stroke Continuity provider, please refer to [email or web address]. After hours, contact General Neurology

## 2020-10-05 NOTE — TOC Initial Note (Signed)
Transition of Care [facility]) - Initial/Assessment Note    Patient Details  Name: Harry [name] MRN: [id number] Date of Birth: July 11, 1975  [facility] CM/SW Contact:    Geralynn Ochs, LCSW Phone Number: 10/05/2020, 10:39 AM  Clinical Narrative:    CSW met with patient and brother at bedside to discuss barriers to discharge, as patient does not have any insurance coverage. CSW explained financial counseling will reach out to check eligibility for Medicaid and Disability, and then we can seek LOG placement after those are both pending, but there are no local SNFs that are accepting LOG at this time. CSW checked with PT and OT to see if patient would be appropriate for pilot program to work with him more intensely inside the hospital to get him back home while working towards SNF, and they thought he'd be a good candidate. CSW also discussed with CSW leadership to get patient evaluated for that program. CSW explained program to patient and his brother at bedside, and they indicated understanding. CSW explained that both options will take time, so the patient will remain in house. CSW to follow.               Expected Discharge Plan: Skilled Nursing Facility Barriers to Discharge: Continued Medical Work up,Inadequate or no insurance   Patient Goals and CMS Choice Patient states their goals for this hospitalization and ongoing recovery are:: to get better CMS Medicare.gov Compare Post Acute Care list provided to:: Patient Choice offered to / list presented to : [place]  Expected Discharge Plan and Services Expected Discharge Plan: [place] Choice: [place] Living arrangements for the past 2 months: Apartment                                      Prior Living Arrangements/Services Living arrangements for the past 2 months: Apartment Lives with:: Self Patient language and need for interpreter reviewed::  No Do you feel safe going back to the place where you live?: Yes      Need for Family Participation in Patient Care: No (Comment) Care giver support system in place?: No (comment)   Criminal Activity/Legal Involvement Pertinent to Current Situation/Hospitalization: No - Comment as needed  Activities of Daily Living      Permission Sought/Granted Permission sought to share information with : Facility Retail banker granted to share information with : Yes, Verbal Permission Granted  Share Information with NAME: Neila Gear  Permission granted to share info w AGENCY: SNF  Permission granted to share info w Relationship: Brother     Emotional Assessment Appearance:: Appears stated age Attitude/Demeanor/Rapport: Engaged Affect (typically observed): Appropriate Orientation: : Oriented to Self,Oriented to Place,Oriented to  Time,Oriented to Situation Alcohol / Substance Use: Not Applicable Psych Involvement: No (comment)  Admission diagnosis:  Stroke (cerebrum) (HCC) [I63.9] Cerebral infarction, unspecified mechanism ([place]) [I63.9] Patient Active Problem List   Diagnosis Date Noted  . Benign essential HTN   . FUO (fever of unknown origin)   . Heart murmur   . Hypokalemia   . Stroke (cerebrum) ([place]) 10/01/2020   PCP:  Pcp, No Pharmacy:   [facility], [address] [place] [address] Phone: [phone] Fax: [phone]     Social Determinants of Health (SDOH) Interventions    Readmission Risk Interventions  No flowsheet data found.

## 2020-10-05 NOTE — Progress Notes (Signed)
I asked patient for the second time concerning signature for Informed Consent form regarding transesophageal echocardiogram (TEE). Patient still has reservations and did not sign the consent form. Patient stated "I don't really want to sign it when I don't feel 100% about it."   TEE Orders have still not been released. Will pass along to day shift RN.

## 2020-10-06 ENCOUNTER — Inpatient Hospital Stay (HOSPITAL_COMMUNITY): Payer: Medicaid Other

## 2020-10-06 LAB — CBC
HCT: 30.1 % — ABNORMAL LOW (ref 39.0–52.0)
Hemoglobin: 10.1 g/dL — ABNORMAL LOW (ref 13.0–17.0)
MCH: 29.3 pg (ref 26.0–34.0)
MCHC: 33.6 g/dL (ref 30.0–36.0)
MCV: 87.2 fL (ref 80.0–100.0)
Platelets: 275 10*3/uL (ref 150–400)
RBC: 3.45 MIL/uL — ABNORMAL LOW (ref 4.22–5.81)
RDW: 13.1 % (ref 11.5–15.5)
WBC: 10.4 10*3/uL (ref 4.0–10.5)
nRBC: 0 % (ref 0.0–0.2)

## 2020-10-06 LAB — VITAMIN B12: Vitamin B-12: 279 pg/mL (ref 180–914)

## 2020-10-06 LAB — BASIC METABOLIC PANEL
Anion gap: 11 (ref 5–15)
BUN: 11 mg/dL (ref 6–20)
CO2: 20 mmol/L — ABNORMAL LOW (ref 22–32)
Calcium: 7.8 mg/dL — ABNORMAL LOW (ref 8.9–10.3)
Chloride: 107 mmol/L (ref 98–111)
Creatinine, Ser: 0.9 mg/dL (ref 0.61–1.24)
GFR, Estimated: >60 mL/min (ref >60)
Glucose, Bld: 113 mg/dL — ABNORMAL HIGH (ref 70–99)
Potassium: 3.5 mmol/L (ref 3.5–5.1)
Sodium: 138 mmol/L (ref 135–145)

## 2020-10-06 LAB — TSH: TSH: 1.055 u[IU]/mL (ref 0.350–4.500)

## 2020-10-06 NOTE — Progress Notes (Signed)
Physical Therapy Treatment Patient Details Name: Harry [name] MRN: [id number] DOB: [birth year] Today's Date: 10/06/2020    History of Present Illness 45 yo male presenting to ED with left sided weakness. CT showing early ischemic changes around right MCA but no hypodensity. tPA given on 4/18 @1823 . 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 Awaiting MRI. PMH including heart murmur not on medications.    PT Comments    Session limited by sudden nausea/vomiting sitting EOB. Patient was able to perform bed mobility with modA. Patient performed seated AAROM exercises for L LE in sitting. Noted movement in L UE with patient able to initiate elbow flexion and shoulder adduction. Continue to recommend SNF for ongoing Physical Therapy.       Follow Up Recommendations  SNF     Equipment Recommendations  3in1 (PT);Wheelchair cushion (measurements PT);Wheelchair (measurements PT)    Recommendations for Other Services       Precautions / Restrictions Precautions Precautions: Fall;Other (comment) Precaution Comments: LUE hemiparesis Restrictions Weight Bearing Restrictions: No    Mobility  Bed Mobility Overal bed mobility: Needs Assistance Bed Mobility: Supine to Sit;Sit to Supine     Supine to sit: Mod assist Sit to supine: Mod assist   General bed mobility comments: modA for trunk elevation and LE management. Encouraged patient to use R LE to assist L LE off bed    Transfers                 General transfer comment: deferred due to patient vomiting and reporting nausea  Ambulation/Gait                 Stairs             Wheelchair Mobility    Modified Rankin (Stroke Patients Only) Modified Rankin (Stroke Patients Only) Pre-Morbid Rankin Score: No symptoms Modified Rankin: Severe disability     Balance Overall balance assessment: Needs assistance Sitting-balance support: Feet supported;Single extremity supported Sitting  balance-Leahy Scale: Fair Sitting balance - Comments: min guard for safety                                    Cognition Arousal/Alertness: Awake/alert Behavior During Therapy: Flat affect Overall Cognitive Status: Impaired/Different from baseline Area of Impairment: Following commands;Safety/judgement;Awareness;Attention;Problem solving                   Current Attention Level: Sustained   Following Commands: Follows one step commands with increased time;Follows multi-step commands inconsistently Safety/Judgement: Decreased awareness of safety Awareness: Emergent Problem Solving: Slow processing;Difficulty sequencing;Requires verbal cues General Comments: highly motivated but increased time for processing      Exercises General Exercises - Lower Extremity Long Arc Quad: AAROM;Left;5 reps;Seated Heel Slides: AAROM;Left;5 reps Hip ABduction/ADduction: AAROM;Left;5 reps    General Comments        Pertinent Vitals/Pain Pain Assessment: No/denies pain    Home Living                      Prior Function            PT Goals (current goals can now be found in the care plan section) Acute Rehab PT Goals Patient Stated Goal: to get better PT Goal Formulation: With patient/family Time For Goal Achievement: 10/16/20 Potential to Achieve Goals: Good Progress towards PT goals: Progressing toward goals    Frequency  Min 5X/week      PT Plan Current plan remains appropriate    Co-evaluation              AM-PAC PT "6 Clicks" Mobility   Outcome Measure  Help needed turning from your back to your side while in a flat bed without using bedrails?: A Little Help needed moving from lying on your back to sitting on the side of a flat bed without using bedrails?: A Little Help needed moving to and from a bed to a chair (including a wheelchair)?: A Lot Help needed standing up from a chair using your arms (e.g., wheelchair or bedside  chair)?: A Lot Help needed to walk in hospital room?: A Lot Help needed climbing 3-5 steps with a railing? : Total 6 Click Score: 13    End of Session Equipment Utilized During Treatment: Gait belt Activity Tolerance: Patient limited by fatigue;Other (comment) (limited by nausea/vomiting) Patient left: in bed;with call bell/phone within reach;with bed alarm set Nurse Communication: Mobility status;Other (comment) (nausea/vomiting) PT Visit Diagnosis: Unsteadiness on feet (R26.81);Other abnormalities of gait and mobility (R26.89);Difficulty in walking, not elsewhere classified (R26.2);Hemiplegia and hemiparesis Hemiplegia - Right/Left: Left Hemiplegia - dominant/non-dominant: Non-dominant Hemiplegia - caused by: Cerebral infarction     Time: 4431-5400 PT Time Calculation (min) (ACUTE ONLY): 19 min  Charges:  $Therapeutic Activity: 8-22 mins                     [name] PT, DPT Acute Rehabilitation Services Pager [phone] Office [phone]    [name] 10/06/2020, 1:22 PM

## 2020-10-06 NOTE — Progress Notes (Signed)
STROKE TEAM PROGRESS NOTE   SUBJECTIVE (INTERVAL HISTORY) No family at the bedside.  Febrile overnight to 38.5.  WBC increased from 6.5->10.4.  CXR unremarkable.  UA pending.  Patient more awake, alert, and oriented. Still has left sided weakness.  Plan for TEE on Monday.  OBJECTIVE Temp:  [98.4 F (36.9 C)-101.3 F (38.5 C)] 100.3 F (37.9 C) (04/23 1150) Pulse Rate:  [83-97] 97 (04/23 1150) Cardiac Rhythm: Normal sinus rhythm (04/23 0743) Resp:  [18-20] 18 (04/23 1150) BP: (121-166)/(75-99) 121/75 (04/23 1150) SpO2:  [97 %-100 %] 97 % (04/23 1150)  No results for input(s): GLUCAP in the last 168 hours. Recent Labs  Lab 10/01/20 1805 10/01/20 1823 10/03/20 0506 10/04/20 0248 10/05/20 0441 10/06/20 0324  NA 138 137 139 139 141 138  K 3.6 3.6 3.1* 3.1* 3.6 3.5  CL 105 105 106 110 110 107  CO2 21*  --  23 21* 24 20*  GLUCOSE 170* 170* 93 92 100* 113*  BUN 12 14 12 11 8 11   CREATININE 0.92 0.80 1.14 1.00 0.98 0.90  CALCIUM 8.4*  --  7.8* 7.4* 7.6* 7.8*   Recent Labs  Lab 10/01/20 1805  AST 24  ALT 11  ALKPHOS 44  BILITOT 0.9  PROT 5.0*  ALBUMIN 2.1*   Recent Labs  Lab 10/01/20 1805 10/01/20 1823 10/03/20 0506 10/04/20 0248 10/05/20 0441 10/06/20 0324  WBC 10.9*  --  8.1 7.4 6.5 10.4  NEUTROABS 8.4*  --   --   --   --   --   HGB 10.7* 9.5* 10.1* 9.4* 9.6* 10.1*  HCT 32.6* 28.0* 30.2* 28.6* 28.6* 30.1*  MCV 88.3  --  86.8 88.8 86.9 87.2  PLT 347  --  259 272 257 275   No results for input(s): CKTOTAL, CKMB, CKMBINDEX, TROPONINI in the last 168 hours. No results for input(s): LABPROT, INR in the last 72 hours. No results for input(s): COLORURINE, LABSPEC, PHURINE, GLUCOSEU, HGBUR, BILIRUBINUR, KETONESUR, PROTEINUR, UROBILINOGEN, NITRITE, LEUKOCYTESUR in the last 72 hours.  Invalid input(s): APPERANCEUR     Component Value Date/Time   CHOL 297 (H) 10/02/2020 0607   TRIG 62 10/02/2020 0607   HDL 73 10/02/2020 0607   CHOLHDL 4.1 10/02/2020 0607   VLDL 12  10/02/2020 0607   LDLCALC 212 (H) 10/02/2020 0607   Lab Results  Component Value Date   HGBA1C 5.3 10/02/2020      Component Value Date/Time   LABOPIA NONE DETECTED 10/05/2020 1321   COCAINSCRNUR NONE DETECTED 10/05/2020 1321   LABBENZ NONE DETECTED 10/05/2020 1321   AMPHETMU NONE DETECTED 10/05/2020 1321   THCU NONE DETECTED 10/05/2020 1321   LABBARB NONE DETECTED 10/05/2020 1321    No results for input(s): ETH in the last 168 hours.  I have personally reviewed the radiological images below and agree with the radiology interpretations.  MR BRAIN WO CONTRAST  Result Date: 10/02/2020 CLINICAL DATA:  Stroke follow-up.  Status post tPA. EXAM: MRI HEAD WITHOUT CONTRAST TECHNIQUE: Multiplanar, multiecho pulse sequences of the brain and surrounding structures were obtained without intravenous contrast. COMPARISON:  October 01, 2020 CT code stroke. FINDINGS: Brain: Acute infarcts in the anterior and posterior right frontal lobe involving cortex and subcortical white matter. Associated edema with sulcal effacement. No substantial midline shift. Mild curvilinear susceptibility artifact the regions of infarct likely represent petechial hemorrhage. No hydrocephalus. No mass lesion. 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 No extra-axial fluid collection. Basal cisterns are patent. Vascular: Focal susceptibility artifact  within a right M2 branch in the region of the more posterior frontal lobe infarct, compatible with thrombus and known occlusion better characterized on recent CTA. Major arterial flow voids are maintained skull base. Skull and upper cervical spine: No focal marrow replacing lesion. Sinuses/Orbits: Clear sinuses.  Unremarkable orbits. Other: Large right mastoid effusion. IMPRESSION: 1. Acute infarcts in the anterior and posterior right frontal lobe. Associated edema with sulcal effacement. Mild curvilinear susceptibility artifact the regions of infarct likely represent  petechial hemorrhage. No mass occupying acute hemorrhage. 2. Focal susceptibility artifact within a right M2 MCA branch in the region of the more posterior frontal lobe infarct, compatible with thrombus and known occlusion better characterized on recent CTA. 3. 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 4. Large right mastoid effusion. Electronically Signed   By: Feliberto HartsFrederick S Jones MD   On: 10/02/2020 16:38   DG CHEST PORT 1 VIEW  Result Date: 10/06/2020 CLINICAL DATA:  Fevers. EXAM: PORTABLE CHEST 1 VIEW COMPARISON:  10/04/2020 FINDINGS: Stable cardiac enlargement. No pleural effusion or edema. No airspace densities identified. Visualized osseous structures are notable for thoracic scoliosis. IMPRESSION: 1. No acute cardiopulmonary abnormalities. 2. Cardiac enlargement. Electronically Signed   By: Signa Kellaylor  Stroud M.D.   On: 10/06/2020 07:53   DG CHEST PORT 1 VIEW  Result Date: 10/04/2020 CLINICAL DATA:  Fever, stroke, left-sided weakness EXAM: PORTABLE CHEST 1 VIEW COMPARISON:  None. FINDINGS: No consolidation, features of edema, pneumothorax, or effusion. Pulmonary vascularity is normally distributed. The cardiomediastinal contours are unremarkable. No acute osseous or soft tissue abnormality. Telemetry leads overlie the chest. IMPRESSION: No acute cardiopulmonary abnormality. Electronically Signed   By: Kreg ShropshirePrice  DeHay M.D.   On: 10/04/2020 06:21   ECHOCARDIOGRAM COMPLETE  Result Date: 10/02/2020    ECHOCARDIOGRAM REPORT   Patient Name:   Harry [name] Date of Exam: 10/02/2020 Medical Rec #:  [id number]         Height:       74.0 in Accession #:    [id number]        Weight:       131.4 lb Date of Birth:  08/24/1975          BSA:          1.818 m Patient Age:    44 years          BP:           168/104 mmHg Patient Gender: M                 HR:           90 bpm. Exam Location:  Inpatient Procedure: 2D Echo Indications:    Stroke I63.9  History:        Patient has no prior history of  Echocardiogram examinations.  Sonographer:    Thurman Coyerasey Kirkpatrick RDCS (AE) Referring Phys: [id number] Marvel PlanJINDONG XU IMPRESSIONS  1. Left ventricular ejection fraction, by estimation, is 40 to 45%. The left ventricle has mildly decreased function. The left ventricle demonstrates global hypokinesis. There is mild concentric left ventricular hypertrophy. Left ventricular diastolic parameters are consistent with Grade I diastolic dysfunction (impaired relaxation). Elevated left atrial pressure.  2. Right ventricular systolic function is normal. The right ventricular size is normal.  3. The pericardial effusion is circumferential. There is no evidence of cardiac tamponade.  4. The mitral valve is normal in structure. Trivial mitral valve regurgitation. No evidence of mitral stenosis.  5. The aortic valve is normal in structure. Aortic  valve regurgitation is trivial. No aortic stenosis is present.  6. Aortic dilatation noted. There is mild dilatation of the aortic root, measuring 44 mm. There is mild dilatation of the ascending aorta, measuring 42 mm.  7. The inferior vena cava is normal in size with greater than 50% respiratory variability, suggesting right atrial pressure of 3 mmHg. FINDINGS  Left Ventricle: Left ventricular ejection fraction, by estimation, is 40 to 45%. The left ventricle has mildly decreased function. The left ventricle demonstrates global hypokinesis. The left ventricular internal cavity size was normal in size. There is  mild concentric left ventricular hypertrophy. Left ventricular diastolic parameters are consistent with Grade I diastolic dysfunction (impaired relaxation). Elevated left atrial pressure. Right Ventricle: The right ventricular size is normal. No increase in right ventricular wall thickness. Right ventricular systolic function is normal. Left Atrium: Left atrial size was normal in size. Right Atrium: Right atrial size was normal in size. Pericardium: Trivial pericardial effusion is  present. The pericardial effusion is circumferential. There is no evidence of cardiac tamponade. Mitral Valve: The mitral valve is normal in structure. Trivial mitral valve regurgitation. No evidence of mitral valve stenosis. Tricuspid Valve: The tricuspid valve is normal in structure. Tricuspid valve regurgitation is not demonstrated. No evidence of tricuspid stenosis. Aortic Valve: The aortic valve is normal in structure. Aortic valve regurgitation is trivial. No aortic stenosis is present. Aortic valve mean gradient measures 3.0 mmHg. Aortic valve peak gradient measures 5.1 mmHg. Aortic valve area, by VTI measures 3.31 cm. Pulmonic Valve: The pulmonic valve was normal in structure. Pulmonic valve regurgitation is not visualized. No evidence of pulmonic stenosis. Aorta: The aortic root is normal in size and structure and aortic dilatation noted. There is mild dilatation of the aortic root, measuring 44 mm. There is mild dilatation of the ascending aorta, measuring 42 mm. Venous: The inferior vena cava is normal in size with greater than 50% respiratory variability, suggesting right atrial pressure of 3 mmHg. IAS/Shunts: No atrial level shunt detected by color flow Doppler.  LEFT VENTRICLE PLAX 2D LVIDd:         4.60 cm  Diastology LVIDs:         3.40 cm  LV e' medial:   3.81 cm/s LV PW:         1.40 cm  LV E/e' medial: 21.4 LV IVS:        1.40 cm LVOT diam:     2.40 cm LV SV:         64 LV SV Index:   35 LVOT Area:     4.52 cm  RIGHT VENTRICLE TAPSE (M-mode): 2.2 cm LEFT ATRIUM           Index       RIGHT ATRIUM           Index LA diam:      2.00 cm 1.10 cm/m  RA Area:     16.00 cm LA Vol (A2C): 42.7 ml 23.49 ml/m RA Volume:   42.30 ml  23.27 ml/m LA Vol (A4C): 45.1 ml 24.81 ml/m  AORTIC VALVE AV Area (Vmax):    3.97 cm AV Area (Vmean):   3.91 cm AV Area (VTI):     3.31 cm AV Vmax:           113.00 cm/s AV Vmean:          82.900 cm/s AV VTI:            0.193 m AV Peak Grad:  5.1 mmHg AV Mean Grad:       3.0 mmHg LVOT Vmax:         99.20 cm/s LVOT Vmean:        71.700 cm/s LVOT VTI:          0.141 m LVOT/AV VTI ratio: 0.73  AORTA Ao Root diam: 4.40 cm Ao Asc diam:  3.90 cm MITRAL VALVE MV Area (PHT): 4.41 cm    SHUNTS MV Decel Time: 172 msec    Systemic VTI:  0.14 m MV E velocity: 81.50 cm/s  Systemic Diam: 2.40 cm MV A velocity: 85.30 cm/s MV E/A ratio:  0.96 Mihai Croitoru MD Electronically signed by Thurmon Fair MD Signature Date/Time: 10/02/2020/3:08:26 PM    Final    CT HEAD CODE STROKE WO CONTRAST  Result Date: 10/01/2020 CLINICAL DATA:  Code stroke. Acute neuro deficit. Rule out stroke. Left facial droop EXAM: CT HEAD WITHOUT CONTRAST TECHNIQUE: Contiguous axial images were obtained from the base of the skull through the vertex without intravenous contrast. COMPARISON:  None. FINDINGS: Brain: Ventricle size and cerebral volume normal. Well-defined hypodensity left thalamus compatible with chronic infarction. Negative for acute infarct, hemorrhage, mass Vascular: Negative for hyperdense vessel Skull: Negative Sinuses/Orbits: Mild mucosal edema paranasal sinuses. Large right mastoid effusion and middle ear effusion. Left mastoid sinus clear. Other: None ASPECTS ([place] Stroke Program Early CT Score) - Ganglionic level infarction (caudate, lentiform nuclei, internal capsule, insula, M1-M3 cortex): 7 - Supraganglionic infarction (M4-M6 cortex): 3 Total score (0-10 with 10 being normal): 10 IMPRESSION: 1. No acute intracranial abnormality 2. ASPECTS is 10 3. Code stroke imaging results were communicated on 10/01/2020 at 6:19 pm to provider Roda Shutters via text page Electronically Signed   By: Marlan Palau M.D.   On: 10/01/2020 18:20   VAS [place] LOWER EXTREMITY VENOUS (DVT)  Result Date: 10/03/2020  Lower Venous DVT Study Indications: Stroke.  Comparison Study: no prior Performing Technologist: Blanch Media RVS  Examination Guidelines: A complete evaluation includes B-mode imaging, spectral Doppler, color  Doppler, and power Doppler as needed of all accessible portions of each vessel. Bilateral testing is considered an integral part of a complete examination. Limited examinations for reoccurring indications may be performed as noted. The reflux portion of the exam is performed with the patient in reverse Trendelenburg.  +---------+---------------+---------+-----------+----------+--------------+ RIGHT    CompressibilityPhasicitySpontaneityPropertiesThrombus Aging +---------+---------------+---------+-----------+----------+--------------+ CFV      Full           Yes      Yes                                 +---------+---------------+---------+-----------+----------+--------------+ SFJ      Full                                                        +---------+---------------+---------+-----------+----------+--------------+ FV Prox  Full                                                        +---------+---------------+---------+-----------+----------+--------------+ FV Mid   Full                                                        +---------+---------------+---------+-----------+----------+--------------+  FV DistalFull                                                        +---------+---------------+---------+-----------+----------+--------------+ PFV      Full                                                        +---------+---------------+---------+-----------+----------+--------------+ POP      Full           Yes      Yes                                 +---------+---------------+---------+-----------+----------+--------------+ PTV      Full                                                        +---------+---------------+---------+-----------+----------+--------------+ PERO     Full                                                        +---------+---------------+---------+-----------+----------+--------------+    +---------+---------------+---------+-----------+----------+--------------+ LEFT     CompressibilityPhasicitySpontaneityPropertiesThrombus Aging +---------+---------------+---------+-----------+----------+--------------+ CFV      Full           Yes      Yes                                 +---------+---------------+---------+-----------+----------+--------------+ SFJ      Full                                                        +---------+---------------+---------+-----------+----------+--------------+ FV Prox  Full                                                        +---------+---------------+---------+-----------+----------+--------------+ FV Mid   Full                                                        +---------+---------------+---------+-----------+----------+--------------+ FV DistalFull                                                        +---------+---------------+---------+-----------+----------+--------------+  PFV      Full                                                        +---------+---------------+---------+-----------+----------+--------------+ POP      Full           Yes      Yes                                 +---------+---------------+---------+-----------+----------+--------------+ PTV      Full                                                        +---------+---------------+---------+-----------+----------+--------------+ PERO     Full                                                        +---------+---------------+---------+-----------+----------+--------------+     Summary: BILATERAL: - No evidence of deep vein thrombosis seen in the lower extremities, bilaterally. - No evidence of superficial venous thrombosis in the lower extremities, bilaterally. -No evidence of popliteal cyst, bilaterally.   *See table(s) above for measurements and observations. Electronically signed by Heath Lark on 10/03/2020 at  4:38:55 PM.    Final    CT ANGIO HEAD NECK W WO CM (CODE STROKE)  Addendum Date: 10/01/2020   ADDENDUM REPORT: 10/01/2020 20:03 ADDENDUM: After further discussion with Dr. 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 Electronically Signed   By: Marlan Palau M.D.   On: 10/01/2020 20:03   Result Date: 10/01/2020 CLINICAL DATA:  Left facial droop EXAM: CT HEAD WITHOUT CONTRAST CT ANGIOGRAPHY HEAD AND NECK TECHNIQUE: Multidetector CT imaging of the head and neck was performed using the standard protocol during bolus administration of intravenous contrast. Multiplanar CT image reconstructions and MIPs were obtained to evaluate the vascular anatomy. Carotid stenosis measurements (when applicable) are obtained utilizing NASCET criteria, using the distal internal carotid diameter as the denominator. CONTRAST:  90mL OMNIPAQUE IOHEXOL 350 MG/ML SOLN COMPARISON:  CT head 10/01/2020 FINDINGS: CTA NECK FINDINGS Aortic arch: Standard branching. Imaged portion shows no evidence of aneurysm or dissection. No significant stenosis of the major arch vessel origins. Right carotid system: Normal right carotid. Negative for atherosclerotic disease or stenosis. Left carotid system: Normal left carotid. Negative for atherosclerotic disease or stenosis. Vertebral arteries: Both vertebral arteries widely patent without stenosis. Skeleton: Cervicothoracic scoliosis. No acute skeletal abnormality. Multiple caries. Periapical lucency around right lower molar. Other neck: No mass or adenopathy. 12 mm left thyroid nodule. No further imaging necessary. Upper chest: Lung apices clear bilaterally. Review of the MIP images confirms the above findings CTA HEAD FINDINGS Anterior circulation: Anterior and middle cerebral arteries normal bilaterally. No stenosis or large vessel occlusion. Posterior circulation: Normal posterior circulation. No stenosis or large vessel occlusion. Venous sinuses: Normal venous  enhancement Anatomic variants: None Delayed phase: Not performed Review of the MIP images confirms  the above findings IMPRESSION: Normal CT angiogram head and neck. No significant atherosclerotic disease. No stenosis or intracranial large vessel occlusion. Electronically Signed: By: Marlan Palau M.D. On: 10/01/2020 19:16    PHYSICAL EXAM  Temp:  [98.4 F (36.9 C)-101.3 F (38.5 C)] 100.3 F (37.9 C) (04/23 1150) Pulse Rate:  [83-97] 97 (04/23 1150) Resp:  [18-20] 18 (04/23 1150) BP: (121-166)/(75-99) 121/75 (04/23 1150) SpO2:  [97 %-100 %] 97 % (04/23 1150)  General - Well nourished, well developed  Ophthalmologic - fundi not visualized due to noncooperation.  Cardiovascular - Regular rhythm and rate.  Neuro - awake alert and oriented  to age, place, time and people. No aphasia, fluent language, following all simple commands. Able to name and repeat.  Mild dysarthria.  no gaze palsy now, visual field full no hemianopia, PERRL.  Left facial droop. Tongue protrusion to the left.  Right upper and lower extremity 4/5, left upper extremity deltoid 0/5 but bicep 2/5 barely, tricep and finger movement 0/5, left lower extremity 3/5 proximal and distal PF/DF 0/5.  Sensation decreased on the left, still has LLE sensory neglect.  Right finger-to-nose intact.  Gait not tested.    ASSESSMENT/PLAN [name] is a 45 y.o. male with PMH of heart murmur not on medications presented to ER for acute onset left arm and leg weakness, left visual and sensory neglect, right gaze preference, left facial droop nausea/vomiting. tPA given.  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  Discussed with patient regarding risk and benefit of thrombectomy, patient declined thrombectomy.     Stroke:  left MCA infarct, embolic pattern, source unclear   CT head early ischemic changes right MCA but no hypodensity  CTA head neck was done showed right M2/M3 occlusion.   MRI right moderate  MCA cortical infarcts   2D Echo EF 40 to 45%  LE venous Doppler no DVT  TEE rescheduled to Monday  LDL 212  HgbA1c 5.3  UDS negative  Lovenox for VTE prophylaxis  No antithrombotic prior to admission, now on aspirin 325 mg daily and clopidogrel 75 mg daily DAPT for 3 months and then aspirin alone given large vessel occlusion intracranially.  Patient counseled to be compliant with his antithrombotic medications  Ongoing aggressive stroke risk factor management  Therapy recommendations: CIR -> SNF  Disposition: Pending, SW working on it  Cardiomyopathy  EF 40 to 45%  TEE rescheduled to Monday  May consider cardiology consult based on TEE result  Hypertensive urgency . Needed labetalol and Cleviprex IV before IV tPA for BP control . Stable on the high end . Off Cleviprex . On amlodipine 10   Long term BP goal normotensive  Hyperlipidemia  Home meds: None  LDL 212, goal < 70  Now on Lipitor 80  Continue statin at discharge  Dysphagia  Mild dysarthria  On dysphagia 2 and thin liquid  intermittent cough improving  Other Stroke Risk Factors  Hx of heart murmur  Other Active Problems  Leukocytosis WBC 10.9-> 8.1->7.4->6.5->10.4  Febrile 38.5 overnight. CXR no infiltrates. UA pending  Hypokalemia K 3.1 ->3.6->3.5  Hospital day # 5     To contact Stroke Continuity provider, please refer to [email or web address]. After hours, contact General Neurology

## 2020-10-07 LAB — CBC
HCT: 28.7 % — ABNORMAL LOW (ref 39.0–52.0)
Hemoglobin: 9.5 g/dL — ABNORMAL LOW (ref 13.0–17.0)
MCH: 28.9 pg (ref 26.0–34.0)
MCHC: 33.1 g/dL (ref 30.0–36.0)
MCV: 87.2 fL (ref 80.0–100.0)
Platelets: 244 10*3/uL (ref 150–400)
RBC: 3.29 MIL/uL — ABNORMAL LOW (ref 4.22–5.81)
RDW: 13.1 % (ref 11.5–15.5)
WBC: 13.8 10*3/uL — ABNORMAL HIGH (ref 4.0–10.5)
nRBC: 0 % (ref 0.0–0.2)

## 2020-10-07 LAB — URINALYSIS, ROUTINE W REFLEX MICROSCOPIC
Bilirubin Urine: NEGATIVE
Glucose, UA: 50 mg/dL — AB
Ketones, ur: NEGATIVE mg/dL
Nitrite: NEGATIVE
Protein, ur: >=300 mg/dL — AB
Specific Gravity, Urine: 1.026 (ref 1.005–1.030)
pH: 5 (ref 5.0–8.0)

## 2020-10-07 LAB — BASIC METABOLIC PANEL
Anion gap: 9 (ref 5–15)
BUN: 11 mg/dL (ref 6–20)
CO2: 22 mmol/L (ref 22–32)
Calcium: 7.5 mg/dL — ABNORMAL LOW (ref 8.9–10.3)
Chloride: 105 mmol/L (ref 98–111)
Creatinine, Ser: 1.01 mg/dL (ref 0.61–1.24)
GFR, Estimated: >60 mL/min (ref >60)
Glucose, Bld: 99 mg/dL (ref 70–99)
Potassium: 3.1 mmol/L — ABNORMAL LOW (ref 3.5–5.1)
Sodium: 136 mmol/L (ref 135–145)

## 2020-10-07 LAB — RPR: RPR Ser Ql: NONREACTIVE

## 2020-10-07 MED ORDER — CYANOCOBALAMIN 1000 MCG/ML IJ SOLN
1000.0000 ug | Freq: Once | INTRAMUSCULAR | Status: AC
  Administered 2020-10-07: 1000 ug via INTRAMUSCULAR
  Filled 2020-10-07: qty 1

## 2020-10-07 NOTE — Progress Notes (Signed)
Pt need to be encourage to improve on his po intake.

## 2020-10-07 NOTE — Progress Notes (Signed)
STROKE TEAM PROGRESS NOTE   SUBJECTIVE (INTERVAL HISTORY) No nausea reported.  Fever is defervesced.  There is some evidence of bacteria.  Urine culture will be obtained.  OBJECTIVE Temp:  [98.5 F (36.9 C)-99.9 F (37.7 C)] 98.5 F (36.9 C) (04/24 1119) Pulse Rate:  [67-94] 67 (04/24 1119) Cardiac Rhythm: Normal sinus rhythm (04/24 0826) Resp:  [16-20] 18 (04/24 1119) BP: (124-146)/(75-92) 136/79 (04/24 1119) SpO2:  [99 %-100 %] 100 % (04/24 1119)  No results for input(s): GLUCAP in the last 168 hours. Recent Labs  Lab 10/03/20 0506 10/04/20 0248 10/05/20 0441 10/06/20 0324 10/07/20 0229  NA 139 139 141 138 136  K 3.1* 3.1* 3.6 3.5 3.1*  CL 106 110 110 107 105  CO2 23 21* 24 20* 22  GLUCOSE 93 92 100* 113* 99  BUN 12 11 8 11 11   CREATININE 1.14 1.00 0.98 0.90 1.01  CALCIUM 7.8* 7.4* 7.6* 7.8* 7.5*   Recent Labs  Lab 10/01/20 1805  AST 24  ALT 11  ALKPHOS 44  BILITOT 0.9  PROT 5.0*  ALBUMIN 2.1*   Recent Labs  Lab 10/01/20 1805 10/01/20 1823 10/03/20 0506 10/04/20 0248 10/05/20 0441 10/06/20 0324 10/07/20 0229  WBC 10.9*  --  8.1 7.4 6.5 10.4 13.8*  NEUTROABS 8.4*  --   --   --   --   --   --   HGB 10.7*   < > 10.1* 9.4* 9.6* 10.1* 9.5*  HCT 32.6*   < > 30.2* 28.6* 28.6* 30.1* 28.7*  MCV 88.3  --  86.8 88.8 86.9 87.2 87.2  PLT 347  --  259 272 257 275 244   < > = values in this interval not displayed.   No results for input(s): CKTOTAL, CKMB, CKMBINDEX, TROPONINI in the last 168 hours. No results for input(s): LABPROT, INR in the last 72 hours. Recent Labs    10/06/20 1441  COLORURINE YELLOW  LABSPEC 1.026  PHURINE 5.0  GLUCOSEU 50*  HGBUR MODERATE*  BILIRUBINUR NEGATIVE  KETONESUR NEGATIVE  PROTEINUR >=300*  NITRITE NEGATIVE  LEUKOCYTESUR TRACE*       Component Value Date/Time   CHOL 297 (H) 10/02/2020 0607   TRIG 62 10/02/2020 0607   HDL 73 10/02/2020 0607   CHOLHDL 4.1 10/02/2020 0607   VLDL 12 10/02/2020 0607   LDLCALC 212 (H)  10/02/2020 0607   Lab Results  Component Value Date   HGBA1C 5.3 10/02/2020      Component Value Date/Time   LABOPIA NONE DETECTED 10/05/2020 1321   COCAINSCRNUR NONE DETECTED 10/05/2020 1321   LABBENZ NONE DETECTED 10/05/2020 1321   AMPHETMU NONE DETECTED 10/05/2020 1321   THCU NONE DETECTED 10/05/2020 1321   LABBARB NONE DETECTED 10/05/2020 1321    No results for input(s): ETH in the last 168 hours.  I have personally reviewed the radiological images below and agree with the radiology interpretations.  MR BRAIN WO CONTRAST  Result Date: 10/02/2020 CLINICAL DATA:  Stroke follow-up.  Status post tPA. EXAM: MRI HEAD WITHOUT CONTRAST TECHNIQUE: Multiplanar, multiecho pulse sequences of the brain and surrounding structures were obtained without intravenous contrast. COMPARISON:  October 01, 2020 CT code stroke. FINDINGS: Brain: Acute infarcts in the anterior and posterior right frontal lobe involving cortex and subcortical white matter. Associated edema with sulcal effacement. No substantial midline shift. Mild curvilinear susceptibility artifact the regions of infarct likely represent petechial hemorrhage. No hydrocephalus. No mass lesion. 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 No extra-axial fluid  collection. Basal cisterns are patent. Vascular: Focal susceptibility artifact within a right M2 branch in the region of the more posterior frontal lobe infarct, compatible with thrombus and known occlusion better characterized on recent CTA. Major arterial flow voids are maintained skull base. Skull and upper cervical spine: No focal marrow replacing lesion. Sinuses/Orbits: Clear sinuses.  Unremarkable orbits. Other: Large right mastoid effusion. IMPRESSION: 1. Acute infarcts in the anterior and posterior right frontal lobe. Associated edema with sulcal effacement. Mild curvilinear susceptibility artifact the regions of infarct likely represent petechial hemorrhage. No mass occupying  acute hemorrhage. 2. Focal susceptibility artifact within a right M2 MCA branch in the region of the more posterior frontal lobe infarct, compatible with thrombus and known occlusion better characterized on recent CTA. 3. 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 4. Large right mastoid effusion. Electronically Signed   By: Feliberto Harts MD   On: 10/02/2020 16:38   DG CHEST PORT 1 VIEW  Result Date: 10/06/2020 CLINICAL DATA:  Fevers. EXAM: PORTABLE CHEST 1 VIEW COMPARISON:  10/04/2020 FINDINGS: Stable cardiac enlargement. No pleural effusion or edema. No airspace densities identified. Visualized osseous structures are notable for thoracic scoliosis. IMPRESSION: 1. No acute cardiopulmonary abnormalities. 2. Cardiac enlargement. Electronically Signed   By: Signa Kell M.D.   On: 10/06/2020 07:53   DG CHEST PORT 1 VIEW  Result Date: 10/04/2020 CLINICAL DATA:  Fever, stroke, left-sided weakness EXAM: PORTABLE CHEST 1 VIEW COMPARISON:  None. FINDINGS: No consolidation, features of edema, pneumothorax, or effusion. Pulmonary vascularity is normally distributed. The cardiomediastinal contours are unremarkable. No acute osseous or soft tissue abnormality. Telemetry leads overlie the chest. IMPRESSION: No acute cardiopulmonary abnormality. Electronically Signed   By: Kreg Shropshire M.D.   On: 10/04/2020 06:21   DG Abd Portable 1V  Result Date: 10/06/2020 CLINICAL DATA:  Ileus. EXAM: PORTABLE ABDOMEN - 1 VIEW COMPARISON:  None. FINDINGS: Air-filled loops of large and small bowel. The colon appears to be normal in caliber. Small bowel loops are mildly dilated. No free air, portal venous gas, or pneumatosis. There is a stool ball in the rectum. No other abnormalities. IMPRESSION: 1. Air-filled loops of large and small bowel. The small bowel appears to be mildly dilated, out of proportion of the colon. While ileus is possible, findings are concerning for early small bowel obstruction.  Electronically Signed   By: Gerome Sam III M.D   On: 10/06/2020 17:33   ECHOCARDIOGRAM COMPLETE  Result Date: 10/02/2020    ECHOCARDIOGRAM REPORT   Patient Name:   Harry [name] Date of Exam: 10/02/2020 Medical Rec #:  [id number]         Height:       74.0 in Accession #:    [id number]        Weight:       131.4 lb Date of Birth:  March 13, 1976          BSA:          1.818 m Patient Age:    44 years          BP:           168/104 mmHg Patient Gender: M                 HR:           90 bpm. Exam Location:  Inpatient Procedure: 2D Echo Indications:    Stroke I63.9  History:        Patient has no prior history  of Echocardiogram examinations.  Sonographer:    Thurman Coyerasey Kirkpatrick RDCS (AE) Referring Phys: [id number] Marvel PlanJINDONG XU IMPRESSIONS  1. Left ventricular ejection fraction, by estimation, is 40 to 45%. The left ventricle has mildly decreased function. The left ventricle demonstrates global hypokinesis. There is mild concentric left ventricular hypertrophy. Left ventricular diastolic parameters are consistent with Grade I diastolic dysfunction (impaired relaxation). Elevated left atrial pressure.  2. Right ventricular systolic function is normal. The right ventricular size is normal.  3. The pericardial effusion is circumferential. There is no evidence of cardiac tamponade.  4. The mitral valve is normal in structure. Trivial mitral valve regurgitation. No evidence of mitral stenosis.  5. The aortic valve is normal in structure. Aortic valve regurgitation is trivial. No aortic stenosis is present.  6. Aortic dilatation noted. There is mild dilatation of the aortic root, measuring 44 mm. There is mild dilatation of the ascending aorta, measuring 42 mm.  7. The inferior vena cava is normal in size with greater than 50% respiratory variability, suggesting right atrial pressure of 3 mmHg. FINDINGS  Left Ventricle: Left ventricular ejection fraction, by estimation, is 40 to 45%. The left ventricle has mildly decreased  function. The left ventricle demonstrates global hypokinesis. The left ventricular internal cavity size was normal in size. There is  mild concentric left ventricular hypertrophy. Left ventricular diastolic parameters are consistent with Grade I diastolic dysfunction (impaired relaxation). Elevated left atrial pressure. Right Ventricle: The right ventricular size is normal. No increase in right ventricular wall thickness. Right ventricular systolic function is normal. Left Atrium: Left atrial size was normal in size. Right Atrium: Right atrial size was normal in size. Pericardium: Trivial pericardial effusion is present. The pericardial effusion is circumferential. There is no evidence of cardiac tamponade. Mitral Valve: The mitral valve is normal in structure. Trivial mitral valve regurgitation. No evidence of mitral valve stenosis. Tricuspid Valve: The tricuspid valve is normal in structure. Tricuspid valve regurgitation is not demonstrated. No evidence of tricuspid stenosis. Aortic Valve: The aortic valve is normal in structure. Aortic valve regurgitation is trivial. No aortic stenosis is present. Aortic valve mean gradient measures 3.0 mmHg. Aortic valve peak gradient measures 5.1 mmHg. Aortic valve area, by VTI measures 3.31 cm. Pulmonic Valve: The pulmonic valve was normal in structure. Pulmonic valve regurgitation is not visualized. No evidence of pulmonic stenosis. Aorta: The aortic root is normal in size and structure and aortic dilatation noted. There is mild dilatation of the aortic root, measuring 44 mm. There is mild dilatation of the ascending aorta, measuring 42 mm. Venous: The inferior vena cava is normal in size with greater than 50% respiratory variability, suggesting right atrial pressure of 3 mmHg. IAS/Shunts: No atrial level shunt detected by color flow Doppler.  LEFT VENTRICLE PLAX 2D LVIDd:         4.60 cm  Diastology LVIDs:         3.40 cm  LV e' medial:   3.81 cm/s LV PW:         1.40 cm   LV E/e' medial: 21.4 LV IVS:        1.40 cm LVOT diam:     2.40 cm LV SV:         64 LV SV Index:   35 LVOT Area:     4.52 cm  RIGHT VENTRICLE TAPSE (M-mode): 2.2 cm LEFT ATRIUM           Index       RIGHT ATRIUM  Index LA diam:      2.00 cm 1.10 cm/m  RA Area:     16.00 cm LA Vol (A2C): 42.7 ml 23.49 ml/m RA Volume:   42.30 ml  23.27 ml/m LA Vol (A4C): 45.1 ml 24.81 ml/m  AORTIC VALVE AV Area (Vmax):    3.97 cm AV Area (Vmean):   3.91 cm AV Area (VTI):     3.31 cm AV Vmax:           113.00 cm/s AV Vmean:          82.900 cm/s AV VTI:            0.193 m AV Peak Grad:      5.1 mmHg AV Mean Grad:      3.0 mmHg LVOT Vmax:         99.20 cm/s LVOT Vmean:        71.700 cm/s LVOT VTI:          0.141 m LVOT/AV VTI ratio: 0.73  AORTA Ao Root diam: 4.40 cm Ao Asc diam:  3.90 cm MITRAL VALVE MV Area (PHT): 4.41 cm    SHUNTS MV Decel Time: 172 msec    Systemic VTI:  0.14 m MV E velocity: 81.50 cm/s  Systemic Diam: 2.40 cm MV A velocity: 85.30 cm/s MV E/A ratio:  0.96 Mihai Croitoru MD Electronically signed by Thurmon Fair MD Signature Date/Time: 10/02/2020/3:08:26 PM    Final    CT HEAD CODE STROKE WO CONTRAST  Result Date: 10/01/2020 CLINICAL DATA:  Code stroke. Acute neuro deficit. Rule out stroke. Left facial droop EXAM: CT HEAD WITHOUT CONTRAST TECHNIQUE: Contiguous axial images were obtained from the base of the skull through the vertex without intravenous contrast. COMPARISON:  None. FINDINGS: Brain: Ventricle size and cerebral volume normal. Well-defined hypodensity left thalamus compatible with chronic infarction. Negative for acute infarct, hemorrhage, mass Vascular: Negative for hyperdense vessel Skull: Negative Sinuses/Orbits: Mild mucosal edema paranasal sinuses. Large right mastoid effusion and middle ear effusion. Left mastoid sinus clear. Other: None ASPECTS ([place] Stroke Program Early CT Score) - Ganglionic level infarction (caudate, lentiform nuclei, internal capsule, insula, M1-M3  cortex): 7 - Supraganglionic infarction (M4-M6 cortex): 3 Total score (0-10 with 10 being normal): 10 IMPRESSION: 1. No acute intracranial abnormality 2. ASPECTS is 10 3. Code stroke imaging results were communicated on 10/01/2020 at 6:19 pm to provider Roda Shutters via text page Electronically Signed   By: Marlan Palau M.D.   On: 10/01/2020 18:20   VAS [place] LOWER EXTREMITY VENOUS (DVT)  Result Date: 10/03/2020  Lower Venous DVT Study Indications: Stroke.  Comparison Study: no prior Performing Technologist: Blanch Media RVS  Examination Guidelines: A complete evaluation includes B-mode imaging, spectral Doppler, color Doppler, and power Doppler as needed of all accessible portions of each vessel. Bilateral testing is considered an integral part of a complete examination. Limited examinations for reoccurring indications may be performed as noted. The reflux portion of the exam is performed with the patient in reverse Trendelenburg.  +---------+---------------+---------+-----------+----------+--------------+ RIGHT    CompressibilityPhasicitySpontaneityPropertiesThrombus Aging +---------+---------------+---------+-----------+----------+--------------+ CFV      Full           Yes      Yes                                 +---------+---------------+---------+-----------+----------+--------------+ SFJ      Full                                                        +---------+---------------+---------+-----------+----------+--------------+  FV Prox  Full                                                        +---------+---------------+---------+-----------+----------+--------------+ FV Mid   Full                                                        +---------+---------------+---------+-----------+----------+--------------+ FV DistalFull                                                        +---------+---------------+---------+-----------+----------+--------------+ PFV      Full                                                         +---------+---------------+---------+-----------+----------+--------------+ POP      Full           Yes      Yes                                 +---------+---------------+---------+-----------+----------+--------------+ PTV      Full                                                        +---------+---------------+---------+-----------+----------+--------------+ PERO     Full                                                        +---------+---------------+---------+-----------+----------+--------------+   +---------+---------------+---------+-----------+----------+--------------+ LEFT     CompressibilityPhasicitySpontaneityPropertiesThrombus Aging +---------+---------------+---------+-----------+----------+--------------+ CFV      Full           Yes      Yes                                 +---------+---------------+---------+-----------+----------+--------------+ SFJ      Full                                                        +---------+---------------+---------+-----------+----------+--------------+ FV Prox  Full                                                        +---------+---------------+---------+-----------+----------+--------------+  FV Mid   Full                                                        +---------+---------------+---------+-----------+----------+--------------+ FV DistalFull                                                        +---------+---------------+---------+-----------+----------+--------------+ PFV      Full                                                        +---------+---------------+---------+-----------+----------+--------------+ POP      Full           Yes      Yes                                 +---------+---------------+---------+-----------+----------+--------------+ PTV      Full                                                         +---------+---------------+---------+-----------+----------+--------------+ PERO     Full                                                        +---------+---------------+---------+-----------+----------+--------------+     Summary: BILATERAL: - No evidence of deep vein thrombosis seen in the lower extremities, bilaterally. - No evidence of superficial venous thrombosis in the lower extremities, bilaterally. -No evidence of popliteal cyst, bilaterally.   *See table(s) above for measurements and observations. Electronically signed by Heath Lark on 10/03/2020 at 4:38:55 PM.    Final    CT ANGIO HEAD NECK W WO CM (CODE STROKE)  Addendum Date: 10/01/2020   ADDENDUM REPORT: 10/01/2020 20:03 ADDENDUM: After further discussion with Dr. 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 Electronically Signed   By: Marlan Palau M.D.   On: 10/01/2020 20:03   Result Date: 10/01/2020 CLINICAL DATA:  Left facial droop EXAM: CT HEAD WITHOUT CONTRAST CT ANGIOGRAPHY HEAD AND NECK TECHNIQUE: Multidetector CT imaging of the head and neck was performed using the standard protocol during bolus administration of intravenous contrast. Multiplanar CT image reconstructions and MIPs were obtained to evaluate the vascular anatomy. Carotid stenosis measurements (when applicable) are obtained utilizing NASCET criteria, using the distal internal carotid diameter as the denominator. CONTRAST:  79mL OMNIPAQUE IOHEXOL 350 MG/ML SOLN COMPARISON:  CT head 10/01/2020 FINDINGS: CTA NECK FINDINGS Aortic arch: Standard branching. Imaged portion shows no evidence of aneurysm or dissection. No significant stenosis of the major arch vessel origins. Right carotid system: Normal right carotid. Negative  for atherosclerotic disease or stenosis. Left carotid system: Normal left carotid. Negative for atherosclerotic disease or stenosis. Vertebral arteries: Both vertebral arteries widely patent without stenosis.  Skeleton: Cervicothoracic scoliosis. No acute skeletal abnormality. Multiple caries. Periapical lucency around right lower molar. Other neck: No mass or adenopathy. 12 mm left thyroid nodule. No further imaging necessary. Upper chest: Lung apices clear bilaterally. Review of the MIP images confirms the above findings CTA HEAD FINDINGS Anterior circulation: Anterior and middle cerebral arteries normal bilaterally. No stenosis or large vessel occlusion. Posterior circulation: Normal posterior circulation. No stenosis or large vessel occlusion. Venous sinuses: Normal venous enhancement Anatomic variants: None Delayed phase: Not performed Review of the MIP images confirms the above findings IMPRESSION: Normal CT angiogram head and neck. No significant atherosclerotic disease. No stenosis or intracranial large vessel occlusion. Electronically Signed: By: Marlan Palau M.D. On: 10/01/2020 19:16    PHYSICAL EXAM  Temp:  [98.5 F (36.9 C)-99.9 F (37.7 C)] 98.5 F (36.9 C) (04/24 1119) Pulse Rate:  [67-94] 67 (04/24 1119) Resp:  [16-20] 18 (04/24 1119) BP: (124-146)/(75-92) 136/79 (04/24 1119) SpO2:  [99 %-100 %] 100 % (04/24 1119)  General - Well nourished, well developed  Ophthalmologic - fundi not visualized due to noncooperation.  Cardiovascular - Regular rhythm and rate.  Neuro - awake alert and oriented  to age, place, time and people. No aphasia, fluent language, following all simple commands. Able to name and repeat.  Mild dysarthria.  no gaze palsy now, visual field full no hemianopia, PERRL.  Left facial droop. Tongue protrusion to the left.  Right upper and lower extremity 4/5, left upper extremity deltoid 0/5 but bicep 2/5 barely, tricep and finger movement 0/5, left lower extremity 2-3/5 proximal and distal PF/DF 0/5.  Sensation decreased on the left, still has LLE sensory neglect.  Right finger-to-nose intact.  Gait not tested.    ASSESSMENT/PLAN Mr. ROMAIN ERION is a 45 y.o. male  with PMH of heart murmur not on medications presented to ER for acute onset left arm and leg weakness, left visual and sensory neglect, right gaze preference, left facial droop nausea/vomiting. tPA given.  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  Discussed with patient regarding risk and benefit of thrombectomy, patient declined thrombectomy.     Stroke:  left MCA infarct, embolic pattern, source unclear   CT head early ischemic changes right MCA but no hypodensity  CTA head neck was done showed right M2/M3 occlusion.   MRI right moderate MCA cortical infarcts   2D Echo EF 40 to 45%  LE venous Doppler no DVT  TEE rescheduled to Monday  LDL 212  HgbA1c 5.3  UDS negative  Lovenox for VTE prophylaxis  No antithrombotic prior to admission, now on aspirin 325 mg daily and clopidogrel 75 mg daily DAPT for 3 months and then aspirin alone given large vessel occlusion intracranially.  Patient counseled to be compliant with his antithrombotic medications  Ongoing aggressive stroke risk factor management  Therapy recommendations: CIR -> SNF  Disposition: Pending, SW working on it  Cardiomyopathy  EF 40 to 45%  TEE rescheduled to Monday  May consider cardiology consult based on TEE result  Hypertensive urgency . Needed labetalol and Cleviprex IV before IV tPA for BP control . Stable on the high end . Off Cleviprex . On amlodipine 10   Long term BP goal normotensive  Hyperlipidemia  Home meds: None  LDL 212, goal < 70  Now on Lipitor  80  Continue statin at discharge  Dysphagia  Mild dysarthria  On dysphagia 2 and thin liquid  intermittent cough improving  Other Stroke Risk Factors  Hx of heart murmur  Other Active Problems  Leukocytosis WBC 10.9-> 8.1->7.4->6.5->10.4  Febrile 38.5 overnight. CXR no infiltrates. UA pending  Hypokalemia K 3.1 ->3.6->3.5  Low vitamin B12 which will be replaced.  Hospital day # 6     To  contact Stroke Continuity provider, please refer to [email or web address]. After hours, contact General Neurology

## 2020-10-07 NOTE — Plan of Care (Signed)
  Problem: Clinical Measurements: Goal: Ability to maintain clinical measurements within normal limits will improve Outcome: Progressing   Problem: Health Behavior/Discharge Planning: Goal: Ability to manage health-related needs will improve Outcome: Progressing   Problem: Education: Goal: Knowledge of General Education information will improve Description: Including pain rating scale, medication(s)/side effects and non-pharmacologic comfort measures Outcome: Progressing   Problem: Clinical Measurements: Goal: Will remain free from infection Outcome: Progressing   

## 2020-10-08 ENCOUNTER — Encounter (HOSPITAL_COMMUNITY): Payer: Self-pay | Admitting: Neurology

## 2020-10-08 ENCOUNTER — Inpatient Hospital Stay (HOSPITAL_COMMUNITY): Payer: Medicaid Other

## 2020-10-08 ENCOUNTER — Encounter (HOSPITAL_COMMUNITY)
Admission: EM | Disposition: A | Discharge status: Skilled Nursing Facility | Payer: Self-pay | Source: Home / Self Care | Attending: Neurology

## 2020-10-08 ENCOUNTER — Inpatient Hospital Stay (HOSPITAL_COMMUNITY): Payer: Medicaid Other | Admitting: Certified Registered"

## 2020-10-08 DIAGNOSIS — I639 Cerebral infarction, unspecified: Secondary | ICD-10-CM

## 2020-10-08 DIAGNOSIS — I313 Pericardial effusion (noninflammatory): Secondary | ICD-10-CM

## 2020-10-08 DIAGNOSIS — I6300 Cerebral infarction due to thrombosis of unspecified precerebral artery: Secondary | ICD-10-CM

## 2020-10-08 HISTORY — PX: TEE WITHOUT CARDIOVERSION: SHX5443

## 2020-10-08 HISTORY — PX: BUBBLE STUDY: SHX6837

## 2020-10-08 LAB — HOMOCYSTEINE: Homocysteine: 5.9 umol/L (ref 0.0–14.5)

## 2020-10-08 SURGERY — ECHOCARDIOGRAM, TRANSESOPHAGEAL
Anesthesia: Monitor Anesthesia Care

## 2020-10-08 MED ORDER — POTASSIUM CHLORIDE 10 MEQ/100ML IV SOLN
10.0000 meq | INTRAVENOUS | Status: AC
  Administered 2020-10-08 (×2): 10 meq via INTRAVENOUS
  Filled 2020-10-08 (×2): qty 100

## 2020-10-08 MED ORDER — POTASSIUM CHLORIDE 10 MEQ/100ML IV SOLN: 10.0000 meq | INTRAVENOUS | Status: DC

## 2020-10-08 MED ORDER — PHENYLEPHRINE 40 MCG/ML (10ML) SYRINGE FOR IV PUSH (FOR BLOOD PRESSURE SUPPORT)
PREFILLED_SYRINGE | INTRAVENOUS | Status: DC | PRN
  Administered 2020-10-08 (×2): 80 ug via INTRAVENOUS

## 2020-10-08 MED ORDER — PROPOFOL 500 MG/50ML IV EMUL
INTRAVENOUS | Status: DC | PRN
  Administered 2020-10-08: 150 ug/kg/min via INTRAVENOUS

## 2020-10-08 MED ORDER — PROPOFOL 10 MG/ML IV BOLUS
INTRAVENOUS | Status: DC | PRN
  Administered 2020-10-08: 90 mg via INTRAVENOUS

## 2020-10-08 MED ORDER — PROPOFOL 10 MG/ML IV BOLUS: INTRAVENOUS | Status: DC | PRN

## 2020-10-08 MED ORDER — ESMOLOL HCL 100 MG/10ML IV SOLN
INTRAVENOUS | Status: DC | PRN
  Administered 2020-10-08: 10 mg via INTRAVENOUS

## 2020-10-08 MED ORDER — SUCCINYLCHOLINE CHLORIDE 20 MG/ML IJ SOLN
INTRAMUSCULAR | Status: DC | PRN
  Administered 2020-10-08: 80 mg via INTRAVENOUS

## 2020-10-08 MED ORDER — SODIUM CHLORIDE 0.9 % IV SOLN: INTRAVENOUS | Status: DC

## 2020-10-08 NOTE — Interval H&P Note (Signed)
History and Physical Interval Note:  10/08/2020 12:05 PM  Harry [name]  has presented today for surgery, with the diagnosis of stroke.  The various methods of treatment have been discussed with the patient and family. After consideration of risks, benefits and other options for treatment, the patient has consented to  Procedure(s): TRANSESOPHAGEAL ECHOCARDIOGRAM (TEE) (N/A) as a surgical intervention.  The patient's history has been reviewed, patient examined, no change in status, stable for surgery.  I have reviewed the patient's chart and labs.  Questions were answered to the patient's satisfaction.     [name]

## 2020-10-08 NOTE — Transfer of Care (Signed)
Immediate Anesthesia Transfer of Care Note  Patient: Harry [name]  Procedure(s) Performed: TRANSESOPHAGEAL ECHOCARDIOGRAM (TEE) (N/A ) BUBBLE STUDY  Patient Location: Endoscopy Unit  Anesthesia Type:General  Level of Consciousness: awake, alert  and oriented  Airway & Oxygen Therapy: Patient Spontanous Breathing and Patient connected to face mask oxygen  Post-op Assessment: Report given to RN and Post -op Vital signs reviewed and stable  Post vital signs: Reviewed and stable  Last Vitals:  Vitals Value Taken Time  BP 164/86 10/08/20 1256  Temp 37.3 C 10/08/20 1307  Pulse 96 10/08/20 1308  Resp 16 10/08/20 1308  SpO2 100 % 10/08/20 1308  Vitals shown include unvalidated device data.  Last Pain:  Vitals:   10/08/20 1307  TempSrc: Oral  PainSc:       Patients Stated Pain Goal: 0 (10/08/20 0800)  Complications: No complications documented.

## 2020-10-08 NOTE — Anesthesia Postprocedure Evaluation (Signed)
Anesthesia Post Note  Patient: Harry [name]  Procedure(s) Performed: TRANSESOPHAGEAL ECHOCARDIOGRAM (TEE) (N/A ) BUBBLE STUDY     Patient location during evaluation: PACU Anesthesia Type: MAC Level of consciousness: awake and alert and oriented Pain management: pain level controlled Vital Signs Assessment: post-procedure vital signs reviewed and stable Respiratory status: spontaneous breathing, nonlabored ventilation and respiratory function stable Cardiovascular status: blood pressure returned to baseline Postop Assessment: no apparent nausea or vomiting Anesthetic complications: no   No complications documented.  Last Vitals:  Vitals:   10/08/20 1320 10/08/20 1411  BP: (!) 167/77 123/74  Pulse: 91 83  Resp: 12 17  Temp:  37.4 C  SpO2: 99% 99%    Last Pain:  Vitals:   10/08/20 1411  TempSrc: Oral  PainSc: 0-No pain                 Brennan Bailey

## 2020-10-08 NOTE — Anesthesia Preprocedure Evaluation (Addendum)
Anesthesia Evaluation  Patient identified by MRN, date of birth, ID band Patient awake    Reviewed: Allergy & Precautions, NPO status , Patient's Chart, lab work & pertinent test results  History of Anesthesia Complications Negative for: history of anesthetic complications  Airway Mallampati: II  TM Distance: >3 FB Neck ROM: Full    Dental  (+) Missing,    Pulmonary neg pulmonary ROS,    Pulmonary exam normal        Cardiovascular hypertension, Normal cardiovascular exam  TTE 10/02/20: EF 40-45%, global hypokinesis, mild LVH, grade I DD, mild dilatation of aortic root measuring 41m, mild dilatation of ascending aorta measuring 469m  Neuro/Psych CVA (10/01/20) negative psych ROS   GI/Hepatic negative GI ROS, Neg liver ROS,   Endo/Other  negative endocrine ROS  Renal/GU negative Renal ROS  negative genitourinary   Musculoskeletal negative musculoskeletal ROS (+)   Abdominal   Peds  Hematology  (+) anemia , Hgb 9.5   Anesthesia Other Findings Day of surgery medications reviewed with patient.  Reproductive/Obstetrics negative OB ROS                            Anesthesia Physical Anesthesia Plan  ASA: IV  Anesthesia Plan: MAC   Post-op Pain Management:    Induction:   PONV Risk Score and Plan: Treatment may vary due to age or medical condition and Propofol infusion  Airway Management Planned: Natural Airway and Nasal Cannula  Additional Equipment: None  Intra-op Plan:   Post-operative Plan:   Informed Consent: I have reviewed the patients History and Physical, chart, labs and discussed the procedure including the risks, benefits and alternatives for the proposed anesthesia with the patient or authorized representative who has indicated his/her understanding and acceptance.       Plan Discussed with: CRNA  Anesthesia Plan Comments:        Anesthesia Quick Evaluation

## 2020-10-08 NOTE — Plan of Care (Signed)
  Problem: Clinical Measurements: Goal: Diagnostic test results will improve Outcome: Progressing   Problem: Clinical Measurements: Goal: Ability to maintain clinical measurements within normal limits will improve Outcome: Progressing   Problem: Health Behavior/Discharge Planning: Goal: Ability to manage health-related needs will improve Outcome: Progressing   

## 2020-10-08 NOTE — Progress Notes (Signed)
Occupational Therapy Treatment Patient Details Name: Harry [name] MRN: [id number] DOB: 01-08-1976 Today's Date: 10/08/2020    History of present illness 45 yo male presenting to ED with left sided weakness. CT showing early ischemic changes around right MCA but no hypodensity. tPA given on 4/18 @1823 . 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 Awaiting MRI. PMH including heart murmur not on medications.   OT comments  Pt making steady progress towards OT goals this session. Pt limited by drowsiness this session after just returning from procedure but agreeable to OT intervention. Pt continues to present with impaired cognition, impaired functional use of LUE, and impaired strength and endurance. Pt currently requires MOD A +2 for bed mobility, and MOD A +2 for sit<>stand from EOB, deferred OOB transfer d/t drowsiness and incontinent BM, but worked on lateral weight shifts in standing with pt able to stand ~ 3 mins with BUE support. Pt requires set- up- MAX A for UB ADLs from bed level, pt additionally able to complete LUE therex as indicated below.  Pt would continue to benefit from skilled occupational therapy while admitted and after d/c to address the below listed limitations in order to improve overall functional mobility and facilitate independence with BADL participation. DC plan remains appropriate, will follow acutely per POC.     Follow Up Recommendations  SNF    Equipment Recommendations  Other (comment) (defer to next venue of care)    Recommendations for Other Services      Precautions / Restrictions Precautions Precautions: Fall;Other (comment) Precaution Comments: LUE hemiparesis, subluxing LUE Restrictions Weight Bearing Restrictions: No       Mobility Bed Mobility Overal bed mobility: Needs Assistance Bed Mobility: Supine to Sit;Rolling;Sidelying to Sit Rolling: Min assist Sidelying to sit: Mod assist;+2 for physical assistance Supine to  sit: Mod assist;HOB elevated;+2 for physical assistance Sit to supine: Mod assist;HOB elevated;+2 for safety/equipment   General bed mobility comments: education provided on using RLE underneath LLE to advance BLEs to EOB, pt required assist to elevate trunk into sitting, MOD A +2 to return to sidelying and MIN A to roll back to backside    Transfers Overall transfer level: Needs assistance Equipment used: 2 person hand held assist Transfers: Sit to/from Stand Sit to Stand: Mod assist;+2 physical assistance         General transfer comment: MOD A +2 to rise into standing, deferred side stepping d/t incontinent BM, worked on lateral weight shifts in standing while NT completed posterior pericare    Balance Overall balance assessment: Needs assistance Sitting-balance support: Feet supported;Single extremity supported Sitting balance-Leahy Scale: Fair Sitting balance - Comments: pt able to maintain static sitting EOB with one UE support with close supervision   Standing balance support: Bilateral upper extremity supported;During functional activity Standing balance-Leahy Scale: Poor Standing balance comment: pt able to stand ~ 3 mins for lateral weight shifts with BUEs supported                         ADL either performed or assessed with clinical judgement   ADL Overall ADL's : Needs assistance/impaired     Grooming: Supervision/safety;Set up Grooming Details (indicate cue type and reason): applying lotion to LUE with RUE from upright position in bed         Upper Body Dressing : Maximal assistance;Sitting Upper Body Dressing Details (indicate cue type and reason): to change gown       Toilet Transfer  Details (indicate cue type and reason): deferred OOB transfer d/t incontinet BM Toileting- Clothing Manipulation and Hygiene: Total assistance;Sit to/from stand Toileting - Clothing Manipulation Details (indicate cue type and reason): psoterior pericare in  standing     Functional mobility during ADLs: Moderate assistance;+2 for physical assistance (sit<>stand only) General ADL Comments: Pt continues to present with decreased funtional use of LUE, cogniton, balance, strength and activity tolerance. session focus on sit<>stand from EOB and functional weight shifting as precursor to ambulation, LUE PROM, and seated UB ADLS     Vision       Perception     Praxis      Cognition Arousal/Alertness: Awake/alert;Lethargic (pt has just returned from TEE therefore limited by drowsy from procedure) Behavior During Therapy: Flat affect Overall Cognitive Status: Impaired/Different from baseline Area of Impairment: Following commands;Safety/judgement;Awareness;Attention;Problem solving                   Current Attention Level: Sustained   Following Commands: Follows one step commands with increased time;Follows multi-step commands inconsistently Safety/Judgement: Decreased awareness of safety;Decreased awareness of deficits Awareness: Emergent Problem Solving: Slow processing;Difficulty sequencing;Requires verbal cues General Comments: pt initially presenting with flat affect but then noted to become tangential with speech spouting off several sentences at a time not pretaining to conversation, limited by drowsiness this session from procedure however pt overall appropriate and seems to be willing to participate in therapy sessions        Exercises General Exercises - Upper Extremity Shoulder Flexion: AAROM;Left;5 reps;Seated Elbow Flexion: AROM;AAROM;Left;10 reps;Seated (1/5) Elbow Extension: AROM;AAROM;Left;10 reps;Seated (1/5) Wrist Flexion: AAROM;Left;10 reps;Seated Wrist Extension: AAROM;Left;10 reps;Seated Digit Composite Flexion: AAROM;Left;10 reps;Seated Composite Extension: AAROM;Left;10 reps;Seated Other Exercises Other Exercises: scapular protraction <>retraction with LUE supported on L knee Other Exercises: unable to  perfrom scapular elevation depression from seated EOB   Shoulder Instructions       General Comments pt reports his brother works 12 pm- 8 pm and he can assist him at night, pt reports he does not think a w/c will fit into his house.    Pertinent Vitals/ Pain       Pain Assessment: No/denies pain  Home Living                                          Prior Functioning/Environment              Frequency  Min 2X/week        Progress Toward Goals  OT Goals(current goals can now be found in the care plan section)  Progress towards OT goals: Progressing toward goals  Acute Rehab OT Goals Patient Stated Goal: to see his niece "faith" OT Goal Formulation: With patient/family Time For Goal Achievement: 10/16/20 Potential to Achieve Goals: Good  Plan Discharge plan remains appropriate;Frequency remains appropriate    Co-evaluation                 AM-PAC OT "6 Clicks" Daily Activity     Outcome Measure   Help from another person eating meals?: A Lot Help from another person taking care of personal grooming?: A Lot Help from another person toileting, which includes using toliet, bedpan, or urinal?: A Lot Help from another person bathing (including washing, rinsing, drying)?: A Lot Help from another person to put on and taking off regular upper body clothing?: A Lot Help from another person to  put on and taking off regular lower body clothing?: Total 6 Click Score: 11    End of Session Equipment Utilized During Treatment: Gait belt  OT Visit Diagnosis: Unsteadiness on feet (R26.81);Other abnormalities of gait and mobility (R26.89);Muscle weakness (generalized) (M62.81);Hemiplegia and hemiparesis Hemiplegia - Right/Left: Left Hemiplegia - dominant/non-dominant: Non-Dominant Hemiplegia - caused by: Cerebral infarction   Activity Tolerance Patient tolerated treatment well;Patient limited by lethargy   Patient Left in bed;with call bell/phone  within reach;with bed alarm set   Nurse Communication Mobility status        Time: 8677-3736 OT Time Calculation (min): 27 min  Charges: OT General Charges $OT Visit: 1 Visit OT Treatments $Therapeutic Activity: 23-37 mins  Lenor Derrick., COTA/L Acute Rehabilitation Services [phone]    Barron Schmid 10/08/2020, 5:10 PM

## 2020-10-08 NOTE — CV Procedure (Signed)
    PROCEDURE NOTE:  Procedure:  Transesophageal echocardiogram Operator:  [name], MD Indications:  CVA Complications: None  During this procedure the patient is administered a total of Propofol 250 mg to achieve and maintain moderate conscious sedation.  The patient's heart rate, blood pressure, and oxygen saturation are monitored continuously during the procedure by anesthesia. The patient became hypoxic with probe intubation and became agitated, hypertensive and tachycardic in ST up to the 140's.  A nasal trumpet was placed with persistence of hypoxia.  The TEE probe was removed and patient was bagged.  There was bloody secretions on the TEE probe.  Hypoxemia persisted and he was emergently intubated by anesthesia.  The blood secretions were felt to be due to nasal irritation of the nasal trumpet.  He was given Esmolol 10mg  and succinylcholine 80mg .  The tachycardia and hypertension improved.  He was reintubated with the TEE probe and procedure was completed.  He did have some hypotension that resolved with 300cc NS.  Results: Normal LV size and mild to moderately reduced LVF with EF 35-40% Normal RV size and function Normal RA Normal LA and LA appendage with spontaneous echo contrast and reduced LAA emptying velocity. Normal TV with mild TR Normal PV with trivial PR Normal MV with mild MR Normal trileaflet AV Patent foramen ovale present with significant bidirectional shunting on agitated saline contrast injection that appeared within 1 cardiac cycle. Normal thoracic and ascending aorta. Small circumferential pericardial effusion was present.   The patient tolerated the procedure well after intubation and was transferred back to their room in stable condition.  Signed: , MD [facility]

## 2020-10-08 NOTE — Anesthesia Procedure Notes (Signed)
Procedure Name: Intubation Date/Time: 10/08/2020 12:22 PM Performed by: Colon Flattery, CRNA Pre-anesthesia Checklist: Patient identified, Emergency Drugs available, Suction available and Patient being monitored Patient Re-evaluated:Patient Re-evaluated prior to induction Oxygen Delivery Method: Circle system utilized Preoxygenation: Pre-oxygenation with 100% oxygen Induction Type: IV induction Ventilation: Mask ventilation without difficulty Grade View: Grade I Tube type: Oral Number of attempts: 1 Airway Equipment and Method: Stylet and Oral airway Placement Confirmation: ETT inserted through vocal cords under direct vision,  positive ETCO2 and breath sounds checked- equal and bilateral Secured at: 22 cm Tube secured with: Tape Dental Injury: Teeth and Oropharynx as per pre-operative assessment

## 2020-10-08 NOTE — Progress Notes (Signed)
Physical Therapy Treatment Patient Details Name: Harry [name] MRN: [id number] DOB: 1975/07/09 Today's Date: 10/08/2020    History of Present Illness 45 yo male presenting to ED with left sided weakness. CT showing early ischemic changes around right MCA but no hypodensity. tPA given on 4/18 @1823 . 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 Awaiting MRI. PMH including heart murmur not on medications.    PT Comments    PT/OT teams trialling 5x/week to maximize pt recovery s/p CVA. Pt very motivated to participate in therapies, and expresses he will have his brother to help him at home once he d/cs from acute setting. Pt tolerated x15 minutes of seated balance assessment and intervention, demonstrating the most difficulty with multidirectional perturbations, multidirectional scooting, and picking up object from behind him. These items demonstrate anticipatory postural adjustment (APA) deficits. Pt also requiring mod-max assist overall for bed mobility, transfer to stand, and x2 steps towards HOB. Session cut short by arrival of endo team. PT to continue to progress pt mobility as tolerated.     Follow Up Recommendations  SNF     Equipment Recommendations  3in1 (PT);Wheelchair cushion (measurements PT);Wheelchair (measurements PT)    Recommendations for Other Services       Precautions / Restrictions Precautions Precautions: Fall;Other (comment) Precaution Comments: LUE hemiparesis, subluxing LUE Restrictions Weight Bearing Restrictions: No    Mobility  Bed Mobility Overal bed mobility: Needs Assistance Bed Mobility: Supine to Sit;Sit to Supine     Supine to sit: Mod assist;HOB elevated Sit to supine: Mod assist;HOB elevated;+2 for safety/equipment   General bed mobility comments: mod +1-2 for supine<>sit for trunk and LE management, scooting to EOB with assist of bed pad, and boost up in bed with bed pads and +2 assist.    Transfers Overall transfer  level: Needs assistance Equipment used: 1 person hand held assist Transfers: Sit to/from Stand Sit to Stand: Mod assist         General transfer comment: mod assist for power up, rise, and steadying, along with LLE blocking. VC for upright chest, tactile facilitation at posterior trunk and pelvis.  Ambulation/Gait Ambulation/Gait assistance: Max assist Gait Distance (Feet): 2 Feet   Gait Pattern/deviations: Step-to pattern;Decreased weight shift to left;Trunk flexed Gait velocity: decr   General Gait Details: max assist for weight shifting laterally for stepping, LLE translation during swing and blocking during stance phase, steadying, bodyweight support. VC for sequencing task. Pt tolerated x2 ft lateral stepping only   Stairs             Wheelchair Mobility    Modified Rankin (Stroke Patients Only) Modified Rankin (Stroke Patients Only) Pre-Morbid Rankin Score: No symptoms Modified Rankin: Moderately severe disability     Balance Overall balance assessment: Needs assistance Sitting-balance support: Feet supported;Single extremity supported Sitting balance-Leahy Scale: Fair Sitting balance - Comments: close guard for safety statically, requires light assist dynamically   Standing balance support: Bilateral upper extremity supported;During functional activity Standing balance-Leahy Scale: Zero Standing balance comment: max assist to reach and maintain standing               High Level Balance Comments: seated tasks during fist: anterior/lateral/posterior nudge, static sitting EO/EC, pick up object from floor and behind, forward and lateral reaching, posterior, anterior, and lateral scooting            Cognition Arousal/Alertness: Awake/alert Behavior During Therapy: Flat affect Overall Cognitive Status: Impaired/Different from baseline Area of Impairment: Following commands;Safety/judgement;Awareness;Attention;Problem solving  Current Attention Level: Sustained   Following Commands: Follows one step commands with increased time;Follows multi-step commands inconsistently Safety/Judgement: Decreased awareness of safety Awareness: Emergent Problem Solving: Slow processing;Difficulty sequencing;Requires verbal cues General Comments: Pt motivated to participate in therapies. Pt requires frequent verbal and tacile cues for mobility, and increased processing time to initiate tasks. Is tangential in conversation at times.      Exercises      General Comments General comments (skin integrity, edema, etc.): FIST seated balance test administered: score 35/56      Pertinent Vitals/Pain Pain Assessment: No/denies pain    Home Living                      Prior Function            PT Goals (current goals can now be found in the care plan section) Acute Rehab PT Goals Patient Stated Goal: to get better PT Goal Formulation: With patient/family Time For Goal Achievement: 10/16/20 Potential to Achieve Goals: Good Progress towards PT goals: Progressing toward goals    Frequency    Min 5X/week      PT Plan Current plan remains appropriate    Co-evaluation              AM-PAC PT "6 Clicks" Mobility   Outcome Measure  Help needed turning from your back to your side while in a flat bed without using bedrails?: A Lot Help needed moving from lying on your back to sitting on the side of a flat bed without using bedrails?: A Lot Help needed moving to and from a bed to a chair (including a wheelchair)?: A Lot Help needed standing up from a chair using your arms (e.g., wheelchair or bedside chair)?: A Lot Help needed to walk in hospital room?: A Lot Help needed climbing 3-5 steps with a railing? : Total 6 Click Score: 11    End of Session Equipment Utilized During Treatment: Gait belt Activity Tolerance: Patient limited by fatigue;Other (comment) (being taken to procedure) Patient left: in  bed;with call bell/phone within reach;with bed alarm set;with nursing/sitter in room Nurse Communication: Mobility status PT Visit Diagnosis: Unsteadiness on feet (R26.81);Other abnormalities of gait and mobility (R26.89);Difficulty in walking, not elsewhere classified (R26.2);Hemiplegia and hemiparesis Hemiplegia - Right/Left: Left Hemiplegia - dominant/non-dominant: Non-dominant Hemiplegia - caused by: Cerebral infarction     Time: 9528-4132 PT Time Calculation (min) (ACUTE ONLY): 30 min  Charges:  $Therapeutic Activity: 8-22 mins $Neuromuscular Re-education: 8-22 mins                    Marye Round, PT DPT Acute Rehabilitation Services Pager [phone]  Office [phone]   Tyrone Apple E Christain [place] 10/08/2020, 2:10 PM

## 2020-10-08 NOTE — Interval H&P Note (Signed)
History and Physical Interval Note:  10/08/2020 11:06 AM  Harry [name]  has presented today for surgery, with the diagnosis of stroke.  The various methods of treatment have been discussed with the patient and family. After consideration of risks, benefits and other options for treatment, the patient has consented to  Procedure(s): TRANSESOPHAGEAL ECHOCARDIOGRAM (TEE) (N/A) as a surgical intervention.  The patient's history has been reviewed, patient examined, no change in status, stable for surgery.  I have reviewed the patient's chart and labs.  Questions were answered to the patient's satisfaction.     [name]

## 2020-10-08 NOTE — Progress Notes (Signed)
STROKE TEAM PROGRESS NOTE   SUBJECTIVE (INTERVAL HISTORY) Patient examined after returning from TEE.  He is lying comfortably in bed.  Continues to have dysarthria and left hemiparesis. TEE today showed a PFO but no clot.  Lower extremity venous Dopplers were negative for DVT vital signs are stable.    OBJECTIVE Temp:  [97.4 F (36.3 C)-99.3 F (37.4 C)] 99.3 F (37.4 C) (04/25 1411) Pulse Rate:  [69-103] 83 (04/25 1411) Cardiac Rhythm: Normal sinus rhythm (04/25 0700) Resp:  [12-23] 17 (04/25 1411) BP: (123-167)/(74-89) 123/74 (04/25 1411) SpO2:  [99 %-100 %] 99 % (04/25 1411) Weight:  [59.6 kg] 59.6 kg (04/25 1147)  No results for input(s): GLUCAP in the last 168 hours. Recent Labs  Lab 10/03/20 0506 10/04/20 0248 10/05/20 0441 10/06/20 0324 10/07/20 0229  NA 139 139 141 138 136  K 3.1* 3.1* 3.6 3.5 3.1*  CL 106 110 110 107 105  CO2 23 21* 24 20* 22  GLUCOSE 93 92 100* 113* 99  BUN 12 11 8 11 11   CREATININE 1.14 1.00 0.98 0.90 1.01  CALCIUM 7.8* 7.4* 7.6* 7.8* 7.5*   Recent Labs  Lab 10/01/20 1805  AST 24  ALT 11  ALKPHOS 44  BILITOT 0.9  PROT 5.0*  ALBUMIN 2.1*   Recent Labs  Lab 10/01/20 1805 10/01/20 1823 10/03/20 0506 10/04/20 0248 10/05/20 0441 10/06/20 0324 10/07/20 0229  WBC 10.9*  --  8.1 7.4 6.5 10.4 13.8*  NEUTROABS 8.4*  --   --   --   --   --   --   HGB 10.7*   < > 10.1* 9.4* 9.6* 10.1* 9.5*  HCT 32.6*   < > 30.2* 28.6* 28.6* 30.1* 28.7*  MCV 88.3  --  86.8 88.8 86.9 87.2 87.2  PLT 347  --  259 272 257 275 244   < > = values in this interval not displayed.   No results for input(s): CKTOTAL, CKMB, CKMBINDEX, TROPONINI in the last 168 hours. No results for input(s): LABPROT, INR in the last 72 hours. Recent Labs    10/06/20 1441  COLORURINE YELLOW  LABSPEC 1.026  PHURINE 5.0  GLUCOSEU 50*  HGBUR MODERATE*  BILIRUBINUR NEGATIVE  KETONESUR NEGATIVE  PROTEINUR >=300*  NITRITE NEGATIVE  LEUKOCYTESUR TRACE*       Component Value  Date/Time   CHOL 297 (H) 10/02/2020 0607   TRIG 62 10/02/2020 0607   HDL 73 10/02/2020 0607   CHOLHDL 4.1 10/02/2020 0607   VLDL 12 10/02/2020 0607   LDLCALC 212 (H) 10/02/2020 0607   Lab Results  Component Value Date   HGBA1C 5.3 10/02/2020      Component Value Date/Time   LABOPIA NONE DETECTED 10/05/2020 1321   COCAINSCRNUR NONE DETECTED 10/05/2020 1321   LABBENZ NONE DETECTED 10/05/2020 1321   AMPHETMU NONE DETECTED 10/05/2020 1321   THCU NONE DETECTED 10/05/2020 1321   LABBARB NONE DETECTED 10/05/2020 1321    No results for input(s): ETH in the last 168 hours.  I have personally reviewed the radiological images below and agree with the radiology interpretations.  MR BRAIN WO CONTRAST  Result Date: 10/02/2020 CLINICAL DATA:  Stroke follow-up.  Status post tPA. EXAM: MRI HEAD WITHOUT CONTRAST TECHNIQUE: Multiplanar, multiecho pulse sequences of the brain and surrounding structures were obtained without intravenous contrast. COMPARISON:  October 01, 2020 CT code stroke. FINDINGS: Brain: Acute infarcts in the anterior and posterior right frontal lobe involving cortex and subcortical white matter. Associated edema with sulcal effacement.  No substantial midline shift. Mild curvilinear susceptibility artifact the regions of infarct likely represent petechial hemorrhage. No hydrocephalus. No mass lesion. 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 No extra-axial fluid collection. Basal cisterns are patent. Vascular: Focal susceptibility artifact within a right M2 branch in the region of the more posterior frontal lobe infarct, compatible with thrombus and known occlusion better characterized on recent CTA. Major arterial flow voids are maintained skull base. Skull and upper cervical spine: No focal marrow replacing lesion. Sinuses/Orbits: Clear sinuses.  Unremarkable orbits. Other: Large right mastoid effusion. IMPRESSION: 1. Acute infarcts in the anterior and posterior right  frontal lobe. Associated edema with sulcal effacement. Mild curvilinear susceptibility artifact the regions of infarct likely represent petechial hemorrhage. No mass occupying acute hemorrhage. 2. Focal susceptibility artifact within a right M2 MCA branch in the region of the more posterior frontal lobe infarct, compatible with thrombus and known occlusion better characterized on recent CTA. 3. 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 4. Large right mastoid effusion. Electronically Signed   By: Feliberto HartsFrederick S Jones MD   On: 10/02/2020 16:38   DG CHEST PORT 1 VIEW  Result Date: 10/06/2020 CLINICAL DATA:  Fevers. EXAM: PORTABLE CHEST 1 VIEW COMPARISON:  10/04/2020 FINDINGS: Stable cardiac enlargement. No pleural effusion or edema. No airspace densities identified. Visualized osseous structures are notable for thoracic scoliosis. IMPRESSION: 1. No acute cardiopulmonary abnormalities. 2. Cardiac enlargement. Electronically Signed   By: Signa Kellaylor  Stroud M.D.   On: 10/06/2020 07:53   DG CHEST PORT 1 VIEW  Result Date: 10/04/2020 CLINICAL DATA:  Fever, stroke, left-sided weakness EXAM: PORTABLE CHEST 1 VIEW COMPARISON:  None. FINDINGS: No consolidation, features of edema, pneumothorax, or effusion. Pulmonary vascularity is normally distributed. The cardiomediastinal contours are unremarkable. No acute osseous or soft tissue abnormality. Telemetry leads overlie the chest. IMPRESSION: No acute cardiopulmonary abnormality. Electronically Signed   By: Kreg ShropshirePrice  DeHay M.D.   On: 10/04/2020 06:21   DG Abd Portable 1V  Result Date: 10/06/2020 CLINICAL DATA:  Ileus. EXAM: PORTABLE ABDOMEN - 1 VIEW COMPARISON:  None. FINDINGS: Air-filled loops of large and small bowel. The colon appears to be normal in caliber. Small bowel loops are mildly dilated. No free air, portal venous gas, or pneumatosis. There is a stool ball in the rectum. No other abnormalities. IMPRESSION: 1. Air-filled loops of large and small  bowel. The small bowel appears to be mildly dilated, out of proportion of the colon. While ileus is possible, findings are concerning for early small bowel obstruction. Electronically Signed   By: Gerome Samavid  Williams III M.D   On: 10/06/2020 17:33   ECHOCARDIOGRAM COMPLETE  Result Date: 10/02/2020    ECHOCARDIOGRAM REPORT   Patient Name:   Bosie HelperLANORRIS P Coffield Date of Exam: 10/02/2020 Medical Rec #:  [id number]         Height:       74.0 in Accession #:    [id number]        Weight:       131.4 lb Date of Birth:  10/08/1975          BSA:          1.818 m Patient Age:    44 years          BP:           168/104 mmHg Patient Gender: M                 HR:  90 bpm. Exam Location:  Inpatient Procedure: 2D Echo Indications:    Stroke I63.9  History:        Patient has no prior history of Echocardiogram examinations.  Sonographer:    Thurman Coyer RDCS (AE) Referring Phys: [id number] Marvel Plan IMPRESSIONS  1. Left ventricular ejection fraction, by estimation, is 40 to 45%. The left ventricle has mildly decreased function. The left ventricle demonstrates global hypokinesis. There is mild concentric left ventricular hypertrophy. Left ventricular diastolic parameters are consistent with Grade I diastolic dysfunction (impaired relaxation). Elevated left atrial pressure.  2. Right ventricular systolic function is normal. The right ventricular size is normal.  3. The pericardial effusion is circumferential. There is no evidence of cardiac tamponade.  4. The mitral valve is normal in structure. Trivial mitral valve regurgitation. No evidence of mitral stenosis.  5. The aortic valve is normal in structure. Aortic valve regurgitation is trivial. No aortic stenosis is present.  6. Aortic dilatation noted. There is mild dilatation of the aortic root, measuring 44 mm. There is mild dilatation of the ascending aorta, measuring 42 mm.  7. The inferior vena cava is normal in size with greater than 50% respiratory variability,  suggesting right atrial pressure of 3 mmHg. FINDINGS  Left Ventricle: Left ventricular ejection fraction, by estimation, is 40 to 45%. The left ventricle has mildly decreased function. The left ventricle demonstrates global hypokinesis. The left ventricular internal cavity size was normal in size. There is  mild concentric left ventricular hypertrophy. Left ventricular diastolic parameters are consistent with Grade I diastolic dysfunction (impaired relaxation). Elevated left atrial pressure. Right Ventricle: The right ventricular size is normal. No increase in right ventricular wall thickness. Right ventricular systolic function is normal. Left Atrium: Left atrial size was normal in size. Right Atrium: Right atrial size was normal in size. Pericardium: Trivial pericardial effusion is present. The pericardial effusion is circumferential. There is no evidence of cardiac tamponade. Mitral Valve: The mitral valve is normal in structure. Trivial mitral valve regurgitation. No evidence of mitral valve stenosis. Tricuspid Valve: The tricuspid valve is normal in structure. Tricuspid valve regurgitation is not demonstrated. No evidence of tricuspid stenosis. Aortic Valve: The aortic valve is normal in structure. Aortic valve regurgitation is trivial. No aortic stenosis is present. Aortic valve mean gradient measures 3.0 mmHg. Aortic valve peak gradient measures 5.1 mmHg. Aortic valve area, by VTI measures 3.31 cm. Pulmonic Valve: The pulmonic valve was normal in structure. Pulmonic valve regurgitation is not visualized. No evidence of pulmonic stenosis. Aorta: The aortic root is normal in size and structure and aortic dilatation noted. There is mild dilatation of the aortic root, measuring 44 mm. There is mild dilatation of the ascending aorta, measuring 42 mm. Venous: The inferior vena cava is normal in size with greater than 50% respiratory variability, suggesting right atrial pressure of 3 mmHg. IAS/Shunts: No atrial  level shunt detected by color flow Doppler.  LEFT VENTRICLE PLAX 2D LVIDd:         4.60 cm  Diastology LVIDs:         3.40 cm  LV e' medial:   3.81 cm/s LV PW:         1.40 cm  LV E/e' medial: 21.4 LV IVS:        1.40 cm LVOT diam:     2.40 cm LV SV:         64 LV SV Index:   35 LVOT Area:     4.52 cm  RIGHT  VENTRICLE TAPSE (M-mode): 2.2 cm LEFT ATRIUM           Index       RIGHT ATRIUM           Index LA diam:      2.00 cm 1.10 cm/m  RA Area:     16.00 cm LA Vol (A2C): 42.7 ml 23.49 ml/m RA Volume:   42.30 ml  23.27 ml/m LA Vol (A4C): 45.1 ml 24.81 ml/m  AORTIC VALVE AV Area (Vmax):    3.97 cm AV Area (Vmean):   3.91 cm AV Area (VTI):     3.31 cm AV Vmax:           113.00 cm/s AV Vmean:          82.900 cm/s AV VTI:            0.193 m AV Peak Grad:      5.1 mmHg AV Mean Grad:      3.0 mmHg LVOT Vmax:         99.20 cm/s LVOT Vmean:        71.700 cm/s LVOT VTI:          0.141 m LVOT/AV VTI ratio: 0.73  AORTA Ao Root diam: 4.40 cm Ao Asc diam:  3.90 cm MITRAL VALVE MV Area (PHT): 4.41 cm    SHUNTS MV Decel Time: 172 msec    Systemic VTI:  0.14 m MV E velocity: 81.50 cm/s  Systemic Diam: 2.40 cm MV A velocity: 85.30 cm/s MV E/A ratio:  0.96 Mihai Croitoru MD Electronically signed by Thurmon Fair MD Signature Date/Time: 10/02/2020/3:08:26 PM    Final    CT HEAD CODE STROKE WO CONTRAST  Result Date: 10/01/2020 CLINICAL DATA:  Code stroke. Acute neuro deficit. Rule out stroke. Left facial droop EXAM: CT HEAD WITHOUT CONTRAST TECHNIQUE: Contiguous axial images were obtained from the base of the skull through the vertex without intravenous contrast. COMPARISON:  None. FINDINGS: Brain: Ventricle size and cerebral volume normal. Well-defined hypodensity left thalamus compatible with chronic infarction. Negative for acute infarct, hemorrhage, mass Vascular: Negative for hyperdense vessel Skull: Negative Sinuses/Orbits: Mild mucosal edema paranasal sinuses. Large right mastoid effusion and middle ear effusion.  Left mastoid sinus clear. Other: None ASPECTS ([place] Stroke Program Early CT Score) - Ganglionic level infarction (caudate, lentiform nuclei, internal capsule, insula, M1-M3 cortex): 7 - Supraganglionic infarction (M4-M6 cortex): 3 Total score (0-10 with 10 being normal): 10 IMPRESSION: 1. No acute intracranial abnormality 2. ASPECTS is 10 3. Code stroke imaging results were communicated on 10/01/2020 at 6:19 pm to provider Roda Shutters via text page Electronically Signed   By: Marlan Palau M.D.   On: 10/01/2020 18:20   VAS [place] LOWER EXTREMITY VENOUS (DVT)  Result Date: 10/03/2020  Lower Venous DVT Study Indications: Stroke.  Comparison Study: no prior Performing Technologist: Blanch Media RVS  Examination Guidelines: A complete evaluation includes B-mode imaging, spectral Doppler, color Doppler, and power Doppler as needed of all accessible portions of each vessel. Bilateral testing is considered an integral part of a complete examination. Limited examinations for reoccurring indications may be performed as noted. The reflux portion of the exam is performed with the patient in reverse Trendelenburg.  +---------+---------------+---------+-----------+----------+--------------+ RIGHT    CompressibilityPhasicitySpontaneityPropertiesThrombus Aging +---------+---------------+---------+-----------+----------+--------------+ CFV      Full           Yes      Yes                                 +---------+---------------+---------+-----------+----------+--------------+  SFJ      Full                                                        +---------+---------------+---------+-----------+----------+--------------+ FV Prox  Full                                                        +---------+---------------+---------+-----------+----------+--------------+ FV Mid   Full                                                         +---------+---------------+---------+-----------+----------+--------------+ FV DistalFull                                                        +---------+---------------+---------+-----------+----------+--------------+ PFV      Full                                                        +---------+---------------+---------+-----------+----------+--------------+ POP      Full           Yes      Yes                                 +---------+---------------+---------+-----------+----------+--------------+ PTV      Full                                                        +---------+---------------+---------+-----------+----------+--------------+ PERO     Full                                                        +---------+---------------+---------+-----------+----------+--------------+   +---------+---------------+---------+-----------+----------+--------------+ LEFT     CompressibilityPhasicitySpontaneityPropertiesThrombus Aging +---------+---------------+---------+-----------+----------+--------------+ CFV      Full           Yes      Yes                                 +---------+---------------+---------+-----------+----------+--------------+ SFJ      Full                                                        +---------+---------------+---------+-----------+----------+--------------+  FV Prox  Full                                                        +---------+---------------+---------+-----------+----------+--------------+ FV Mid   Full                                                        +---------+---------------+---------+-----------+----------+--------------+ FV DistalFull                                                        +---------+---------------+---------+-----------+----------+--------------+ PFV      Full                                                         +---------+---------------+---------+-----------+----------+--------------+ POP      Full           Yes      Yes                                 +---------+---------------+---------+-----------+----------+--------------+ PTV      Full                                                        +---------+---------------+---------+-----------+----------+--------------+ PERO     Full                                                        +---------+---------------+---------+-----------+----------+--------------+     Summary: BILATERAL: - No evidence of deep vein thrombosis seen in the lower extremities, bilaterally. - No evidence of superficial venous thrombosis in the lower extremities, bilaterally. -No evidence of popliteal cyst, bilaterally.   *See table(s) above for measurements and observations. Electronically signed by Heath Lark on 10/03/2020 at 4:38:55 PM.    Final    CT ANGIO HEAD NECK W WO CM (CODE STROKE)  Addendum Date: 10/01/2020   ADDENDUM REPORT: 10/01/2020 20:03 ADDENDUM: After further discussion with Dr. 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 Electronically Signed   By: Marlan Palau M.D.   On: 10/01/2020 20:03   Result Date: 10/01/2020 CLINICAL DATA:  Left facial droop EXAM: CT HEAD WITHOUT CONTRAST CT ANGIOGRAPHY HEAD AND NECK TECHNIQUE: Multidetector CT imaging of the head and neck was performed using the standard protocol during bolus administration of intravenous contrast. Multiplanar CT image reconstructions and MIPs were obtained to evaluate the vascular anatomy. Carotid stenosis measurements (when applicable)  are obtained utilizing NASCET criteria, using the distal internal carotid diameter as the denominator. CONTRAST:  42mL OMNIPAQUE IOHEXOL 350 MG/ML SOLN COMPARISON:  CT head 10/01/2020 FINDINGS: CTA NECK FINDINGS Aortic arch: Standard branching. Imaged portion shows no evidence of aneurysm or dissection. No significant stenosis  of the major arch vessel origins. Right carotid system: Normal right carotid. Negative for atherosclerotic disease or stenosis. Left carotid system: Normal left carotid. Negative for atherosclerotic disease or stenosis. Vertebral arteries: Both vertebral arteries widely patent without stenosis. Skeleton: Cervicothoracic scoliosis. No acute skeletal abnormality. Multiple caries. Periapical lucency around right lower molar. Other neck: No mass or adenopathy. 12 mm left thyroid nodule. No further imaging necessary. Upper chest: Lung apices clear bilaterally. Review of the MIP images confirms the above findings CTA HEAD FINDINGS Anterior circulation: Anterior and middle cerebral arteries normal bilaterally. No stenosis or large vessel occlusion. Posterior circulation: Normal posterior circulation. No stenosis or large vessel occlusion. Venous sinuses: Normal venous enhancement Anatomic variants: None Delayed phase: Not performed Review of the MIP images confirms the above findings IMPRESSION: Normal CT angiogram head and neck. No significant atherosclerotic disease. No stenosis or intracranial large vessel occlusion. Electronically Signed: By: Marlan Palau M.D. On: 10/01/2020 19:16    PHYSICAL EXAM  Temp:  [97.4 F (36.3 C)-99.3 F (37.4 C)] 99.3 F (37.4 C) (04/25 1411) Pulse Rate:  [69-103] 83 (04/25 1411) Resp:  [12-23] 17 (04/25 1411) BP: (123-167)/(74-89) 123/74 (04/25 1411) SpO2:  [99 %-100 %] 99 % (04/25 1411) Weight:  [59.6 kg] 59.6 kg (04/25 1147)  General -frail middle-age African-American male not in distress.  Ophthalmologic - fundi not visualized due to noncooperation.  Cardiovascular - Regular rhythm and rate.  Neuro - awake alert and oriented  to age, place, time and people. No aphasia, mild dysarthria with hypophonia but fluent language, following all simple commands. Able to name and repeat.  Mild dysarthria.  no gaze palsy now, visual field full no hemianopia, PERRL.  Left facial  droop. Tongue protrusion to the left.  Right upper and lower extremity 4/5, left upper extremity deltoid 0/5 but bicep 2/5  , tricep and finger movement 0/5, left lower extremity 2-3/5 proximal and distal PF/DF 0/5.  Sensation decreased on the left, still has LLE sensory neglect.  Right finger-to-nose intact.  Gait not tested.    ASSESSMENT/PLAN Harry [name] is a 45 y.o. male with PMH of heart murmur not on medications presented to ER for acute onset left arm and leg weakness, left visual and sensory neglect, right gaze preference, left facial droop nausea/vomiting. tPA given.  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  Discussed with patient regarding risk and benefit of thrombectomy, patient declined thrombectomy.     Stroke: Right MCA infarct, embolic pattern, source unclear   CT head early ischemic changes right MCA but no hypodensity  CTA head neck was done showed right M2/M3 occlusion.   MRI right moderate MCA cortical infarcts   2D Echo EF 40 to 45%  LE venous Doppler no DVT  TEE no clot but positive for PFO   Lower extremity venous Doppler negative for DVT  LDL 212  HgbA1c 5.3  UDS negative  Lovenox for VTE prophylaxis  No antithrombotic prior to admission, now on aspirin 325 mg daily and clopidogrel 75 mg daily DAPT for 3 months and then aspirin alone given large vessel occlusion intracranially.  Patient counseled to be compliant with his antithrombotic medications  Ongoing aggressive stroke risk  factor management  Therapy recommendations: CIR -> SNF  Disposition: Pending, SW working on it  Cardiomyopathy  EF 40 to 45% TEE negative for intracardiac clot but positive for PFO  hypertensive urgency . Needed labetalol and Cleviprex IV before IV tPA for BP control . Stable on the high end . Off Cleviprex . On amlodipine 10   Long term BP goal normotensive  Hyperlipidemia  Home meds: None  LDL 212, goal < 70  Now on Lipitor  80  Continue statin at discharge  Dysphagia  Mild dysarthria  On dysphagia 2 and thin liquid  intermittent cough improving  Other Stroke Risk Factors  Hx of heart murmur  Other Active Problems  Leukocytosis WBC 10.9-> 8.1->7.4->6.5->10.4  Febrile 38.5 overnight. CXR no infiltrates. UA pending  Hypokalemia K 3.1 ->3.6->3.5  Low vitamin B12 which will be replaced.  Hospital day # 7 I have personally obtained history,examined this patient, reviewed notes, independently viewed imaging studies, participated in medical decision making and plan of care.ROS completed by me personally and pertinent positives fully documented  I have made any additions or clarifications directly to the above note. Agree with note above.  Patient presented with embolic right PCA and MCA infarcts and TEE shows PFO but no definite cardiac source of embolism.  Lower extremity venous Dopplers negative for DVT.  Recommend continue antiplatelet therapy and check TCD bubble study to characterize his right-to-left shunt and may consider elective endovascular PFO closure given his young age.  Greater than 50% time during this 25-minute visit was spent in counseling and coordination of care about his stroke and PFO and discussion with care team. Delia Heady, MD Medical Director [facility] Pager: [phone] 10/08/2020 4:40 PM     To contact Stroke Continuity provider, please refer to [email or web address]. After hours, contact General Neurology

## 2020-10-08 NOTE — Progress Notes (Signed)
  Echocardiogram Echocardiogram Transesophageal has been performed.  Gerda Diss 10/08/2020, 1:14 PM

## 2020-10-09 ENCOUNTER — Inpatient Hospital Stay (HOSPITAL_COMMUNITY): Payer: Medicaid Other

## 2020-10-09 ENCOUNTER — Encounter (HOSPITAL_COMMUNITY): Payer: Self-pay | Admitting: Cardiology

## 2020-10-09 DIAGNOSIS — I639 Cerebral infarction, unspecified: Secondary | ICD-10-CM

## 2020-10-09 LAB — BASIC METABOLIC PANEL
Anion gap: 9 (ref 5–15)
BUN: 10 mg/dL (ref 6–20)
CO2: 24 mmol/L (ref 22–32)
Calcium: 7.6 mg/dL — ABNORMAL LOW (ref 8.9–10.3)
Chloride: 103 mmol/L (ref 98–111)
Creatinine, Ser: 0.98 mg/dL (ref 0.61–1.24)
GFR, Estimated: >60 mL/min (ref >60)
Glucose, Bld: 126 mg/dL — ABNORMAL HIGH (ref 70–99)
Potassium: 3.5 mmol/L (ref 3.5–5.1)
Sodium: 136 mmol/L (ref 135–145)

## 2020-10-09 LAB — URINE CULTURE
Culture: >=100000 — AB
Special Requests: NORMAL

## 2020-10-09 LAB — CARDIOLIPIN ANTIBODIES, IGG, IGM, IGA
Anticardiolipin IgA: <9 APL U/mL (ref 0–11)
Anticardiolipin IgG: <9 GPL U/mL (ref 0–14)
Anticardiolipin IgM: <9 MPL U/mL (ref 0–12)

## 2020-10-09 LAB — HOMOCYSTEINE: Homocysteine: 5.6 umol/L (ref 0.0–14.5)

## 2020-10-09 LAB — PHOSPHORUS: Phosphorus: 3.2 mg/dL (ref 2.5–4.6)

## 2020-10-09 LAB — MAGNESIUM: Magnesium: 1.8 mg/dL (ref 1.7–2.4)

## 2020-10-09 LAB — LUPUS ANTICOAGULANT
DRVVT: 35.7 s (ref 0.0–47.0)
PTT Lupus Anticoagulant: 39.6 s (ref 0.0–51.9)
Thrombin Time: 15.5 s (ref 0.0–23.0)
dPT Confirm Ratio: 1.16 Ratio (ref 0.00–1.34)
dPT: 42.6 s (ref 0.0–47.6)

## 2020-10-09 LAB — ANA W/REFLEX IF POSITIVE: Anti Nuclear Antibody (ANA): NEGATIVE

## 2020-10-09 MED ORDER — IOHEXOL 350 MG/ML SOLN
100.0000 mL | Freq: Once | INTRAVENOUS | Status: AC | PRN
  Administered 2020-10-09: 100 mL via INTRAVENOUS

## 2020-10-09 MED ORDER — CEPHALEXIN 500 MG PO CAPS
500.0000 mg | ORAL_CAPSULE | Freq: Two times a day (BID) | ORAL | Status: AC
  Administered 2020-10-09 – 2020-10-15 (×14): 500 mg via ORAL
  Filled 2020-10-09 (×14): qty 1

## 2020-10-09 NOTE — Progress Notes (Signed)
Physical Therapy Treatment Patient Details Name: Harry [name] MRN: [id number] DOB: 09-22-1975 Today's Date: 10/09/2020    History of Present Illness 45 yo male presenting to ED with left sided weakness. CT showing early ischemic changes around right MCA but no hypodensity. tPA given on 4/18 @1823 . 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 Awaiting MRI. PMH including heart murmur not on medications.    PT Comments    Pt up in chair after working with OT. Utilized Stedy to work on L sided weightbearing. Pt requires modAx2 for initial sit<>stand and min A-min guard from higher Stedy pads. Pt with standing bouts from 3 min to 30 sec progressing to min guard assist with knees blocked with Stedy. Pt with c/o of nausea and dizziness so returned to sitting in recliner. Pt able to participate in seated exercise and AAROM of L LE. Per OT w/c will not fit in his home, so will need to progress to mod I for return home. PT will continue to work towards that goal for d/c unless SNF level rehab obtained.     Follow Up Recommendations  SNF     Equipment Recommendations  3in1 (PT);Wheelchair cushion (measurements PT);Wheelchair (measurements PT)       Precautions / Restrictions Precautions Precautions: Fall;Other (comment) Precaution Comments: LUE hemiparesis, subluxing LUE Restrictions Weight Bearing Restrictions: No    Mobility  Bed Mobility Overal bed mobility: Needs Assistance Bed Mobility: Supine to Sit     Supine to sit: Mod assist;HOB elevated     General bed mobility comments: OOB in recliner    Transfers Overall transfer level: Needs assistance Equipment used: 2 person hand held assist Transfers: Sit to/from Stand Sit to Stand: Mod assist;Min assist;Min guard;+2 physical assistance Stand pivot transfers: Max assist;+2 physical assistance       General transfer comment: initial stand from recliner level modAx2, additional standing bouts min Ax2 or min  guard   Modified Rankin (Stroke Patients Only) Modified Rankin (Stroke Patients Only) Pre-Morbid Rankin Score: No symptoms Modified Rankin: Moderately severe disability     Balance Overall balance assessment: Needs assistance Sitting-balance support: Feet supported;Single extremity supported Sitting balance-Leahy Scale: Fair Sitting balance - Comments: close guard for safety statically, requires light assist dynamically   Standing balance support: Bilateral upper extremity supported;During functional activity Standing balance-Leahy Scale: Poor Standing balance comment: able to progress to min guard for appox 30 sec with use of Stedy                            Cognition Arousal/Alertness: Awake/alert Behavior During Therapy: Flat affect Overall Cognitive Status: Impaired/Different from baseline Area of Impairment: Following commands;Safety/judgement;Awareness;Attention;Problem solving                   Current Attention Level: Sustained   Following Commands: Follows one step commands with increased time;Follows multi-step commands inconsistently Safety/Judgement: Decreased awareness of safety Awareness: Emergent Problem Solving: Slow processing;Difficulty sequencing;Requires verbal cues General Comments: pt requires agreeable to working with therapy but with c/o of nausea dizziness      Exercises General Exercises - Upper Extremity Shoulder Flexion: Left;Seated;Self ROM;10 reps Elbow Flexion: Left;10 reps;Seated;AAROM (1/5) Elbow Extension: AROM;AAROM;Left;10 reps;Seated (1/5) Wrist Flexion: Left;10 reps;Seated;Self ROM Wrist Extension: Left;10 reps;Seated;Self ROM Digit Composite Flexion: Left;10 reps;Seated;Self ROM Composite Extension: Left;10 reps;Seated;Self ROM General Exercises - Lower Extremity Long Arc Quad: AAROM;Left;10 reps;AROM;Right;Seated Hip ABduction/ADduction: Strengthening;Left;10 reps;Right;Seated Hip Flexion/Marching: AAROM;Left;10  reps;AROM;Right;Seated Heel Raises: AAROM;Left;10  reps;Right;Seated Other Exercises Other Exercises: standing with weightshift to L x 5 bouts varying from 3 min with L knee blocked by Antony Salmon to 30 sec with PT assist for L knee extension    General Comments General comments (skin integrity, edema, etc.): Pt reports nausea and dizziness with longer standing bouts      Pertinent Vitals/Pain Pain Assessment: No/denies pain           PT Goals (current goals can now be found in the care plan section) Acute Rehab PT Goals Patient Stated Goal: to get better PT Goal Formulation: With patient/family Time For Goal Achievement: 10/16/20 Potential to Achieve Goals: Good Progress towards PT goals: Progressing toward goals    Frequency    Min 5X/week      PT Plan Current plan remains appropriate       AM-PAC PT "6 Clicks" Mobility   Outcome Measure  Help needed turning from your back to your side while in a flat bed without using bedrails?: A Lot Help needed moving from lying on your back to sitting on the side of a flat bed without using bedrails?: A Lot Help needed moving to and from a bed to a chair (including a wheelchair)?: A Lot Help needed standing up from a chair using your arms (e.g., wheelchair or bedside chair)?: A Lot Help needed to walk in hospital room?: A Lot Help needed climbing 3-5 steps with a railing? : Total 6 Click Score: 11    End of Session Equipment Utilized During Treatment: Gait belt Activity Tolerance: Patient limited by fatigue Patient left: in bed;with call bell/phone within reach;with bed alarm set;with nursing/sitter in room Nurse Communication: Mobility status PT Visit Diagnosis: Unsteadiness on feet (R26.81);Other abnormalities of gait and mobility (R26.89);Difficulty in walking, not elsewhere classified (R26.2);Hemiplegia and hemiparesis Hemiplegia - Right/Left: Left Hemiplegia - dominant/non-dominant: Non-dominant Hemiplegia - caused by:  Cerebral infarction     Time: 1101-1130 PT Time Calculation (min) (ACUTE ONLY): 29 min  Charges:  $Therapeutic Exercise: 8-22 mins $Therapeutic Activity: 8-22 mins                     Nyleah Mcginnis B. Beverely Risen PT, DPT Acute Rehabilitation Services Pager [phone] Office [phone]    Elon Alas Fleet 10/09/2020, 12:28 PM

## 2020-10-09 NOTE — Progress Notes (Signed)
Occupational Therapy Treatment Patient Details Name: Harry [name] MRN: [id number] DOB: [birth year] Today's Date: 10/09/2020    History of present illness 45 yo male presenting to ED with left sided weakness. CT showing early ischemic changes around right MCA but no hypodensity. tPA given on 4/18 @1823 . 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 Awaiting MRI. PMH including heart murmur not on medications.   OT comments  Pt making steady progress towards OT goals this session. Pt continues to present with decreased activity tolerance, impaired strength, L sided weakness and impaired balance. Pt currently requires MOD A +1 for bed mobility and MAX A +2 for stand pivot transfer from EOB >recliner going to pts L side. Worked on standing balance with pt able to stand ~ 1 min for lateral weight shifts with MOD A +2 for standing balance. Pt completed therex as indicated below with good carryover. Continued education on using LUE as much as possible and completing PROM to LUE during day. Pt would continue to benefit from skilled occupational therapy while admitted and after d/c to address the below listed limitations in order to improve overall functional mobility and facilitate independence with BADL participation. DC plan remains appropriate, will follow acutely per POC.     Follow Up Recommendations  SNF    Equipment Recommendations  Other (comment) (defer to next venue of care)    Recommendations for Other Services      Precautions / Restrictions Precautions Precautions: Fall;Other (comment) Precaution Comments: LUE hemiparesis, subluxing LUE Restrictions Weight Bearing Restrictions: No       Mobility Bed Mobility Overal bed mobility: Needs Assistance Bed Mobility: Supine to Sit     Supine to sit: Mod assist;HOB elevated     General bed mobility comments: pt able to carryover education of using RLE underneath LLE to advance BLEs to EOB but ultimately needed  MOD A +1 to scoot hips forward to EOB and elevate trunk into sitting    Transfers Overall transfer level: Needs assistance Equipment used: 2 person hand held assist Transfers: Sit to/from Sit to Stand: Mod assist;+2 physical assistance Stand pivot transfers: Max assist;+2 physical assistance       General transfer comment: MOD A +2 to rise into standing from EOB, pt able to pivot to recliner to pts L side with MAX A +2, pt required assist to advance BLEs as bilateral knees buckling during transfer    Balance Overall balance assessment: Needs assistance Sitting-balance support: Feet supported;Single extremity supported Sitting balance-Leahy Scale: Fair Sitting balance - Comments: pt able to maintain static sitting EOB with one UE support with close supervision   Standing balance support: Bilateral upper extremity supported;During functional activity   Standing balance comment: pt able to stand ~ 1 min for lateral weight shifts with BUEs supported,  cues to elevate trunk and shift hips anteriorly during stand                           ADL either performed or assessed with clinical judgement   ADL Overall ADL's : Needs assistance/impaired     Grooming: Wash/dry face;Sitting;Supervision/safety;Set up Grooming Details (indicate cue type and reason): wiping face with RUE     Lower Body Bathing: Min guard;Sitting/lateral leans Lower Body Bathing Details (indicate cue type and reason): pt able to reach to LB from EOB for simulated LB bathing tasks with min guard assist     Lower Body Dressing: Min  guard;Sitting/lateral leans Lower Body Dressing Details (indicate cue type and reason): pt able to reach to LB to adjust socks from EOB with min guard assist for safety when reahing out of BOS Toilet Transfer: Maximal assistance;+2 for physical assistance;Stand-pivot Toilet Transfer Details (indicate cue type and reason): simulated via stand pivot  transfer from EOB >recliner to pts L side         Functional mobility during ADLs: Moderate assistance;Maximal assistance;+2 for physical assistance (sit<>stand and stand pivot only) General ADL Comments: session focus on sit<>stand from EOB,stand pivot transfer from EOB >recliner, LUE PROM  and functional weight shifting as precursor to ambulation     Vision       Perception     Praxis      Cognition Arousal/Alertness: Awake/alert Behavior During Therapy: WFL for tasks assessed/performed Overall Cognitive Status: Impaired/Different from baseline Area of Impairment: Attention;Following commands;Safety/judgement;Awareness;Problem solving                   Current Attention Level: Sustained   Following Commands: Follows one step commands with increased time;Follows multi-step commands inconsistently Safety/Judgement: Decreased awareness of safety;Decreased awareness of deficits Awareness: Emergent Problem Solving: Slow processing;Difficulty sequencing;Requires verbal cues          Exercises General Exercises - Upper Extremity Shoulder Flexion: Left;Seated;Self ROM;10 reps Elbow Flexion: Left;10 reps;Seated;AAROM (1/5) Elbow Extension: AROM;AAROM;Left;10 reps;Seated (1/5) Wrist Flexion: Left;10 reps;Seated;Self ROM Wrist Extension: Left;10 reps;Seated;Self ROM Digit Composite Flexion: Left;10 reps;Seated;Self ROM Composite Extension: Left;10 reps;Seated;Self ROM   Shoulder Instructions       General Comments      Pertinent Vitals/ Pain       Pain Assessment: No/denies pain  Home Living                                          Prior Functioning/Environment              Frequency  Min 2X/week        Progress Toward Goals  OT Goals(current goals can now be found in the care plan section)  Progress towards OT goals: Progressing toward goals  Acute Rehab OT Goals Patient Stated Goal: to lay down OT Goal Formulation: With  patient/family Time For Goal Achievement: 10/16/20 Potential to Achieve Goals: Fair  Plan Discharge plan remains appropriate;Frequency remains appropriate    Co-evaluation                 AM-PAC OT "6 Clicks" Daily Activity     Outcome Measure   Help from another person eating meals?: A Little Help from another person taking care of personal grooming?: A Little Help from another person toileting, which includes using toliet, bedpan, or urinal?: A Lot Help from another person bathing (including washing, rinsing, drying)?: A Lot Help from another person to put on and taking off regular upper body clothing?: A Lot Help from another person to put on and taking off regular lower body clothing?: A Lot 6 Click Score: 14    End of Session Equipment Utilized During Treatment: Gait belt  OT Visit Diagnosis: Unsteadiness on feet (R26.81);Other abnormalities of gait and mobility (R26.89);Muscle weakness (generalized) (M62.81);Hemiplegia and hemiparesis Hemiplegia - Right/Left: Left Hemiplegia - dominant/non-dominant: Non-Dominant Hemiplegia - caused by: Cerebral infarction   Activity Tolerance Patient tolerated treatment well   Patient Left in chair;with call bell/phone within reach;with chair alarm set   Nurse Communication  Mobility status        Time: 1000-1045 OT Time Calculation (min): 45 min  Charges: OT General Charges $OT Visit: 1 Visit OT Treatments $Therapeutic Activity: 38-52 mins  Lenor Derrick., COTA/L Acute Rehabilitation Services [phone]    Barron Schmid 10/09/2020, 12:05 PM

## 2020-10-09 NOTE — Progress Notes (Signed)
TCD bubble study has been completed.   Preliminary results in CV Proc.   Blanch Media 10/09/2020 3:01 PM

## 2020-10-09 NOTE — Progress Notes (Signed)
STROKE TEAM PROGRESS NOTE   SUBJECTIVE (INTERVAL HISTORY) No acute events.  Vital signs are stable.  Neurological exam unchanged.  TEE yesterday had shown PFO.  TCD bubble study done today at the bedside confirmed small PFO. ANA panel is negative.  Lupus anticoagulant is negative.  Anticardiolipin antibodies and homocystine levels are pending OBJECTIVE Temp:  [98.1 F (36.7 C)-100.6 F (38.1 C)] 98.2 F (36.8 C) (04/26 0745) Pulse Rate:  [73-103] 73 (04/26 0745) Cardiac Rhythm: Normal sinus rhythm (04/25 1901) Resp:  [12-23] 18 (04/26 0745) BP: (123-167)/(72-86) 132/76 (04/26 0745) SpO2:  [98 %-100 %] 98 % (04/26 0347) Weight:  [59.6 kg] 59.6 kg (04/25 1147)  No results for input(s): GLUCAP in the last 168 hours. Recent Labs  Lab 10/03/20 0506 10/04/20 0248 10/05/20 0441 10/06/20 0324 10/07/20 0229  NA 139 139 141 138 136  K 3.1* 3.1* 3.6 3.5 3.1*  CL 106 110 110 107 105  CO2 23 21* 24 20* 22  GLUCOSE 93 92 100* 113* 99  BUN 12 11 8 11 11   CREATININE 1.14 1.00 0.98 0.90 1.01  CALCIUM 7.8* 7.4* 7.6* 7.8* 7.5*   No results for input(s): AST, ALT, ALKPHOS, BILITOT, PROT, ALBUMIN in the last 168 hours. Recent Labs  Lab 10/03/20 0506 10/04/20 0248 10/05/20 0441 10/06/20 0324 10/07/20 0229  WBC 8.1 7.4 6.5 10.4 13.8*  HGB 10.1* 9.4* 9.6* 10.1* 9.5*  HCT 30.2* 28.6* 28.6* 30.1* 28.7*  MCV 86.8 88.8 86.9 87.2 87.2  PLT 259 272 257 275 244   No results for input(s): CKTOTAL, CKMB, CKMBINDEX, TROPONINI in the last 168 hours. No results for input(s): LABPROT, INR in the last 72 hours. Recent Labs    10/06/20 1441  COLORURINE YELLOW  LABSPEC 1.026  PHURINE 5.0  GLUCOSEU 50*  HGBUR MODERATE*  BILIRUBINUR NEGATIVE  KETONESUR NEGATIVE  PROTEINUR >=300*  NITRITE NEGATIVE  LEUKOCYTESUR TRACE*       Component Value Date/Time   CHOL 297 (H) 10/02/2020 0607   TRIG 62 10/02/2020 0607   HDL 73 10/02/2020 0607   CHOLHDL 4.1 10/02/2020 0607   VLDL 12 10/02/2020 0607    LDLCALC 212 (H) 10/02/2020 0607   Lab Results  Component Value Date   HGBA1C 5.3 10/02/2020      Component Value Date/Time   LABOPIA NONE DETECTED 10/05/2020 1321   COCAINSCRNUR NONE DETECTED 10/05/2020 1321   LABBENZ NONE DETECTED 10/05/2020 1321   AMPHETMU NONE DETECTED 10/05/2020 1321   THCU NONE DETECTED 10/05/2020 1321   LABBARB NONE DETECTED 10/05/2020 1321    No results for input(s): ETH in the last 168 hours.  I have personally reviewed the radiological images below and agree with the radiology interpretations.  MR BRAIN WO CONTRAST  Result Date: 10/02/2020 CLINICAL DATA:  Stroke follow-up.  Status post tPA. EXAM: MRI HEAD WITHOUT CONTRAST TECHNIQUE: Multiplanar, multiecho pulse sequences of the brain and surrounding structures were obtained without intravenous contrast. COMPARISON:  October 01, 2020 CT code stroke. FINDINGS: Brain: Acute infarcts in the anterior and posterior right frontal lobe involving cortex and subcortical white matter. Associated edema with sulcal effacement. No substantial midline shift. Mild curvilinear susceptibility artifact the regions of infarct likely represent petechial hemorrhage. No hydrocephalus. No mass lesion. 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 No extra-axial fluid collection. Basal cisterns are patent. Vascular: Focal susceptibility artifact within a right M2 branch in the region of the more posterior frontal lobe infarct, compatible with thrombus and known occlusion better characterized on recent CTA.  Major arterial flow voids are maintained skull base. Skull and upper cervical spine: No focal marrow replacing lesion. Sinuses/Orbits: Clear sinuses.  Unremarkable orbits. Other: Large right mastoid effusion. IMPRESSION: 1. Acute infarcts in the anterior and posterior right frontal lobe. Associated edema with sulcal effacement. Mild curvilinear susceptibility artifact the regions of infarct likely represent petechial hemorrhage. No  mass occupying acute hemorrhage. 2. Focal susceptibility artifact within a right M2 MCA branch in the region of the more posterior frontal lobe infarct, compatible with thrombus and known occlusion better characterized on recent CTA. 3. 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 4. Large right mastoid effusion. Electronically Signed   By: Feliberto Harts MD   On: 10/02/2020 16:38   DG CHEST PORT 1 VIEW  Result Date: 10/06/2020 CLINICAL DATA:  Fevers. EXAM: PORTABLE CHEST 1 VIEW COMPARISON:  10/04/2020 FINDINGS: Stable cardiac enlargement. No pleural effusion or edema. No airspace densities identified. Visualized osseous structures are notable for thoracic scoliosis. IMPRESSION: 1. No acute cardiopulmonary abnormalities. 2. Cardiac enlargement. Electronically Signed   By: Signa Kell M.D.   On: 10/06/2020 07:53   DG CHEST PORT 1 VIEW  Result Date: 10/04/2020 CLINICAL DATA:  Fever, stroke, left-sided weakness EXAM: PORTABLE CHEST 1 VIEW COMPARISON:  None. FINDINGS: No consolidation, features of edema, pneumothorax, or effusion. Pulmonary vascularity is normally distributed. The cardiomediastinal contours are unremarkable. No acute osseous or soft tissue abnormality. Telemetry leads overlie the chest. IMPRESSION: No acute cardiopulmonary abnormality. Electronically Signed   By: Kreg Shropshire M.D.   On: 10/04/2020 06:21   DG Abd Portable 1V  Result Date: 10/06/2020 CLINICAL DATA:  Ileus. EXAM: PORTABLE ABDOMEN - 1 VIEW COMPARISON:  None. FINDINGS: Air-filled loops of large and small bowel. The colon appears to be normal in caliber. Small bowel loops are mildly dilated. No free air, portal venous gas, or pneumatosis. There is a stool ball in the rectum. No other abnormalities. IMPRESSION: 1. Air-filled loops of large and small bowel. The small bowel appears to be mildly dilated, out of proportion of the colon. While ileus is possible, findings are concerning for early small bowel  obstruction. Electronically Signed   By: Gerome Sam III M.D   On: 10/06/2020 17:33   ECHOCARDIOGRAM COMPLETE  Result Date: 10/02/2020    ECHOCARDIOGRAM REPORT   Patient Name:   Harry [name] Date of Exam: 10/02/2020 Medical Rec #:  [id number]         Height:       74.0 in Accession #:    [id number]        Weight:       131.4 lb Date of Birth:  05-18-1976          BSA:          1.818 m Patient Age:    44 years          BP:           168/104 mmHg Patient Gender: M                 HR:           90 bpm. Exam Location:  Inpatient Procedure: 2D Echo Indications:    Stroke I63.9  History:        Patient has no prior history of Echocardiogram examinations.  Sonographer:    Thurman Coyer RDCS (AE) Referring Phys: [id number] Marvel Plan IMPRESSIONS  1. Left ventricular ejection fraction, by estimation, is 40 to 45%. The left ventricle has mildly  decreased function. The left ventricle demonstrates global hypokinesis. There is mild concentric left ventricular hypertrophy. Left ventricular diastolic parameters are consistent with Grade I diastolic dysfunction (impaired relaxation). Elevated left atrial pressure.  2. Right ventricular systolic function is normal. The right ventricular size is normal.  3. The pericardial effusion is circumferential. There is no evidence of cardiac tamponade.  4. The mitral valve is normal in structure. Trivial mitral valve regurgitation. No evidence of mitral stenosis.  5. The aortic valve is normal in structure. Aortic valve regurgitation is trivial. No aortic stenosis is present.  6. Aortic dilatation noted. There is mild dilatation of the aortic root, measuring 44 mm. There is mild dilatation of the ascending aorta, measuring 42 mm.  7. The inferior vena cava is normal in size with greater than 50% respiratory variability, suggesting right atrial pressure of 3 mmHg. FINDINGS  Left Ventricle: Left ventricular ejection fraction, by estimation, is 40 to 45%. The left ventricle has mildly  decreased function. The left ventricle demonstrates global hypokinesis. The left ventricular internal cavity size was normal in size. There is  mild concentric left ventricular hypertrophy. Left ventricular diastolic parameters are consistent with Grade I diastolic dysfunction (impaired relaxation). Elevated left atrial pressure. Right Ventricle: The right ventricular size is normal. No increase in right ventricular wall thickness. Right ventricular systolic function is normal. Left Atrium: Left atrial size was normal in size. Right Atrium: Right atrial size was normal in size. Pericardium: Trivial pericardial effusion is present. The pericardial effusion is circumferential. There is no evidence of cardiac tamponade. Mitral Valve: The mitral valve is normal in structure. Trivial mitral valve regurgitation. No evidence of mitral valve stenosis. Tricuspid Valve: The tricuspid valve is normal in structure. Tricuspid valve regurgitation is not demonstrated. No evidence of tricuspid stenosis. Aortic Valve: The aortic valve is normal in structure. Aortic valve regurgitation is trivial. No aortic stenosis is present. Aortic valve mean gradient measures 3.0 mmHg. Aortic valve peak gradient measures 5.1 mmHg. Aortic valve area, by VTI measures 3.31 cm. Pulmonic Valve: The pulmonic valve was normal in structure. Pulmonic valve regurgitation is not visualized. No evidence of pulmonic stenosis. Aorta: The aortic root is normal in size and structure and aortic dilatation noted. There is mild dilatation of the aortic root, measuring 44 mm. There is mild dilatation of the ascending aorta, measuring 42 mm. Venous: The inferior vena cava is normal in size with greater than 50% respiratory variability, suggesting right atrial pressure of 3 mmHg. IAS/Shunts: No atrial level shunt detected by color flow Doppler.  LEFT VENTRICLE PLAX 2D LVIDd:         4.60 cm  Diastology LVIDs:         3.40 cm  LV e' medial:   3.81 cm/s LV PW:          1.40 cm  LV E/e' medial: 21.4 LV IVS:        1.40 cm LVOT diam:     2.40 cm LV SV:         64 LV SV Index:   35 LVOT Area:     4.52 cm  RIGHT VENTRICLE TAPSE (M-mode): 2.2 cm LEFT ATRIUM           Index       RIGHT ATRIUM           Index LA diam:      2.00 cm 1.10 cm/m  RA Area:     16.00 cm LA Vol (A2C): 42.7 ml 23.49 ml/m  RA Volume:   42.30 ml  23.27 ml/m LA Vol (A4C): 45.1 ml 24.81 ml/m  AORTIC VALVE AV Area (Vmax):    3.97 cm AV Area (Vmean):   3.91 cm AV Area (VTI):     3.31 cm AV Vmax:           113.00 cm/s AV Vmean:          82.900 cm/s AV VTI:            0.193 m AV Peak Grad:      5.1 mmHg AV Mean Grad:      3.0 mmHg LVOT Vmax:         99.20 cm/s LVOT Vmean:        71.700 cm/s LVOT VTI:          0.141 m LVOT/AV VTI ratio: 0.73  AORTA Ao Root diam: 4.40 cm Ao Asc diam:  3.90 cm MITRAL VALVE MV Area (PHT): 4.41 cm    SHUNTS MV Decel Time: 172 msec    Systemic VTI:  0.14 m MV E velocity: 81.50 cm/s  Systemic Diam: 2.40 cm MV A velocity: 85.30 cm/s MV E/A ratio:  0.96 Mihai Croitoru MD Electronically signed by Thurmon Fair MD Signature Date/Time: 10/02/2020/3:08:26 PM    Final    CT HEAD CODE STROKE WO CONTRAST  Result Date: 10/01/2020 CLINICAL DATA:  Code stroke. Acute neuro deficit. Rule out stroke. Left facial droop EXAM: CT HEAD WITHOUT CONTRAST TECHNIQUE: Contiguous axial images were obtained from the base of the skull through the vertex without intravenous contrast. COMPARISON:  None. FINDINGS: Brain: Ventricle size and cerebral volume normal. Well-defined hypodensity left thalamus compatible with chronic infarction. Negative for acute infarct, hemorrhage, mass Vascular: Negative for hyperdense vessel Skull: Negative Sinuses/Orbits: Mild mucosal edema paranasal sinuses. Large right mastoid effusion and middle ear effusion. Left mastoid sinus clear. Other: None ASPECTS ([place]berta Stroke Program Early CT Score) - Ganglionic level infarction (caudate, lentiform nuclei, internal capsule, insula,  M1-M3 cortex): 7 - Supraganglionic infarction (M4-M6 cortex): 3 Total score (0-10 with 10 being normal): 10 IMPRESSION: 1. No acute intracranial abnormality 2. ASPECTS is 10 3. Code stroke imaging results were communicated on 10/01/2020 at 6:19 pm to provider Roda Shutters via text page Electronically Signed   By: Marlan Palau M.D.   On: 10/01/2020 18:20   VAS [place] LOWER EXTREMITY VENOUS (DVT)  Result Date: 10/03/2020  Lower Venous DVT Study Indications: Stroke.  Comparison Study: no prior Performing Technologist: Blanch Media RVS  Examination Guidelines: A complete evaluation includes B-mode imaging, spectral Doppler, color Doppler, and power Doppler as needed of all accessible portions of each vessel. Bilateral testing is considered an integral part of a complete examination. Limited examinations for reoccurring indications may be performed as noted. The reflux portion of the exam is performed with the patient in reverse Trendelenburg.  +---------+---------------+---------+-----------+----------+--------------+ RIGHT    CompressibilityPhasicitySpontaneityPropertiesThrombus Aging +---------+---------------+---------+-----------+----------+--------------+ CFV      Full           Yes      Yes                                 +---------+---------------+---------+-----------+----------+--------------+ SFJ      Full                                                        +---------+---------------+---------+-----------+----------+--------------+  FV Prox  Full                                                        +---------+---------------+---------+-----------+----------+--------------+ FV Mid   Full                                                        +---------+---------------+---------+-----------+----------+--------------+ FV DistalFull                                                        +---------+---------------+---------+-----------+----------+--------------+ PFV      Full                                                         +---------+---------------+---------+-----------+----------+--------------+ POP      Full           Yes      Yes                                 +---------+---------------+---------+-----------+----------+--------------+ PTV      Full                                                        +---------+---------------+---------+-----------+----------+--------------+ PERO     Full                                                        +---------+---------------+---------+-----------+----------+--------------+   +---------+---------------+---------+-----------+----------+--------------+ LEFT     CompressibilityPhasicitySpontaneityPropertiesThrombus Aging +---------+---------------+---------+-----------+----------+--------------+ CFV      Full           Yes      Yes                                 +---------+---------------+---------+-----------+----------+--------------+ SFJ      Full                                                        +---------+---------------+---------+-----------+----------+--------------+ FV Prox  Full                                                        +---------+---------------+---------+-----------+----------+--------------+  FV Mid   Full                                                        +---------+---------------+---------+-----------+----------+--------------+ FV DistalFull                                                        +---------+---------------+---------+-----------+----------+--------------+ PFV      Full                                                        +---------+---------------+---------+-----------+----------+--------------+ POP      Full           Yes      Yes                                 +---------+---------------+---------+-----------+----------+--------------+ PTV      Full                                                         +---------+---------------+---------+-----------+----------+--------------+ PERO     Full                                                        +---------+---------------+---------+-----------+----------+--------------+     Summary: BILATERAL: - No evidence of deep vein thrombosis seen in the lower extremities, bilaterally. - No evidence of superficial venous thrombosis in the lower extremities, bilaterally. -No evidence of popliteal cyst, bilaterally.   *See table(s) above for measurements and observations. Electronically signed by Heath Lark on 10/03/2020 at 4:38:55 PM.    Final    CT ANGIO HEAD NECK W WO CM (CODE STROKE)  Addendum Date: 10/01/2020   ADDENDUM REPORT: 10/01/2020 20:03 ADDENDUM: After further discussion with Dr. 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 Electronically Signed   By: Marlan Palau M.D.   On: 10/01/2020 20:03   Result Date: 10/01/2020 CLINICAL DATA:  Left facial droop EXAM: CT HEAD WITHOUT CONTRAST CT ANGIOGRAPHY HEAD AND NECK TECHNIQUE: Multidetector CT imaging of the head and neck was performed using the standard protocol during bolus administration of intravenous contrast. Multiplanar CT image reconstructions and MIPs were obtained to evaluate the vascular anatomy. Carotid stenosis measurements (when applicable) are obtained utilizing NASCET criteria, using the distal internal carotid diameter as the denominator. CONTRAST:  79mL OMNIPAQUE IOHEXOL 350 MG/ML SOLN COMPARISON:  CT head 10/01/2020 FINDINGS: CTA NECK FINDINGS Aortic arch: Standard branching. Imaged portion shows no evidence of aneurysm or dissection. No significant stenosis of the major arch vessel origins. Right carotid system: Normal right carotid. Negative  for atherosclerotic disease or stenosis. Left carotid system: Normal left carotid. Negative for atherosclerotic disease or stenosis. Vertebral arteries: Both vertebral arteries widely patent without  stenosis. Skeleton: Cervicothoracic scoliosis. No acute skeletal abnormality. Multiple caries. Periapical lucency around right lower molar. Other neck: No mass or adenopathy. 12 mm left thyroid nodule. No further imaging necessary. Upper chest: Lung apices clear bilaterally. Review of the MIP images confirms the above findings CTA HEAD FINDINGS Anterior circulation: Anterior and middle cerebral arteries normal bilaterally. No stenosis or large vessel occlusion. Posterior circulation: Normal posterior circulation. No stenosis or large vessel occlusion. Venous sinuses: Normal venous enhancement Anatomic variants: None Delayed phase: Not performed Review of the MIP images confirms the above findings IMPRESSION: Normal CT angiogram head and neck. No significant atherosclerotic disease. No stenosis or intracranial large vessel occlusion. Electronically Signed: By: Marlan Palau M.D. On: 10/01/2020 19:16    PHYSICAL EXAM  Temp:  [98.1 F (36.7 C)-100.6 F (38.1 C)] 98.2 F (36.8 C) (04/26 0745) Pulse Rate:  [73-103] 73 (04/26 0745) Resp:  [12-23] 18 (04/26 0745) BP: (123-167)/(72-86) 132/76 (04/26 0745) SpO2:  [98 %-100 %] 98 % (04/26 0347) Weight:  [59.6 kg] 59.6 kg (04/25 1147)  General -frail middle-age African-American male not in distress.  Ophthalmologic - fundi not visualized due to noncooperation.  Cardiovascular - Regular rhythm and rate.  Neuro - awake alert and oriented  to age, place, time and people. No aphasia, mild dysarthria with hypophonia but fluent language, following all simple commands. Able to name and repeat.  Mild dysarthria.  no gaze palsy now, visual field full no hemianopia, PERRL.  Left facial droop. Tongue protrusion to the left.  Right upper and lower extremity 4/5, left upper extremity deltoid 0/5 but bicep 2/5  , tricep and finger movement 0/5, left lower extremity 2-3/5 proximal and distal PF/DF 0/5.  Sensation decreased on the left, still has LLE sensory neglect.   Right finger-to-nose intact.  Gait not tested.    ASSESSMENT/PLAN [name] is a 45 y.o. male with PMH of heart murmur not on medications presented to ER for acute onset left arm and leg weakness, left visual and sensory neglect, right gaze preference, left facial droop nausea/vomiting. tPA given.  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  Discussed with patient regarding risk and benefit of thrombectomy, patient declined thrombectomy.     Stroke: Right MCA infarct, embolic pattern, source unclear   CT head early ischemic changes right MCA but no hypodensity  CTA head neck was done showed right M2/M3 occlusion.   MRI right moderate MCA cortical infarcts   2D Echo EF 40 to 45%  LE venous Doppler no DVT  TEE no clot but positive for PFO   TCD bubble study positive for small (Spencer grade 2 ) right-to-left shunt  Lower extremity venous Doppler negative for DVT  LDL 212  HgbA1c 5.3  UDS negative  Lovenox for VTE prophylaxis  No antithrombotic prior to admission, now on aspirin 325 mg daily and clopidogrel 75 mg daily DAPT for 3 months and then aspirin alone given large vessel occlusion intracranially.  Patient counseled to be compliant with his antithrombotic medications  Ongoing aggressive stroke risk factor management  Therapy recommendations: SNF   Disposition: SNF  Cardiomyopathy  EF 40 to 45% TEE negative for intracardiac clot but positive for PFO  hypertensive urgency . Needed labetalol and Cleviprex IV before IV tPA for BP control . Stable on the high end .  Off Cleviprex . On amlodipine 10   Long term BP goal normotensive  Hyperlipidemia  Home meds: None  LDL 212, goal < 70  Now on Lipitor 80  Continue statin at discharge  Dysphagia  Mild dysarthria  On dysphagia 2 and thin liquid  intermittent cough improving  Other Stroke Risk Factors  Hx of heart murmur  Klebsiella UTI  Leukocytosis WBC 10.9->  8.1->7.4->6.5->10.4->13.8  Febrile 38.5->38.1  Discussed with 3W pharmacist. She discussed with [facility] pharmacy to decide upon treatment: Keflex 500mg  po q 12 hours x 5-7 days    Other Active Problems  Hypokalemia K 3.1 ->3.6->3.5->3.1->pending  Hospital day # 8 Continue present management with aspirin and Plavix for 3 months and aggressive risk factor modification.  We will consider outpatient PFO closure evaluation by cardiologist after his recovery from rehab.  Medically stable to transfer to rehab when bed available.  Greater than 50% time during this 25-minute visit was spent on counseling and coordination of care about his stroke answering questions and discussion with care team.  Delia Heady, MD    To contact Stroke Continuity provider, please refer to [email or web address]. After hours, contact General Neurology

## 2020-10-09 NOTE — Progress Notes (Signed)
  Speech Language Pathology Treatment: Dysphagia;Cognitive-Linquistic  Patient Details Name: Harry [name] MRN: [id number] DOB: September 12, 1975 Today's Date: 10/09/2020 Time: 4098-1191 SLP Time Calculation (min) (ACUTE ONLY): 25 min  Assessment / Plan / Recommendation Clinical Impression  Continued dysphagia and cognitive intervention. Pt assessed with finely chopped and thin liquid meal with upgraded mechanical soft texture. Pt did exhibit some mild left sided pocketing with POs but was able to adequately clear with multiple swallows, thin liquid alternation, and left lingual sweep. Pt still requiring mild to moderate cues for recall of safe swallowing strategies. Recommend diet advancement to dysphagia 3 (mechanical soft) and thin liquids with full supervision for reinforcing swallowing strategies. Pt oriented to place and situation. Pt participated in verbal tasks for functional problem solving given new deficits since CVA. Recommend 24 hour care for safety at DC.    HPI HPI: 45 y.o. RH-male with history of heart murmur who was admitted on 10/01/2020 with acute onset of left hemiparesis with sensory deficits,  right gaze preference and N/V.  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      SLP Plan  Continue with current plan of care       Recommendations  Diet recommendations: Dysphagia 3 (mechanical soft);Thin liquid Liquids provided via: Straw Medication Administration: Whole meds with liquid Supervision: Patient able to self feed;Staff to assist with self feeding;Full supervision/cueing for compensatory strategies Compensations: Lingual sweep for clearance of pocketing;Small sips/bites;Slow rate;Minimize environmental distractions;Monitor for anterior loss;Follow solids with liquid Postural Changes and/or Swallow Maneuvers: Seated upright 90 degrees                Oral Care  Recommendations: Oral care BID Follow up Recommendations: 24 hour supervision/assistance;Skilled Nursing facility SLP Visit Diagnosis: Dysphagia, oral phase (R13.11);Dysphagia, unspecified (R13.10) Plan: Continue with current plan of care       GO               Ardyth Gal MA, CCC-SLP Acute Rehabilitation Services   10/09/2020, 9:36 AM

## 2020-10-09 NOTE — Consult Note (Addendum)
CARDIOLOGY CONSULT NOTE  Patient ID: Harry [name] MRN: [id number] DOB/AGE: [birth year] 45 y.o.  Admit date: 10/01/2020 Referring Physician  Delia Heady, MD Primary Physician:  Pcp, No Reason for Consultation  Stroke and please evaluate for paradoxical embolism  Patient ID: Harry [name], male    DOB: 1975/10/25, 45 y.o.   MRN: [id number]  Chief Complaint  Patient presents with  . Code Stroke   HPI:    Harry [name]  is a 45 y.o. who is unemployed, presently not on any medications at home, admitted on 10/01/2020 with new onset left arm and leg weakness, nausea and vomiting and was immediately noticed by his brother who lives with him and activated the EMS and brought to the emergency room and patient received tPA for acute stroke and was also found to be hypertensive on admission with a blood pressure of 192/128 mmHg.  He underwent TEE on 10/08/2020 and found to have right to left shunting and TCD bubble study only showed very minimal right to left shunting.  I was consulted to see whether paradoxical embolus contributed to his stroke and for possible PFO closure.  Fortunately patient is alert and oriented, is able to give me all the history, still has mild to moderate amount of left-sided weakness.  He is presently denying any chest pain or dyspnea.  Although hypertensive, previously not on any medication, patient is a non-smoker and nondiabetic.  Past Medical History:  Diagnosis Date  . Hypertension    Past Surgical History:  Procedure Laterality Date  . BUBBLE STUDY  10/08/2020   Procedure: BUBBLE STUDY;  Surgeon: Quintella Reichert, MD;  Location: [facility] ENDOSCOPY;  Service: Cardiovascular;;  . TEE WITHOUT CARDIOVERSION N/A 10/08/2020   Procedure: TRANSESOPHAGEAL ECHOCARDIOGRAM (TEE);  Surgeon: Quintella Reichert, MD;  Location: [facility] ENDOSCOPY;  Service: Cardiovascular;  Laterality: N/A;   Social History   Tobacco Use  . Smoking status: Never Smoker  . Smokeless tobacco: Never Used   Substance Use Topics  . Alcohol use: Not on file    Family History  Problem Relation Age of Onset  . Cervical cancer Mother   . Stroke Father   . Heart Problems Sister        died from blood clot  . Diabetes Brother     Marital Sttus: Single  ROS  Review of Systems  Constitutional: Negative.  Cardiovascular: Negative for chest pain, dyspnea on exertion and leg swelling.  Skin: Negative.   Musculoskeletal: Positive for muscle weakness (left leg and arm).  Gastrointestinal: Negative.  Negative for melena.  Genitourinary: Negative.   Neurological: Positive for focal weakness. Negative for seizures.  Psychiatric/Behavioral: Negative.   All other systems reviewed and are negative.  Objective   Vitals with BMI 10/09/2020 10/09/2020 10/09/2020  Height - - -  Weight - - -  BMI - - -  Systolic 141 123 106  Diastolic 81 93 76  Pulse 83 89 73    Blood pressure (!) 141/81, pulse 83, temperature 98.2 F (36.8 C), resp. rate 18, height 5\' 7"  (1.702 m), weight 59.6 kg, SpO2 100 %.    Physical Exam Constitutional:      General: He is not in acute distress. HENT:     Mouth/Throat:     Mouth: Mucous membranes are moist.  Cardiovascular:     Rate and Rhythm: Normal rate and regular rhythm.     Pulses: Normal pulses.     Heart sounds: Normal heart sounds. No murmur heard.  Pulmonary:     Effort: Pulmonary effort is normal.     Breath sounds: Normal breath sounds.  Abdominal:     General: Abdomen is flat.     Palpations: Abdomen is soft.  Musculoskeletal:        General: No swelling or tenderness.     Cervical back: Normal range of motion and neck supple.  Skin:    General: Skin is warm and dry.     Capillary Refill: Capillary refill takes less than 2 seconds.  Neurological:     Mental Status: He is alert.     Comments: Left hemiplegia noted  Psychiatric:        Mood and Affect: Mood normal.    Laboratory examination:   Recent Labs    10/06/20 0324 10/07/20 0229  10/09/20 1424  NA 138 136 136  K 3.5 3.1* 3.5  CL 107 105 103  CO2 20* 22 24  GLUCOSE 113* 99 126*  BUN 11 11 10   CREATININE 0.90 1.01 0.98  CALCIUM 7.8* 7.5* 7.6*  GFRNONAA >60 >60 >60   estimated creatinine clearance is 79.4 mL/min (by C-G formula based on SCr of 0.98 mg/dL).  CMP Latest Ref Rng & Units 10/09/2020 10/07/2020 10/06/2020  Glucose 70 - 99 mg/dL 10/08/2020) 99 119(E)  BUN 6 - 20 mg/dL 10 11 11   Creatinine 0.61 - 1.24 mg/dL 174(Y 8.14  Sodium 135 - 145 mmol/L 136 136 138  Potassium 3.5 - 5.1 mmol/L 3.5 3.1(L) 3.5  Chloride 98 - 111 mmol/L 103 105 107  CO2 22 - 32 mmol/L 24 22 20(L)  Calcium 8.9 - 10.3 mg/dL 7.6(L) 7.5(L) 7.8(L)  Total Protein 6.5 - 8.1 g/dL - - -  Total Bilirubin 0.3 - 1.2 mg/dL - - -  Alkaline Phos 38 - 126 U/L - - -  AST 15 - 41 U/L - - -  ALT 0 - 44 U/L - - -   CBC Latest Ref Rng & Units 10/07/2020 10/06/2020 10/05/2020  WBC 4.0 - 10.5 K/uL 13.8(H) 10.4 6.5  Hemoglobin 13.0 - 17.0 g/dL 10/08/2020) 10.1(L) 9.6(L)  Hematocrit 39.0 - 52.0 % 28.7(L) 30.1(L) 28.6(L)  Platelets 150 - 400 K/uL 244 275 257   Lipid Panel Recent Labs    10/02/20 0607  CHOL 297*  TRIG 62  LDLCALC 212*  VLDL 12  HDL 73  CHOLHDL 4.1    HEMOGLOBIN A1C Lab Results  Component Value Date   HGBA1C 5.3 10/02/2020   MPG 105.41 10/02/2020   TSH Recent Labs    10/06/20 1648  TSH 1.055    Medications and allergies  No Known Allergies   No outpatient medications have been marked as taking for the 10/01/20 encounter [facility] Encounter).    Scheduled Meds: .  stroke: mapping our early stages of recovery book   Does not apply Once  . amLODipine  10 mg Oral Daily  . aspirin EC  325 mg Oral Daily  . atorvastatin  80 mg Oral Daily  . cephALEXin  500 mg Oral Q12H  . chlorhexidine  15 mL Mouth Rinse BID  . Chlorhexidine Gluconate Cloth  6 each Topical Daily  . clopidogrel  75 mg Oral Daily  . enoxaparin (LOVENOX) injection  30 mg Subcutaneous Q24H  . mouth rinse  15 mL  Mouth Rinse q12n4p  . pantoprazole sodium  40 mg Oral QHS   Continuous Infusions: PRN Meds:.acetaminophen **OR** acetaminophen (TYLENOL) oral liquid 160 mg/5 mL **OR** acetaminophen, labetalol, polyethylene glycol, senna-docusate  I/O last 3 completed shifts: In: 1390 [P.O.:890; I.V.:300; IV Piggyback:200] Out: 1600 [Urine:1600] Total I/O In: 120 [P.O.:120] Out: 500 [Urine:500]    Radiology:   MRI of the brain 10/02/2020: 1. Acute infarcts in the anterior and posterior right frontal lobe. Associated edema with sulcal effacement. Mild curvilinear susceptibility artifact the regions of infarct likely represent petechial hemorrhage. No mass occupying acute hemorrhage. 2. Focal susceptibility artifact within a right M2 MCA branch in the region of the more posterior frontal lobe infarct, compatible with thrombus and known occlusion better characterized on recent CTA. 3. 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 4. Large right mastoid effusion.  VAS [place] TRANSCRANIAL DOPPLER W BUBBLES  Result Date: 10/09/2020  Transcranial Doppler with Bubble Patient Name:  Harry [name]  Date of Exam:   10/09/2020 Medical Rec #: [id number]          Accession #:    [id number] Date of Birth: 23-Jul-1975           Patient Gender: M Patient Age:   44Y Exam Location:  [facility] Procedure:      VAS [place] TRANSCRANIAL Demetrios Isaacs Referring Phys: [id number] PRAMOD S SETHI --------------------------------------------------------------------------------  Indications: Stroke. Comparison Study: no prior Performing Technologist: Blanch Media RVS  Examination Guidelines: A complete evaluation includes B-mode imaging, spectral Doppler, color Doppler, and power Doppler as needed of all accessible portions of each vessel. Bilateral testing is considered an integral part of a complete examination. Limited examinations for reoccurring indications may be performed as noted.  Summary:  A vascular evaluation was  performed. The right middle cerebral artery was studied. An IV was inserted into the patient's right forearm . Verbal informed consent was obtained.  HITS heard at rest & valsalva: spencer grade 2 *See table(s) above for TCD measurements and observations.    Preliminary     Cardiac Studies:   Echocardiogram 10/02/2020:  1. Left ventricular ejection fraction, by estimation, is 40 to 45%. The left ventricle has mildly decreased function. The left ventricle demonstrates global hypokinesis. There is mild concentric left ventricular hypertrophy. Left ventricular diastolic  parameters are consistent with Grade I diastolic dysfunction (impaired relaxation). Elevated left atrial pressure.  2. Right ventricular systolic function is normal. The right ventricular size is normal.  3. The pericardial effusion is circumferential. There is no evidence of cardiac tamponade.  4. The mitral valve is normal in structure. Trivial mitral valve regurgitation. No evidence of mitral stenosis.  5. The aortic valve is normal in structure. Aortic valve regurgitation is trivial. No aortic stenosis is present.  6. Aortic dilatation noted. There is mild dilatation of the aortic root, measuring 44 mm. There is mild dilatation of the ascending aorta, measuring 42 mm.  7. The inferior vena cava is normal in size with greater than 50% respiratory variability, suggesting right atrial pressure of 3 mmHg.  Lower extremity venous duplex 10/03/2020: BILATERAL: - No evidence of deep vein thrombosis seen in the lower extremities, bilaterally. - No evidence of superficial venous thrombosis in the lower extremities, bilaterally. -No evidence of popliteal cyst, bilaterally.  TEE 10/09/2020: Preliminary, personally reviewed by me. LVEF appears to be mildly depressed, EF around 45% with global hypokinesis.  No significant valvular abnormality.  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  TCD bubble study pulmonary 10/09/2020: A vascular evaluation was performed. The right middle cerebral artery was  studied.  An IV was inserted into the patient's right forearm . Verbal  informed consent was obtained.  HITS heard at rest & valsalva: spencer grade 2  *See table(s) above for TCD measurements and observations.     EKG:  EKG 10/01/2020: Normal sinus rhythm at rate of 90 bpm, normal axis, LVH by Cain Saupe criteria.  Nonspecific T abnormality, cannot exclude lateral ischemia.  Normal QT interval.  No prior EKG to compare.  Abnormal EKG.  Assessment   1.  Embolic stroke MRI revealing acute infarcts in the anterior and posterior right frontal lobe.  Also remote left thalamic and left cerebellar lacunar infarcts. 2.  Hypertension with hypertensive heart disease 3.  Cardiomyopathy, suspect nonischemic cardiomyopathy related to uncontrolled and untreated hypertension 4.  Hypercholesterolemia  Recommendations:   I have personally reviewed the TEE images, I do not suspect that he has a PFO, even if he did it is probably very small as I do see very minimal amount of double contrast crossing the septum.  But there is a large amount of right to left shunting coming from the left posterior lower left atrium suggestive of pulmonary AV fistula and hence would recommend CT angiogram of the chest first and if negative, then could consider setting him up for intracardiac echo guided therapy and possible repair of ASD/PFO if present.  We could also perform right heart catheterization at the same time.  With regard to hypertension, he has now been started on medications.  He is also on high intensity high-dose statins for markedly elevated LDL.  He has not had any atrial fibrillation episodes while in the hospital however in view of underlying hypertension, cardiomyopathy, would recommend at least a 2-week extended EKG monitoring to exclude A. fib if the  chest CT does not reveal significant AV shunting.  We will continue to follow peripherally for now.  I thank Dr. Pearlean Brownie for having involved me in the care of the patient. I personally also reviewed the TEE images with Dr. Pearlean Brownie. This was a >110 minute consult/coordination and complex decision making.   Yates Decamp, MD, [facility] 10/09/2020, 5:11 PM Office: [phone]

## 2020-10-10 LAB — LIPOPROTEIN A (LPA): Lipoprotein (a): <8.4 nmol/L (ref <75.0)

## 2020-10-10 MED ORDER — ASPIRIN 81 MG PO CHEW
81.0000 mg | CHEWABLE_TABLET | Freq: Every day | ORAL | Status: DC
  Administered 2020-10-10 – 2020-11-02 (×24): 81 mg via ORAL
  Filled 2020-10-10 (×24): qty 1

## 2020-10-10 MED ORDER — ASPIRIN EC 81 MG PO TBEC: 81.0000 mg | DELAYED_RELEASE_TABLET | Freq: Every day | ORAL | Status: DC

## 2020-10-10 NOTE — Progress Notes (Signed)
Physical Therapy Treatment Patient Details Name: Harry [name] MRN: [id number] DOB: 1975-11-15 Today's Date: 10/10/2020    History of Present Illness 45 yo male presenting to ED with left sided weakness. CT showing early ischemic changes around right MCA but no hypodensity. tPA given on 4/18 @1823 . 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 Awaiting MRI. PMH including heart murmur not on medications.    PT Comments    Focus of session on standing balance, and weightshift onto L LE for pregait training. Pt with very flat affect today, agreeable to participate but not focused on task at hand. Asked probing questions to identify motivating factors. Pt reports he played basketball in highschool but other than that does not relate a whole lot of interests. Worked with pt in [place] at window to increase standing time, able to progress to min guard for approximately 1 min. Followed by work in [place] to steady in standing and work on self controlled L knee extension. Once seated in recliner worked on [employer] and strengthening exercise. Will trial three musketeer ambulation tomorrow.     Follow Up Recommendations  SNF     Equipment Recommendations  3in1 (PT);Wheelchair cushion (measurements PT);Wheelchair (measurements PT)       Precautions / Restrictions Precautions Precautions: Fall;Other (comment) Precaution Comments: LUE hemiparesis, subluxing LUE Restrictions Weight Bearing Restrictions: No    Mobility  Bed Mobility Overal bed mobility: Needs Assistance Bed Mobility: Supine to Sit   Sidelying to sit: Min assist Supine to sit: Min assist;HOB elevated     General bed mobility comments: pt able to manage LE to EoB, and requires light min A for bringing trunk to upright and for pad scoot of hips to EoB    Transfers Overall transfer level: Needs assistance Equipment used: Rolling walker (2 wheeled);Ambulation equipment used Transfers: Sit to/from Stand Sit to Stand:  Min guard;+2 physical assistance;+2 safety/equipment;Mod assist;Min assist         General transfer comment: minAx2 for standing from bed initially, min guard for standing from elevated Stedy pads, modAx2 for standing from recliner to RW. vc for chest up and bottom tucked under, blocking at L knee, with cues for knee extension. pt able to move L LE and then R LE for wider BoS, stood for 45 sec before increasing fatigue, cues for reaching back with R UE to chair arm rest and increased eccentric control to sit in recliner     Modified Rankin (Stroke Patients Only) Modified Rankin (Stroke Patients Only) Pre-Morbid Rankin Score: No symptoms Modified Rankin: Moderately severe disability     Balance Overall balance assessment: Needs assistance Sitting-balance support: Feet supported;Single extremity supported Sitting balance-Leahy Scale: Fair Sitting balance - Comments: close guard for safety statically, requires light assist dynamically   Standing balance support: Bilateral upper extremity supported;During functional activity Standing balance-Leahy Scale: Fair Standing balance comment: able to progress to min guard for appox 1 min with use of Stedy and diversion of looking out the window                            Cognition Arousal/Alertness: Awake/alert Behavior During Therapy: Flat affect Overall Cognitive Status: Impaired/Different from baseline Area of Impairment: Following commands;Safety/judgement;Awareness;Attention;Problem solving                   Current Attention Level: Sustained   Following Commands: Follows one step commands with increased time;Follows multi-step commands inconsistently Safety/Judgement: Decreased awareness of  safety Awareness: Emergent Problem Solving: Slow processing;Difficulty sequencing;Requires verbal cues General Comments: pt very flat today, pt agreeable to participate but with difficulty focusing on task at hand       Exercises General Exercises - Upper Extremity Shoulder Flexion: Left;Seated;Self ROM;10 reps Elbow Flexion: Left;10 reps;Seated;AAROM Elbow Extension: AROM;AAROM;Left;10 reps;Seated General Exercises - Lower Extremity Long Arc Quad: AAROM;Left;10 reps;AROM;Right;Seated Heel Slides: AROM;Left;10 reps;Seated Hip ABduction/ADduction: Strengthening;Left;10 reps;Right;Seated Hip Flexion/Marching: AAROM;Left;10 reps;AROM;Right;Seated Heel Raises: AAROM;Left;10 reps;Right;Seated Other Exercises Other Exercises: standing weightshift to L LE x 10 Other Exercises: cross body reaching L and R x 5 each    General Comments General comments (skin integrity, edema, etc.): VSS, reports being as sick as a dog but does not exhibit any discomfort      Pertinent Vitals/Pain Pain Assessment: No/denies pain Faces Pain Scale: Hurts a little bit Pain Location: general discomfort from feeling nauseous and dizzy Pain Descriptors / Indicators: Discomfort;Grimacing Pain Intervention(s): Limited activity within patient's tolerance;Monitored during session;Repositioned           PT Goals (current goals can now be found in the care plan section) Acute Rehab PT Goals Patient Stated Goal: none stated today PT Goal Formulation: With patient/family Time For Goal Achievement: 10/16/20 Potential to Achieve Goals: Good Progress towards PT goals: Progressing toward goals    Frequency    Min 5X/week      PT Plan Current plan remains appropriate       AM-PAC PT "6 Clicks" Mobility   Outcome Measure  Help needed turning from your back to your side while in a flat bed without using bedrails?: A Lot Help needed moving from lying on your back to sitting on the side of a flat bed without using bedrails?: A Lot Help needed moving to and from a bed to a chair (including a wheelchair)?: A Lot Help needed standing up from a chair using your arms (e.g., wheelchair or bedside chair)?: A Lot Help needed to walk  in hospital room?: A Lot Help needed climbing 3-5 steps with a railing? : Total 6 Click Score: 11    End of Session Equipment Utilized During Treatment: Gait belt Activity Tolerance: Patient limited by fatigue Patient left: in bed;with call bell/phone within reach;with bed alarm set;with nursing/sitter in room Nurse Communication: Mobility status PT Visit Diagnosis: Unsteadiness on feet (R26.81);Other abnormalities of gait and mobility (R26.89);Difficulty in walking, not elsewhere classified (R26.2);Hemiplegia and hemiparesis Hemiplegia - Right/Left: Left Hemiplegia - dominant/non-dominant: Non-dominant Hemiplegia - caused by: Cerebral infarction     Time: 1428-1510 PT Time Calculation (min) (ACUTE ONLY): 42 min  Charges:  $Therapeutic Exercise: 23-37 mins $Therapeutic Activity: 8-22 mins                     Elyon Zoll B. Beverely Risen PT, DPT Acute Rehabilitation Services Pager [phone] Office [phone]    Elon Alas Fleet 10/10/2020, 3:31 PM

## 2020-10-10 NOTE — Progress Notes (Signed)
STROKE TEAM PROGRESS NOTE   SUBJECTIVE (INTERVAL HISTORY) No acute events.  Vital signs are stable.  Neurological exam unchanged.  No new changes.  ANA panel is negative.  Lupus anticoagulant is negative.  Anticardiolipin antibodies and homocystine levels are normal OBJECTIVE Temp:  [97.4 F (36.3 C)-100.3 F (37.9 C)] 99.2 F (37.3 C) (04/27 0736) Pulse Rate:  [79-89] 79 (04/27 0736) Cardiac Rhythm: Heart block (04/26 2000) Resp:  [17-18] 18 (04/27 0736) BP: (123-163)/(81-100) 160/98 (04/27 0736) SpO2:  [100 %] 100 % (04/27 0736)  No results for input(s): GLUCAP in the last 168 hours. Recent Labs  Lab 10/04/20 0248 10/05/20 0441 10/06/20 0324 10/07/20 0229 10/09/20 1424  NA 139 141 138 136 136  K 3.1* 3.6 3.5 3.1* 3.5  CL 110 110 107 105 103  CO2 21* 24 20* 22 24  GLUCOSE 92 100* 113* 99 126*  BUN 11 8 11 11 10   CREATININE 1.00 0.98 0.90 1.01 0.98  CALCIUM 7.4* 7.6* 7.8* 7.5* 7.6*  MG  --   --   --   --  1.8  PHOS  --   --   --   --  3.2   No results for input(s): AST, ALT, ALKPHOS, BILITOT, PROT, ALBUMIN in the last 168 hours. Recent Labs  Lab 10/04/20 0248 10/05/20 0441 10/06/20 0324 10/07/20 0229  WBC 7.4 6.5 10.4 13.8*  HGB 9.4* 9.6* 10.1* 9.5*  HCT 28.6* 28.6* 30.1* 28.7*  MCV 88.8 86.9 87.2 87.2  PLT 272 257 275 244   No results for input(s): CKTOTAL, CKMB, CKMBINDEX, TROPONINI in the last 168 hours. No results for input(s): LABPROT, INR in the last 72 hours. No results for input(s): COLORURINE, LABSPEC, PHURINE, GLUCOSEU, HGBUR, BILIRUBINUR, KETONESUR, PROTEINUR, UROBILINOGEN, NITRITE, LEUKOCYTESUR in the last 72 hours.  Invalid input(s): APPERANCEUR     Component Value Date/Time   CHOL 297 (H) 10/02/2020 0607   TRIG 62 10/02/2020 0607   HDL 73 10/02/2020 0607   CHOLHDL 4.1 10/02/2020 0607   VLDL 12 10/02/2020 0607   LDLCALC 212 (H) 10/02/2020 0607   Lab Results  Component Value Date   HGBA1C 5.3 10/02/2020      Component Value Date/Time    LABOPIA NONE DETECTED 10/05/2020 1321   COCAINSCRNUR NONE DETECTED 10/05/2020 1321   LABBENZ NONE DETECTED 10/05/2020 1321   AMPHETMU NONE DETECTED 10/05/2020 1321   THCU NONE DETECTED 10/05/2020 1321   LABBARB NONE DETECTED 10/05/2020 1321    No results for input(s): ETH in the last 168 hours.  I have personally reviewed the radiological images below and agree with the radiology interpretations.  MR BRAIN WO CONTRAST  Result Date: 10/02/2020 CLINICAL DATA:  Stroke follow-up.  Status post tPA. EXAM: MRI HEAD WITHOUT CONTRAST TECHNIQUE: Multiplanar, multiecho pulse sequences of the brain and surrounding structures were obtained without intravenous contrast. COMPARISON:  October 01, 2020 CT code stroke. FINDINGS: Brain: Acute infarcts in the anterior and posterior right frontal lobe involving cortex and subcortical white matter. Associated edema with sulcal effacement. No substantial midline shift. Mild curvilinear susceptibility artifact the regions of infarct likely represent petechial hemorrhage. No hydrocephalus. No mass lesion. 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 No extra-axial fluid collection. Basal cisterns are patent. Vascular: Focal susceptibility artifact within a right M2 branch in the region of the more posterior frontal lobe infarct, compatible with thrombus and known occlusion better characterized on recent CTA. Major arterial flow voids are maintained skull base. Skull and upper cervical spine: No focal  marrow replacing lesion. Sinuses/Orbits: Clear sinuses.  Unremarkable orbits. Other: Large right mastoid effusion. IMPRESSION: 1. Acute infarcts in the anterior and posterior right frontal lobe. Associated edema with sulcal effacement. Mild curvilinear susceptibility artifact the regions of infarct likely represent petechial hemorrhage. No mass occupying acute hemorrhage. 2. Focal susceptibility artifact within a right M2 MCA branch in the region of the more posterior  frontal lobe infarct, compatible with thrombus and known occlusion better characterized on recent CTA. 3. 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 4. Large right mastoid effusion. Electronically Signed   By: Feliberto Harts MD   On: 10/02/2020 16:38   DG CHEST PORT 1 VIEW  Result Date: 10/06/2020 CLINICAL DATA:  Fevers. EXAM: PORTABLE CHEST 1 VIEW COMPARISON:  10/04/2020 FINDINGS: Stable cardiac enlargement. No pleural effusion or edema. No airspace densities identified. Visualized osseous structures are notable for thoracic scoliosis. IMPRESSION: 1. No acute cardiopulmonary abnormalities. 2. Cardiac enlargement. Electronically Signed   By: Signa Kell M.D.   On: 10/06/2020 07:53   DG CHEST PORT 1 VIEW  Result Date: 10/04/2020 CLINICAL DATA:  Fever, stroke, left-sided weakness EXAM: PORTABLE CHEST 1 VIEW COMPARISON:  None. FINDINGS: No consolidation, features of edema, pneumothorax, or effusion. Pulmonary vascularity is normally distributed. The cardiomediastinal contours are unremarkable. No acute osseous or soft tissue abnormality. Telemetry leads overlie the chest. IMPRESSION: No acute cardiopulmonary abnormality. Electronically Signed   By: Kreg Shropshire M.D.   On: 10/04/2020 06:21   DG Abd Portable 1V  Result Date: 10/06/2020 CLINICAL DATA:  Ileus. EXAM: PORTABLE ABDOMEN - 1 VIEW COMPARISON:  None. FINDINGS: Air-filled loops of large and small bowel. The colon appears to be normal in caliber. Small bowel loops are mildly dilated. No free air, portal venous gas, or pneumatosis. There is a stool ball in the rectum. No other abnormalities. IMPRESSION: 1. Air-filled loops of large and small bowel. The small bowel appears to be mildly dilated, out of proportion of the colon. While ileus is possible, findings are concerning for early small bowel obstruction. 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  Stroke. EXAM: CT ANGIOGRAPHY CHEST WITH CONTRAST TECHNIQUE: Multidetector CT imaging of the chest was performed using the standard protocol during bolus administration of intravenous contrast. Multiplanar CT image reconstructions and MIPs were obtained to evaluate the vascular anatomy. CONTRAST:  OMNIPAQUE IOHEXOL 350 MG/ML SOLN COMPARISON:  None. FINDINGS: Cardiovascular: Thoracic aorta is normal in caliber. Minimal aortic atherosclerosis. No dissection. Mild cardiomegaly. No pericardial effusion. No evidence of pulmonary vascular malformation. Normal normal pulmonary arterial or venous connection. Mediastinum/Nodes: No mediastinal or hilar adenopathy. Decompressed esophagus. Subcentimeter left thyroid nodule needs no further follow-up given size. Lungs/Pleura: No focal airspace disease. No pleural fluid. No pulmonary edema. No pulmonary nodule or mass Upper Abdomen: No acute or unexpected findings. Musculoskeletal: Dextroscoliosis of the thoracic spine with mild degenerative change. There are no acute or suspicious osseous abnormalities. Review of the MIP images confirms the above findings. IMPRESSION: 1. No pulmonary vascular malformation or acute intrathoracic abnormality. 2. Mild cardiomegaly. Aortic Atherosclerosis (ICD10-I70.0). Electronically Signed   By: Narda Rutherford M.D.   On: 10/09/2020 22:39   VAS [place] TRANSCRANIAL DOPPLER W BUBBLES  Result Date: 10/09/2020  Transcranial Doppler with Bubble Patient Name:  HASSAAN CRITE  Date of Exam:  10/09/2020 Medical Rec #: [id number]          Accession #:    [id number] Date of Birth: 10/23/1975           Patient Gender: M Patient Age:   44Y Exam Location:  [facility] Procedure:      VAS [place] TRANSCRANIAL Demetrios Isaacs Referring Phys: [id number] Taina Landry S Stephene Alegria --------------------------------------------------------------------------------   Indications: Stroke. Comparison Study: no prior Performing Technologist: Blanch Media RVS  Examination Guidelines: A complete evaluation includes B-mode imaging, spectral Doppler, color Doppler, and power Doppler as needed of all accessible portions of each vessel. Bilateral testing is considered an integral part of a complete examination. Limited examinations for reoccurring indications may be performed as noted.  Summary:  A vascular evaluation was performed. The right middle cerebral artery was studied. An IV was inserted into the patient's right forearm . Verbal informed consent was obtained.  HITS heard at rest & valsalva: spencer grade 2 *See table(s) above for TCD measurements and observations.    Preliminary    ECHOCARDIOGRAM COMPLETE  Result Date: 10/02/2020    ECHOCARDIOGRAM REPORT   Patient Name:   RONDARIUS TEITEL Date of Exam: 10/02/2020 Medical Rec #:  [id number]         Height:       74.0 in Accession #:    [id number]        Weight:       131.4 lb Date of Birth:  02/26/1976          BSA:          1.818 m Patient Age:    44 years          BP:           168/104 mmHg Patient Gender: M                 HR:           90 bpm. Exam Location:  Inpatient Procedure: 2D Echo Indications:    Stroke I63.9  History:        Patient has no prior history of Echocardiogram examinations.  Sonographer:    Thurman Coyer RDCS (AE) Referring Phys: [id number] Marvel Plan IMPRESSIONS  1. Left ventricular ejection fraction, by estimation, is 40 to 45%. The left ventricle has mildly decreased function. The left ventricle demonstrates global hypokinesis. There is mild concentric left ventricular hypertrophy. Left ventricular diastolic parameters are consistent with Grade I diastolic dysfunction (impaired relaxation). Elevated left atrial pressure.  2. Right ventricular systolic function is normal. The right ventricular size is normal.  3. The pericardial effusion is circumferential. There is no evidence of cardiac tamponade.   4. The mitral valve is normal in structure. Trivial mitral valve regurgitation. No evidence of mitral stenosis.  5. The aortic valve is normal in structure. Aortic valve regurgitation is trivial. No aortic stenosis is present.  6. Aortic dilatation noted. There is mild dilatation of the aortic root, measuring 44 mm. There is mild dilatation of the ascending aorta, measuring 42 mm.  7. The inferior vena cava is normal in size with greater than 50% respiratory variability, suggesting right atrial pressure of 3 mmHg. FINDINGS  Left Ventricle: Left ventricular ejection fraction, by estimation, is 40 to 45%. The left ventricle has mildly decreased function. The left ventricle demonstrates global hypokinesis. The left ventricular internal cavity size was normal in size. There is  mild concentric left ventricular hypertrophy. Left ventricular diastolic parameters are consistent with Grade I  diastolic dysfunction (impaired relaxation). Elevated left atrial pressure. Right Ventricle: The right ventricular size is normal. No increase in right ventricular wall thickness. Right ventricular systolic function is normal. Left Atrium: Left atrial size was normal in size. Right Atrium: Right atrial size was normal in size. Pericardium: Trivial pericardial effusion is present. The pericardial effusion is circumferential. There is no evidence of cardiac tamponade. Mitral Valve: The mitral valve is normal in structure. Trivial mitral valve regurgitation. No evidence of mitral valve stenosis. Tricuspid Valve: The tricuspid valve is normal in structure. Tricuspid valve regurgitation is not demonstrated. No evidence of tricuspid stenosis. Aortic Valve: The aortic valve is normal in structure. Aortic valve regurgitation is trivial. No aortic stenosis is present. Aortic valve mean gradient measures 3.0 mmHg. Aortic valve peak gradient measures 5.1 mmHg. Aortic valve area, by VTI measures 3.31 cm. Pulmonic Valve: The pulmonic valve was  normal in structure. Pulmonic valve regurgitation is not visualized. No evidence of pulmonic stenosis. Aorta: The aortic root is normal in size and structure and aortic dilatation noted. There is mild dilatation of the aortic root, measuring 44 mm. There is mild dilatation of the ascending aorta, measuring 42 mm. Venous: The inferior vena cava is normal in size with greater than 50% respiratory variability, suggesting right atrial pressure of 3 mmHg. IAS/Shunts: No atrial level shunt detected by color flow Doppler.  LEFT VENTRICLE PLAX 2D LVIDd:         4.60 cm  Diastology LVIDs:         3.40 cm  LV e' medial:   3.81 cm/s LV PW:         1.40 cm  LV E/e' medial: 21.4 LV IVS:        1.40 cm LVOT diam:     2.40 cm LV SV:         64 LV SV Index:   35 LVOT Area:     4.52 cm  RIGHT VENTRICLE TAPSE (M-mode): 2.2 cm LEFT ATRIUM           Index       RIGHT ATRIUM           Index LA diam:      2.00 cm 1.10 cm/m  RA Area:     16.00 cm LA Vol (A2C): 42.7 ml 23.49 ml/m RA Volume:   42.30 ml  23.27 ml/m LA Vol (A4C): 45.1 ml 24.81 ml/m  AORTIC VALVE AV Area (Vmax):    3.97 cm AV Area (Vmean):   3.91 cm AV Area (VTI):     3.31 cm AV Vmax:           113.00 cm/s AV Vmean:          82.900 cm/s AV VTI:            0.193 m AV Peak Grad:      5.1 mmHg AV Mean Grad:      3.0 mmHg LVOT Vmax:         99.20 cm/s LVOT Vmean:        71.700 cm/s LVOT VTI:          0.141 m LVOT/AV VTI ratio: 0.73  AORTA Ao Root diam: 4.40 cm Ao Asc diam:  3.90 cm MITRAL VALVE MV Area (PHT): 4.41 cm    SHUNTS MV Decel Time: 172 msec    Systemic VTI:  0.14 m MV E velocity: 81.50 cm/s  Systemic Diam: 2.40 cm MV A velocity: 85.30 cm/s MV E/A ratio:  0.96  Thurmon Fair MD Electronically signed by Thurmon Fair MD Signature Date/Time: 10/02/2020/3:08:26 PM    Final    CT HEAD CODE STROKE WO CONTRAST  Result Date: 10/01/2020 CLINICAL DATA:  Code stroke. Acute neuro deficit. Rule out stroke. Left facial droop EXAM: CT HEAD WITHOUT CONTRAST TECHNIQUE:  Contiguous axial images were obtained from the base of the skull through the vertex without intravenous contrast. COMPARISON:  None. FINDINGS: Brain: Ventricle size and cerebral volume normal. Well-defined hypodensity left thalamus compatible with chronic infarction. Negative for acute infarct, hemorrhage, mass Vascular: Negative for hyperdense vessel Skull: Negative Sinuses/Orbits: Mild mucosal edema paranasal sinuses. Large right mastoid effusion and middle ear effusion. Left mastoid sinus clear. Other: None ASPECTS ([place] Stroke Program Early CT Score) - Ganglionic level infarction (caudate, lentiform nuclei, internal capsule, insula, M1-M3 cortex): 7 - Supraganglionic infarction (M4-M6 cortex): 3 Total score (0-10 with 10 being normal): 10 IMPRESSION: 1. No acute intracranial abnormality 2. ASPECTS is 10 3. Code stroke imaging results were communicated on 10/01/2020 at 6:19 pm to provider Roda Shutters via text page Electronically Signed   By: Marlan Palau M.D.   On: 10/01/2020 18:20   VAS [place] LOWER EXTREMITY VENOUS (DVT)  Result Date: 10/03/2020  Lower Venous DVT Study Indications: Stroke.  Comparison Study: no prior Performing Technologist: Blanch Media RVS  Examination Guidelines: A complete evaluation includes B-mode imaging, spectral Doppler, color Doppler, and power Doppler as needed of all accessible portions of each vessel. Bilateral testing is considered an integral part of a complete examination. Limited examinations for reoccurring indications may be performed as noted. The reflux portion of the exam is performed with the patient in reverse Trendelenburg.  +---------+---------------+---------+-----------+----------+--------------+ RIGHT    CompressibilityPhasicitySpontaneityPropertiesThrombus Aging +---------+---------------+---------+-----------+----------+--------------+ CFV      Full           Yes      Yes                                  +---------+---------------+---------+-----------+----------+--------------+ SFJ      Full                                                        +---------+---------------+---------+-----------+----------+--------------+ FV Prox  Full                                                        +---------+---------------+---------+-----------+----------+--------------+ FV Mid   Full                                                        +---------+---------------+---------+-----------+----------+--------------+ FV DistalFull                                                        +---------+---------------+---------+-----------+----------+--------------+ PFV  Full                                                        +---------+---------------+---------+-----------+----------+--------------+ POP      Full           Yes      Yes                                 +---------+---------------+---------+-----------+----------+--------------+ PTV      Full                                                        +---------+---------------+---------+-----------+----------+--------------+ PERO     Full                                                        +---------+---------------+---------+-----------+----------+--------------+   +---------+---------------+---------+-----------+----------+--------------+ LEFT     CompressibilityPhasicitySpontaneityPropertiesThrombus Aging +---------+---------------+---------+-----------+----------+--------------+ CFV      Full           Yes      Yes                                 +---------+---------------+---------+-----------+----------+--------------+ SFJ      Full                                                        +---------+---------------+---------+-----------+----------+--------------+ FV Prox  Full                                                         +---------+---------------+---------+-----------+----------+--------------+ FV Mid   Full                                                        +---------+---------------+---------+-----------+----------+--------------+ FV DistalFull                                                        +---------+---------------+---------+-----------+----------+--------------+ PFV      Full                                                        +---------+---------------+---------+-----------+----------+--------------+  POP      Full           Yes      Yes                                 +---------+---------------+---------+-----------+----------+--------------+ PTV      Full                                                        +---------+---------------+---------+-----------+----------+--------------+ PERO     Full                                                        +---------+---------------+---------+-----------+----------+--------------+     Summary: BILATERAL: - No evidence of deep vein thrombosis seen in the lower extremities, bilaterally. - No evidence of superficial venous thrombosis in the lower extremities, bilaterally. -No evidence of popliteal cyst, bilaterally.   *See table(s) above for measurements and observations. Electronically signed by Heath Larkhomas Hawken on 10/03/2020 at 4:38:55 PM.    Final    CT ANGIO HEAD NECK W WO CM (CODE STROKE)  Addendum Date: 10/01/2020   ADDENDUM REPORT: 10/01/2020 20:03 ADDENDUM: After further discussion with Dr. Roda ShuttersXu, there is a right M2 branch occlusion supplying the right parietal lobe. Electronically Signed   By: Marlan Palauharles  Clark M.D.   On: 10/01/2020 20:03   Result Date: 10/01/2020 CLINICAL DATA:  Left facial droop EXAM: CT HEAD WITHOUT CONTRAST CT ANGIOGRAPHY HEAD AND NECK TECHNIQUE: Multidetector CT imaging of the head and neck was performed using the standard protocol during bolus administration of intravenous contrast. Multiplanar CT  image reconstructions and MIPs were obtained to evaluate the vascular anatomy. Carotid stenosis measurements (when applicable) are obtained utilizing NASCET criteria, using the distal internal carotid diameter as the denominator. CONTRAST:  75mL OMNIPAQUE IOHEXOL 350 MG/ML SOLN COMPARISON:  CT head 10/01/2020 FINDINGS: CTA NECK FINDINGS Aortic arch: Standard branching. Imaged portion shows no evidence of aneurysm or dissection. No significant stenosis of the major arch vessel origins. Right carotid system: Normal right carotid. Negative for atherosclerotic disease or stenosis. Left carotid system: Normal left carotid. Negative for atherosclerotic disease or stenosis. Vertebral arteries: Both vertebral arteries widely patent without stenosis. Skeleton: Cervicothoracic scoliosis. No acute skeletal abnormality. Multiple caries. Periapical lucency around right lower molar. Other neck: No mass or adenopathy. 12 mm left thyroid nodule. No further imaging necessary. Upper chest: Lung apices clear bilaterally. Review of the MIP images confirms the above findings CTA HEAD FINDINGS Anterior circulation: Anterior and middle cerebral arteries normal bilaterally. No stenosis or large vessel occlusion. Posterior circulation: Normal posterior circulation. No stenosis or large vessel occlusion. Venous sinuses: Normal venous enhancement Anatomic variants: None Delayed phase: Not performed Review of the MIP images confirms the above findings IMPRESSION: Normal CT angiogram head and neck. No significant atherosclerotic disease. No stenosis or intracranial large vessel occlusion. Electronically Signed: By: Marlan Palauharles  Clark M.D. On: 10/01/2020 19:16    PHYSICAL EXAM  Temp:  [97.4 F (36.3 C)-100.3 F (37.9 C)] 99.2 F (37.3 C) (04/27 0736) Pulse Rate:  [79-89] 79 (04/27 0736) Resp:  [17-18] 18 (04/27  3419) BP: (123-163)/(81-100) 160/98 (04/27 0736) SpO2:  [100 %] 100 % (04/27 0736)  General -frail middle-age African-American  male not in distress.  Ophthalmologic - fundi not visualized due to noncooperation.  Cardiovascular - Regular rhythm and rate.  Neuro - awake alert and oriented  to age, place, time and people. No aphasia, mild dysarthria with hypophonia but fluent language, following all simple commands. Able to name and repeat.  Mild dysarthria.  no gaze palsy now, visual field full no hemianopia, PERRL.  Left facial droop. Tongue protrusion to the left.  Right upper and lower extremity 4/5, left upper extremity deltoid 0/5 but bicep 2/5  , tricep and finger movement 0/5, left lower extremity 2-3/5 proximal and distal PF/DF 0/5.  Sensation decreased on the left, still has LLE sensory neglect.  Right finger-to-nose intact.  Gait not tested.    ASSESSMENT/PLAN Mr. PARKER WHERLEY is a 45 y.o. male with PMH of heart murmur not on medications presented to ER for acute onset left arm and leg weakness, left visual and sensory neglect, right gaze preference, left facial droop nausea/vomiting. tPA given.  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  Discussed with patient regarding risk and benefit of thrombectomy, patient declined thrombectomy.      Stroke: Right MCA infarct, embolic pattern, source cryptogenic CT head early ischemic changes right MCA but no hypodensity  CTA head neck was done showed right M2/M3 occlusion.   MRI right moderate MCA cortical infarcts   2D Echo EF 40 to 45%  LE venous Doppler no DVT  TEE no clot but positive for PFO   TCD bubble study positive for small (Spencer grade 2 ) right-to-left shunt  Lower extremity venous Doppler negative for DVT  LDL 212  HgbA1c 5.3  UDS negative  Lovenox for VTE prophylaxis  No antithrombotic prior to admission, now on aspirin 81mg  daily and clopidogrel 75 mg daily DAPT for 3 months and then aspirin alone given large vessel occlusion intracranially.  Patient counseled to be compliant with his antithrombotic  medications  Ongoing aggressive stroke risk factor management  Therapy recommendations: SNF   Disposition: SNF  Cardiomyopathy  EF 40 to 45% TEE negative for intracardiac clot but positive for PFO  hypertensive urgency . Needed labetalol and Cleviprex IV before IV tPA for BP control . Stable on the high end . Off Cleviprex . On amlodipine 10   Long term BP goal normotensive  Hyperlipidemia  Home meds: None  LDL 212, goal < 70  Now on Lipitor 80  Continue statin at discharge  Dysphagia  Mild dysarthria  On dysphagia 2 and thin liquid  intermittent cough improving  Other Stroke Risk Factors  Hx of heart murmur  Klebsiella UTI  Leukocytosis WBC 10.9-> 8.1->7.4->6.5->10.4->13.8  Febrile 38.5->38.1  Discussed with [facility] pharmacist. She discussed with ID pharmacy to decide upon treatment: Keflex 500mg  po q 12 hours x 5-7 days    Other Active Problems  Hypokalemia K 3.1 ->3.6->3.5->3.1->pending  Hospital day # 9 Continue present management with aspirin and Plavix for 3 months and aggressive risk factor modification.  We will consider outpatient PFO closure evaluation by cardiologist after his recovery from rehab.  Medically stable to transfer to rehab when bed available.   , MD    To contact Stroke Continuity provider, please refer to . After hours, contact General Neurology

## 2020-10-10 NOTE — Progress Notes (Signed)
Occupational Therapy Treatment Patient Details Name: Harry [name] MRN: [id number] DOB: 1975/08/13 Today's Date: 10/10/2020    History of present illness 45 yo male presenting to ED with left sided weakness. CT showing early ischemic changes around right MCA but no hypodensity. tPA given on 4/18 @1823 . 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 Awaiting MRI. PMH including heart murmur not on medications.   OT comments  Pt making steady progress towards OT goals this session. Session limited by pt reports of dizziness and nausea. Session focus standing/ sitting balance and LUE ROM. Pt currently requires MIN A +1 with increased time to complete bed mobility and MIN - MOD A +2 for sit<>stand to stedy. Once pt in standing, pt reports nausea and dizziness ( see vitals below). Attempted to work on ( shoulder horizontal ADD/ABD) with LUE on stedy however pt needing to return to supine. Pt agreeable to LUE therex as indicated below from upright position in bed. Pt with difficulty completing forward shoulder flexion with RUE positioned on top of LUE with LUE sliding forward on tray table, pt required MAX A to shift trunk anteriorly to achieve full ROM. Pt noted to become lethargic during therex therefore deferred further therex d/t level of arousal. Pt would continue to benefit from skilled occupational therapy while admitted and after d/c to address the below listed limitations in order to improve overall functional mobility and facilitate independence with BADL participation. DC plan remains appropriate, will follow acutely per POC.    BP from sitting on stedy- 133/97 attempted BP in standing however pt unable to maintain standing position therfore BP likely inaccurate 107/83 BP from supine 128/91   Follow Up Recommendations  SNF    Equipment Recommendations  Other (comment) (defer to next venue of care)    Recommendations for Other Services      Precautions /  Restrictions Precautions Precautions: Fall;Other (comment) Precaution Comments: LUE hemiparesis, subluxing LUE Restrictions Weight Bearing Restrictions: No       Mobility Bed Mobility Overal bed mobility: Needs Assistance Bed Mobility: Supine to Sit     Supine to sit: Min assist;HOB elevated     General bed mobility comments: MIN A +1 with pt able to transition BLEs to EOB with increased time and effort, light MIN A to elevate trunk into sitting and scoot hips to EOB    Transfers Overall transfer level: Needs assistance Equipment used: Ambulation equipment used Transfers: Sit to/from Stand Sit to Stand: Mod assist;Min assist;+2 physical assistance         General transfer comment: pt initially MOD A +2 to rise into standing from EOB but progressing to [facility]  +2 as  session progressed, further mobility limited by reports of dizziness and nausea    Balance Overall balance assessment: Needs assistance Sitting-balance support: Feet supported;Single extremity supported Sitting balance-Leahy Scale: Fair Sitting balance - Comments: close min guard for static sitting EOB   Standing balance support: Single extremity supported;During functional activity Standing balance-Leahy Scale: Fair Standing balance comment: pt able to stand statically with one UE support briefly with close min guard                           ADL either performed or assessed with clinical judgement   ADL Overall ADL's : Needs assistance/impaired     Grooming: Wash/dry face;Sitting;Supervision/safety;Set up Grooming Details (indicate cue type and reason): wiping face with RUE  Toilet Transfer: Moderate assistance;+2 for physical assistance Toilet Transfer Details (indicate cue type and reason): sit<>stand to stedy; MOD A +2 to rise from EOB         Functional mobility during ADLs: Moderate assistance;+2 for physical assistance (sit<>stand to stedy only) General ADL  Comments: session focus on functional sit<>stands with stedy, however pt limited by reports of dizziness however BP WFL ( see vitals) additionally worked on [employer]      Cognition Arousal/Alertness: Awake/alert Behavior During Therapy: Flat affect Overall Cognitive Status: Impaired/Different from baseline Area of Impairment: Following commands;Safety/judgement;Awareness;Attention;Problem solving                   Current Attention Level: Sustained   Following Commands: Follows one step commands with increased time;Follows multi-step commands inconsistently Safety/Judgement: Decreased awareness of safety;Decreased awareness of deficits Awareness: Emergent Problem Solving: Slow processing;Difficulty sequencing;Requires verbal cues General Comments: pt seems to be more self limiting this session, pt unable to verbalize exact ailments but perseverates on "dizzy and nausea" however BP WFL ( RN reports he has been saying this frequently)        Exercises General Exercises - Upper Extremity Shoulder Flexion: Left;Seated;Self ROM;10 reps Elbow Flexion: Left;10 reps;Seated;AAROM Elbow Extension: AROM;AAROM;Left;10 reps;Seated Other Exercises Other Exercises: horizontal ABD/ADD, shoulder flexion/extension, shoulder circumduction with RUE over LUE positioned on bed side table with use of wash cloth underneath hands to decrease friction   Shoulder Instructions       General Comments BP from sitting on stedy- 133/97, attempted BP in standing however pt unable to maintain standing position therfore BP likely inaccurate 107/83, BP from supine 128/91    Pertinent Vitals/ Pain       Pain Assessment: Faces Faces Pain Scale: Hurts a little bit Pain Location: general discomfort from feeling nauseous and dizzy Pain Descriptors / Indicators: Discomfort;Grimacing Pain Intervention(s): Limited activity within patient's tolerance;Monitored during  session;Repositioned  Home Living                                          Prior Functioning/Environment              Frequency  Min 2X/week        Progress Toward Goals  OT Goals(current goals can now be found in the care plan section)  Progress towards OT goals: Progressing toward goals  Acute Rehab OT Goals Patient Stated Goal: to lay back down OT Goal Formulation: With patient Time For Goal Achievement: 10/16/20 Potential to Achieve Goals: Fair  Plan Discharge plan remains appropriate;Frequency remains appropriate    Co-evaluation                 AM-PAC OT "6 Clicks" Daily Activity     Outcome Measure   Help from another person eating meals?: A Little Help from another person taking care of personal grooming?: A Little Help from another person toileting, which includes using toliet, bedpan, or urinal?: A Lot Help from another person bathing (including washing, rinsing, drying)?: A Lot Help from another person to put on and taking off regular upper body clothing?: A Lot Help from another person to put on and taking off regular lower body clothing?: A Lot 6 Click Score: 14    End of Session Equipment Utilized During Treatment: Gait belt;Other (comment) (  stedy)  OT Visit Diagnosis: Unsteadiness on feet (R26.81);Other abnormalities of gait and mobility (R26.89);Muscle weakness (generalized) (M62.81);Hemiplegia and hemiparesis Hemiplegia - Right/Left: Left Hemiplegia - dominant/non-dominant: Non-Dominant Hemiplegia - caused by: Cerebral infarction   Activity Tolerance Patient tolerated treatment well   Patient Left in bed;with call bell/phone within reach;with bed alarm set   Nurse Communication Mobility status        Time: 2297-9892 OT Time Calculation (min): 35 min  Charges: OT General Charges $OT Visit: 1 Visit OT Treatments $Therapeutic Activity: 23-37 mins  Lenor Derrick., COTA/L Acute Rehabilitation  Services [phone]    Barron Schmid 10/10/2020, 3:00 PM

## 2020-10-10 NOTE — Plan of Care (Signed)
  Problem: Education: Goal: Knowledge of General Education information will improve Description: Including pain rating scale, medication(s)/side effects and non-pharmacologic comfort measures Outcome: Progressing   Problem: Health Behavior/Discharge Planning: Goal: Ability to manage health-related needs will improve Outcome: Progressing   Problem: Clinical Measurements: Goal: Diagnostic test results will improve Outcome: Progressing Goal: Cardiovascular complication will be avoided Outcome: Progressing   Problem: Activity: Goal: Risk for activity intolerance will decrease Outcome: Progressing   Problem: Nutrition: Goal: Adequate nutrition will be maintained Outcome: Progressing   

## 2020-10-11 NOTE — Progress Notes (Signed)
Physical Therapy Treatment Patient Details Name: Harry [name] MRN: [id number] DOB: 11-14-1975 Today's Date: 10/11/2020    History of Present Illness 45 yo male presenting to ED with left sided weakness. CT showing early ischemic changes around right MCA but no hypodensity. tPA given on 4/18 @1823 . 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 Awaiting MRI. PMH including heart murmur not on medications.    PT Comments    Pt with improved mood today, eager to work with therapy. Focus of session on progressing gait and problem solving. Pt continues to be limited by L sided weakness and in terms of gait knee extensors and hip abductors. Pt able to come to EoB with min guard. Utilized 3 Musketeer transfer and ambulation. Pt requires maxAx2 for achieving upright and with progression of ambulation to 10 feet x2 with seated rest break due to fatigue. Pt educated on use of R LE to assist in bringing L LE back into bed and pt able to achieve with mod outside assist. Pt reports he is impressed with how much progress he has made this week. D/c plans remain appropriate however will continue to work toward level of independence for d/c back to brother's home.     Follow Up Recommendations  SNF     Equipment Recommendations  3in1 (PT);Wheelchair cushion (measurements PT);Wheelchair (measurements PT)       Precautions / Restrictions Precautions Precautions: Fall;Other (comment) Precaution Comments: LUE hemiparesis, subluxing LUE Restrictions Weight Bearing Restrictions: No    Mobility  Bed Mobility Overal bed mobility: Needs Assistance Bed Mobility: Supine to Sit   Sidelying to sit: Min guard Supine to sit: Mod assist     General bed mobility comments: min guard and increased time and effort to problem solve and achieve coming to seated EoB. for return to bed taught hooking of effected LE with uneffected LE to assist in bringing LE back into bed pt requiring modA for  mechanics    Transfers Overall transfer level: Needs assistance Equipment used: Rolling walker (2 wheeled);Ambulation equipment used Transfers: Sit to/from Stand Sit to Stand: +2 physical assistance;Min assist         General transfer comment: minAx2 for 3 musketeers transfer from bed to standing.  Ambulation/Gait Ambulation/Gait assistance: Max assist;+2 physical assistance Gait Distance (Feet): 10 Feet (seated rest break and then 10 feet back to bed) Assistive device: 2 person hand held assist (3 Musketeers ambulation) Gait Pattern/deviations: Step-through pattern;Narrow base of support;Decreased weight shift to left;Decreased stance time - left;Decreased step length - right Gait velocity: decr Gait velocity interpretation: <1.31 ft/sec, indicative of household ambulator General Gait Details: with 3 musketeer assist, and maximal multimodal cuing able to achieve ambulation to chair placed at door. Pt cued for watching LE placement increased effort to widen L LE stance with hip Abduction, PT assist to block L knee with R LE stance phase      Modified Rankin (Stroke Patients Only) Modified Rankin (Stroke Patients Only) Pre-Morbid Rankin Score: No symptoms Modified Rankin: Moderately severe disability     Balance Overall balance assessment: Needs assistance Sitting-balance support: Feet supported;Single extremity supported Sitting balance-Leahy Scale: Fair Sitting balance - Comments: close guard for safety statically, requires light assist dynamically   Standing balance support: Bilateral upper extremity supported;During functional activity Standing balance-Leahy Scale: Poor Standing balance comment: able to progress to min guard for appox 1 min with use of Stedy and diversion of looking out the window  Cognition Arousal/Alertness: Awake/alert Behavior During Therapy: Flat affect Overall Cognitive Status: Impaired/Different from  baseline Area of Impairment: Following commands;Safety/judgement;Awareness;Attention;Problem solving                   Current Attention Level: Sustained   Following Commands: Follows one step commands with increased time;Follows multi-step commands inconsistently Safety/Judgement: Decreased awareness of safety Awareness: Emergent Problem Solving: Slow processing;Difficulty sequencing;Requires verbal cues General Comments: pt with improved mood today, and more talkative, able to participate in some problem solving mobility for his brothers apartment         General Comments General comments (skin integrity, edema, etc.): VSS      Pertinent Vitals/Pain Pain Assessment: No/denies pain           PT Goals (current goals can now be found in the care plan section) Acute Rehab PT Goals Patient Stated Goal: none stated today PT Goal Formulation: With patient/family Time For Goal Achievement: 10/16/20 Potential to Achieve Goals: Good    Frequency    Min 5X/week      PT Plan Current plan remains appropriate       AM-PAC PT "6 Clicks" Mobility   Outcome Measure  Help needed turning from your back to your side while in a flat bed without using bedrails?: A Lot Help needed moving from lying on your back to sitting on the side of a flat bed without using bedrails?: A Lot Help needed moving to and from a bed to a chair (including a wheelchair)?: A Lot Help needed standing up from a chair using your arms (e.g., wheelchair or bedside chair)?: A Lot Help needed to walk in hospital room?: A Lot Help needed climbing 3-5 steps with a railing? : Total 6 Click Score: 11    End of Session Equipment Utilized During Treatment: Gait belt Activity Tolerance: Patient limited by fatigue Patient left: in bed;with call bell/phone within reach;with bed alarm set;with nursing/sitter in room Nurse Communication: Mobility status PT Visit Diagnosis: Unsteadiness on feet (R26.81);Other  abnormalities of gait and mobility (R26.89);Difficulty in walking, not elsewhere classified (R26.2);Hemiplegia and hemiparesis Hemiplegia - Right/Left: Left Hemiplegia - dominant/non-dominant: Non-dominant Hemiplegia - caused by: Cerebral infarction     Time: 4132-4401 PT Time Calculation (min) (ACUTE ONLY): 39 min  Charges:  $Gait Training: 23-37 mins $Therapeutic Activity: 8-22 mins                     Harry [name] B. Beverely Risen PT, DPT Acute Rehabilitation Services Pager [phone] Office [phone]    Elon Alas Fleet 10/11/2020, 4:18 PM

## 2020-10-11 NOTE — Progress Notes (Signed)
Occupational Therapy Treatment Patient Details Name: Harry [name] MRN: [id number] DOB: May 27, 1976 Today's Date: 10/11/2020    History of present illness 45 yo male presenting to ED with left sided weakness. CT showing early ischemic changes around right MCA but no hypodensity. tPA given on 4/18 @1823 . 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 Awaiting MRI. PMH including heart murmur not on medications.   OT comments  Pt making steady progress towards OT goals this session. Pt asleep upon COTA arrival but easily able to arouse. Pt continues to present with impaired balance, L hemiparesis and cognitive deficits. Pt more resistant to mobilize OOB this session and distracted by extraneous stimuli need MAX A +1 to transition into sitting EOB. Pt completed multiple sit<>stands to stedy with MINA  +2 from EOB and min guard assist +2 from seat of stedy. Pt able to complete standing grooming tasks at sink using LUE as stabilizer to open items, MIN A +1 for dynamic standing balance. Additionally worked on passing ADL items along counter top with LUE from L>R with COTA assisting by placing LUE on top of items. Pt able to complete wall climbs with R hand over L hand with pt limited to ~ 90* shoulder flexion, additionally worked on horizontal shoulder ADD/ABD with pt needing MIN A for lateral weight shifting while standing in stedy. Pt would continue to benefit from skilled occupational therapy while admitted and after d/c to address the below listed limitations in order to improve overall functional mobility and facilitate independence with BADL participation. DC plan remains appropriate, will follow acutely per POC.    Follow Up Recommendations  SNF    Equipment Recommendations  Other (comment) (defer to next venue of care)    Recommendations for Other Services      Precautions / Restrictions Precautions Precautions: Fall;Other (comment) Precaution Comments: LUE hemiparesis,  subluxing LUE Restrictions Weight Bearing Restrictions: No       Mobility Bed Mobility   Bed Mobility: Supine to Sit;Sit to Supine     Supine to sit: Max assist;HOB elevated Sit to supine: Mod assist;HOB elevated   General bed mobility comments: pt initially resistant to mobilizing OOB needing MAX A +1 to transition to EOB to pts R side, MOD A to return to supine needing most assist to elevate BLEs back to bed    Transfers Overall transfer level: Needs assistance Equipment used: Ambulation equipment used Transfers: Sit to/from Stand Sit to Stand: Min guard;+2 physical assistance;+2 safety/equipment;Min assist         General transfer comment: MIN A +2 to rise to stedy from EOB however pt progressing to min guad assist from seat of stedy, reapeated cues for LUE placement on bar of stedy prior to standing    Balance Overall balance assessment: Needs assistance Sitting-balance support: Feet supported;Single extremity supported Sitting balance-Leahy Scale: Fair Sitting balance - Comments: close guard for safety statically from EOB   Standing balance support: Single extremity supported;During functional activity Standing balance-Leahy Scale: Poor Standing balance comment: at least MIN A for dynamic standing balance in stedy when completing standing grooming tasks                           ADL either performed or assessed with clinical judgement   ADL Overall ADL's : Needs assistance/impaired     Grooming: Oral care;Standing;Minimal assistance;Cueing for compensatory techniques Grooming Details (indicate cue type and reason): cues for compensatory methods using LUE as  stabilizer while pt stands at sink in stedy         Upper Body Dressing : Minimal assistance;Sitting Upper Body Dressing Details (indicate cue type and reason): pt able to place LUE in sleeve as gown using RUE as assist     Toilet Transfer: Min guard;Minimal assistance;+2 for physical  assistance;Total assistance Toilet Transfer Details (indicate cue type and reason): sit<>stand to stedy, minguard +2 from seat of stedy, total A for transportation in stedy         Functional mobility during ADLs: Minimal assistance;Min guard;+2 for physical assistance;+2 for safety/equipment (sit<>stand in stedy) General ADL Comments: session focus on functional sit<>stands with stedy, standign grooming tasks using LUE as stabilizer and LUE therex     Vision       Perception     Praxis      Cognition Arousal/Alertness: Awake/alert;Lethargic (initially lethargic but arouses more as session progresses) Behavior During Therapy: Flat affect Overall Cognitive Status: Impaired/Different from baseline Area of Impairment: Attention;Memory;Following commands;Safety/judgement;Awareness;Problem solving                   Current Attention Level: Sustained Memory: Decreased short-term memory (needs reapeated cues for management of LUE) Following Commands: Follows one step commands with increased time;Follows multi-step commands inconsistently Safety/Judgement: Decreased awareness of safety;Decreased awareness of deficits Awareness: Intellectual Problem Solving: Slow processing;Difficulty sequencing;Requires verbal cues;Decreased initiation;Requires tactile cues General Comments: pt continues to flat and lethargic initially but overall appropriate, pt required increased encouragement to participate, very distracted by extraneous stimuli needs intermittent redirection to task, increased cues for body awareness as pt noted to leave LUE behind despite MAX cues        Exercises General Exercises - Upper Extremity Shoulder Horizontal ABduction: AAROM;Left;10 reps;Standing;Other (comment) (standing in stedy washing counter R<>L with R hand over L hand) Shoulder Horizontal ADduction: AAROM;Left;10 reps;Standing (standing in stedy washing counter R<>L with R hand over L hand) Other  Exercises Other Exercises: wall climbs with R hand over L hand standing in stedy; pt limited to ~ 90* shoulder flexion   Shoulder Instructions       General Comments      Pertinent Vitals/ Pain       Pain Assessment: No/denies pain  Home Living                                          Prior Functioning/Environment              Frequency  Min 2X/week        Progress Toward Goals  OT Goals(current goals can now be found in the care plan section)  Progress towards OT goals: Progressing toward goals  Acute Rehab OT Goals Patient Stated Goal: no goals stated Time For Goal Achievement: 10/16/20 Potential to Achieve Goals: Fair  Plan Discharge plan remains appropriate;Frequency remains appropriate    Co-evaluation                 AM-PAC OT "6 Clicks" Daily Activity     Outcome Measure   Help from another person eating meals?: A Little Help from another person taking care of personal grooming?: A Little Help from another person toileting, which includes using toliet, bedpan, or urinal?: A Lot Help from another person bathing (including washing, rinsing, drying)?: A Lot Help from another person to put on and taking off regular upper body clothing?: A Little  Help from another person to put on and taking off regular lower body clothing?: A Lot 6 Click Score: 15    End of Session Equipment Utilized During Treatment: Gait belt;Other (comment) (stedy)  OT Visit Diagnosis: Unsteadiness on feet (R26.81);Other abnormalities of gait and mobility (R26.89);Muscle weakness (generalized) (M62.81);Hemiplegia and hemiparesis Hemiplegia - Right/Left: Left Hemiplegia - dominant/non-dominant: Non-Dominant Hemiplegia - caused by: Cerebral infarction   Activity Tolerance Patient tolerated treatment well   Patient Left in bed;with call bell/phone within reach;with bed alarm set   Nurse Communication          Time: 3007-6226 OT Time Calculation (min):  38 min  Charges: OT General Charges $OT Visit: 1 Visit OT Treatments $Self Care/Home Management : 38-52 mins  Lenor Derrick., COTA/L Acute Rehabilitation Services [phone]    Barron Schmid 10/11/2020, 11:31 AM

## 2020-10-11 NOTE — Progress Notes (Signed)
STROKE TEAM PROGRESS NOTE   SUBJECTIVE (INTERVAL HISTORY) No acute events.  Vital signs are stable.  Neurological exam unchanged.  No new changes.   Await transfer to skilled nursing facility  OBJECTIVE Temp:  [98.3 F (36.8 C)-99.6 F (37.6 C)] 98.3 F (36.8 C) (04/28 1124) Pulse Rate:  [70-83] 70 (04/28 1124) Cardiac Rhythm: Heart block (04/28 0758) Resp:  [16-18] 16 (04/28 1124) BP: (120-151)/(77-99) 120/77 (04/28 1124) SpO2:  [92 %-100 %] 92 % (04/28 1124)  No results for input(s): GLUCAP in the last 168 hours. Recent Labs  Lab 10/05/20 0441 10/06/20 0324 10/07/20 0229 10/09/20 1424  NA 141 138 136 136  K 3.6 3.5 3.1* 3.5  CL 110 107 105 103  CO2 24 20* 22 24  GLUCOSE 100* 113* 99 126*  BUN 8 11 11 10   CREATININE 0.98 0.90 1.01 0.98  CALCIUM 7.6* 7.8* 7.5* 7.6*  MG  --   --   --  1.8  PHOS  --   --   --  3.2   No results for input(s): AST, ALT, ALKPHOS, BILITOT, PROT, ALBUMIN in the last 168 hours. Recent Labs  Lab 10/05/20 0441 10/06/20 0324 10/07/20 0229  WBC 6.5 10.4 13.8*  HGB 9.6* 10.1* 9.5*  HCT 28.6* 30.1* 28.7*  MCV 86.9 87.2 87.2  PLT 257 275 244   No results for input(s): CKTOTAL, CKMB, CKMBINDEX, TROPONINI in the last 168 hours. No results for input(s): LABPROT, INR in the last 72 hours. No results for input(s): COLORURINE, LABSPEC, PHURINE, GLUCOSEU, HGBUR, BILIRUBINUR, KETONESUR, PROTEINUR, UROBILINOGEN, NITRITE, LEUKOCYTESUR in the last 72 hours.  Invalid input(s): APPERANCEUR     Component Value Date/Time   CHOL 297 (H) 10/02/2020 0607   TRIG 62 10/02/2020 0607   HDL 73 10/02/2020 0607   CHOLHDL 4.1 10/02/2020 0607   VLDL 12 10/02/2020 0607   LDLCALC 212 (H) 10/02/2020 0607   Lab Results  Component Value Date   HGBA1C 5.3 10/02/2020      Component Value Date/Time   LABOPIA NONE DETECTED 10/05/2020 1321   COCAINSCRNUR NONE DETECTED 10/05/2020 1321   LABBENZ NONE DETECTED 10/05/2020 1321   AMPHETMU NONE DETECTED 10/05/2020 1321    THCU NONE DETECTED 10/05/2020 1321   LABBARB NONE DETECTED 10/05/2020 1321    No results for input(s): ETH in the last 168 hours.  I have personally reviewed the radiological images below and agree with the radiology interpretations.  MR BRAIN WO CONTRAST  Result Date: 10/02/2020 CLINICAL DATA:  Stroke follow-up.  Status post tPA. EXAM: MRI HEAD WITHOUT CONTRAST TECHNIQUE: Multiplanar, multiecho pulse sequences of the brain and surrounding structures were obtained without intravenous contrast. COMPARISON:  October 01, 2020 CT code stroke. FINDINGS: Brain: Acute infarcts in the anterior and posterior right frontal lobe involving cortex and subcortical white matter. Associated edema with sulcal effacement. No substantial midline shift. Mild curvilinear susceptibility artifact the regions of infarct likely represent petechial hemorrhage. No hydrocephalus. No mass lesion. 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 No extra-axial fluid collection. Basal cisterns are patent. Vascular: Focal susceptibility artifact within a right M2 branch in the region of the more posterior frontal lobe infarct, compatible with thrombus and known occlusion better characterized on recent CTA. Major arterial flow voids are maintained skull base. Skull and upper cervical spine: No focal marrow replacing lesion. Sinuses/Orbits: Clear sinuses.  Unremarkable orbits. Other: Large right mastoid effusion. IMPRESSION: 1. Acute infarcts in the anterior and posterior right frontal lobe. Associated edema with sulcal effacement.  Mild curvilinear susceptibility artifact the regions of infarct likely represent petechial hemorrhage. No mass occupying acute hemorrhage. 2. Focal susceptibility artifact within a right M2 MCA branch in the region of the more posterior frontal lobe infarct, compatible with thrombus and known occlusion better characterized on recent CTA. 3. 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 4.  Large right mastoid effusion. Electronically Signed   By: Feliberto Harts MD   On: 10/02/2020 16:38   DG CHEST PORT 1 VIEW  Result Date: 10/06/2020 CLINICAL DATA:  Fevers. EXAM: PORTABLE CHEST 1 VIEW COMPARISON:  10/04/2020 FINDINGS: Stable cardiac enlargement. No pleural effusion or edema. No airspace densities identified. Visualized osseous structures are notable for thoracic scoliosis. IMPRESSION: 1. No acute cardiopulmonary abnormalities. 2. Cardiac enlargement. Electronically Signed   By: Signa Kell M.D.   On: 10/06/2020 07:53   DG CHEST PORT 1 VIEW  Result Date: 10/04/2020 CLINICAL DATA:  Fever, stroke, left-sided weakness EXAM: PORTABLE CHEST 1 VIEW COMPARISON:  None. FINDINGS: No consolidation, features of edema, pneumothorax, or effusion. Pulmonary vascularity is normally distributed. The cardiomediastinal contours are unremarkable. No acute osseous or soft tissue abnormality. Telemetry leads overlie the chest. IMPRESSION: No acute cardiopulmonary abnormality. Electronically Signed   By: Kreg Shropshire M.D.   On: 10/04/2020 06:21   DG Abd Portable 1V  Result Date: 10/06/2020 CLINICAL DATA:  Ileus. EXAM: PORTABLE ABDOMEN - 1 VIEW COMPARISON:  None. FINDINGS: Air-filled loops of large and small bowel. The colon appears to be normal in caliber. Small bowel loops are mildly dilated. No free air, portal venous gas, or pneumatosis. There is a stool ball in the rectum. No other abnormalities. IMPRESSION: 1. Air-filled loops of large and small bowel. The small bowel appears to be mildly dilated, out of proportion of the colon. While ileus is possible, findings are concerning for early small bowel obstruction. 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  Stroke. EXAM: CT ANGIOGRAPHY CHEST WITH CONTRAST  TECHNIQUE: Multidetector CT imaging of the chest was performed using the standard protocol during bolus administration of intravenous contrast. Multiplanar CT image reconstructions and MIPs were obtained to evaluate the vascular anatomy. CONTRAST:  OMNIPAQUE IOHEXOL 350 MG/ML SOLN COMPARISON:  None. FINDINGS: Cardiovascular: Thoracic aorta is normal in caliber. Minimal aortic atherosclerosis. No dissection. Mild cardiomegaly. No pericardial effusion. No evidence of pulmonary vascular malformation. Normal normal pulmonary arterial or venous connection. Mediastinum/Nodes: No mediastinal or hilar adenopathy. Decompressed esophagus. Subcentimeter left thyroid nodule needs no further follow-up given size. Lungs/Pleura: No focal airspace disease. No pleural fluid. No pulmonary edema. No pulmonary nodule or mass Upper Abdomen: No acute or unexpected findings. Musculoskeletal: Dextroscoliosis of the thoracic spine with mild degenerative change. There are no acute or suspicious osseous abnormalities. Review of the MIP images confirms the above findings. IMPRESSION: 1. No pulmonary vascular malformation or acute intrathoracic abnormality. 2. Mild cardiomegaly. Aortic Atherosclerosis (ICD10-I70.0). Electronically Signed   By: Narda Rutherford M.D.   On: 10/09/2020 22:39   VAS [place] TRANSCRANIAL DOPPLER W BUBBLES  Result Date: 10/10/2020  Transcranial Doppler with Bubble Patient Name:  PEDROHENRIQUE MCCONVILLE  Date of Exam:   10/09/2020 Medical Rec #: [id number]          Accession #:    [id number] Date of Birth: June 12, 1976  Patient Gender: M Patient Age:   25Y Exam Location:  [facility] Procedure:      VAS [place] TRANSCRANIAL Demetrios Isaacs Referring Phys: [id number] Shyna Duignan S Tiarrah Saville --------------------------------------------------------------------------------  Indications: Stroke. Comparison Study: no prior Performing Technologist: Blanch Media RVS  Examination Guidelines: A complete evaluation includes B-mode  imaging, spectral Doppler, color Doppler, and power Doppler as needed of all accessible portions of each vessel. Bilateral testing is considered an integral part of a complete examination. Limited examinations for reoccurring indications may be performed as noted.  Summary:  A vascular evaluation was performed. The right middle cerebral artery was studied. An IV was inserted into the patient's right forearm . Verbal informed consent was obtained.  HITS heard at rest & valsalva: spencer grade 2 Positive TCD Bubble study ( Spencer grade 2 ) *See table(s) above for TCD measurements and observations.  Diagnosing physician: Delia Heady MD Electronically signed by Delia Heady MD on 10/10/2020 at 2:15:46 PM.    Final    ECHOCARDIOGRAM COMPLETE  Result Date: 10/02/2020    ECHOCARDIOGRAM REPORT   Patient Name:   TYRION GLAUDE Date of Exam: 10/02/2020 Medical Rec #:  [id number]         Height:       74.0 in Accession #:    [id number]        Weight:       131.4 lb Date of Birth:  [birth year]          BSA:          1.818 m Patient Age:    44 years          BP:           168/104 mmHg Patient Gender: M                 HR:           90 bpm. Exam Location:  Inpatient Procedure: 2D Echo Indications:    Stroke I63.9  History:        Patient has no prior history of Echocardiogram examinations.  Sonographer:    Thurman Coyer RDCS (AE) Referring Phys: [id number] Marvel Plan IMPRESSIONS  1. Left ventricular ejection fraction, by estimation, is 40 to 45%. The left ventricle has mildly decreased function. The left ventricle demonstrates global hypokinesis. There is mild concentric left ventricular hypertrophy. Left ventricular diastolic parameters are consistent with Grade I diastolic dysfunction (impaired relaxation). Elevated left atrial pressure.  2. Right ventricular systolic function is normal. The right ventricular size is normal.  3. The pericardial effusion is circumferential. There is no evidence of cardiac tamponade.  4. The  mitral valve is normal in structure. Trivial mitral valve regurgitation. No evidence of mitral stenosis.  5. The aortic valve is normal in structure. Aortic valve regurgitation is trivial. No aortic stenosis is present.  6. Aortic dilatation noted. There is mild dilatation of the aortic root, measuring 44 mm. There is mild dilatation of the ascending aorta, measuring 42 mm.  7. The inferior vena cava is normal in size with greater than 50% respiratory variability, suggesting right atrial pressure of 3 mmHg. FINDINGS  Left Ventricle: Left ventricular ejection fraction, by estimation, is 40 to 45%. The left ventricle has mildly decreased function. The left ventricle demonstrates global hypokinesis. The left ventricular internal cavity size was normal in size. There is  mild concentric left ventricular hypertrophy. Left ventricular diastolic parameters are consistent with Grade I diastolic dysfunction (impaired relaxation). Elevated left atrial pressure.  Right Ventricle: The right ventricular size is normal. No increase in right ventricular wall thickness. Right ventricular systolic function is normal. Left Atrium: Left atrial size was normal in size. Right Atrium: Right atrial size was normal in size. Pericardium: Trivial pericardial effusion is present. The pericardial effusion is circumferential. There is no evidence of cardiac tamponade. Mitral Valve: The mitral valve is normal in structure. Trivial mitral valve regurgitation. No evidence of mitral valve stenosis. Tricuspid Valve: The tricuspid valve is normal in structure. Tricuspid valve regurgitation is not demonstrated. No evidence of tricuspid stenosis. Aortic Valve: The aortic valve is normal in structure. Aortic valve regurgitation is trivial. No aortic stenosis is present. Aortic valve mean gradient measures 3.0 mmHg. Aortic valve peak gradient measures 5.1 mmHg. Aortic valve area, by VTI measures 3.31 cm. Pulmonic Valve: The pulmonic valve was normal in  structure. Pulmonic valve regurgitation is not visualized. No evidence of pulmonic stenosis. Aorta: The aortic root is normal in size and structure and aortic dilatation noted. There is mild dilatation of the aortic root, measuring 44 mm. There is mild dilatation of the ascending aorta, measuring 42 mm. Venous: The inferior vena cava is normal in size with greater than 50% respiratory variability, suggesting right atrial pressure of 3 mmHg. IAS/Shunts: No atrial level shunt detected by color flow Doppler.  LEFT VENTRICLE PLAX 2D LVIDd:         4.60 cm  Diastology LVIDs:         3.40 cm  LV e' medial:   3.81 cm/s LV PW:         1.40 cm  LV E/e' medial: 21.4 LV IVS:        1.40 cm LVOT diam:     2.40 cm LV SV:         64 LV SV Index:   35 LVOT Area:     4.52 cm  RIGHT VENTRICLE TAPSE (M-mode): 2.2 cm LEFT ATRIUM           Index       RIGHT ATRIUM           Index LA diam:      2.00 cm 1.10 cm/m  RA Area:     16.00 cm LA Vol (A2C): 42.7 ml 23.49 ml/m RA Volume:   42.30 ml  23.27 ml/m LA Vol (A4C): 45.1 ml 24.81 ml/m  AORTIC VALVE AV Area (Vmax):    3.97 cm AV Area (Vmean):   3.91 cm AV Area (VTI):     3.31 cm AV Vmax:           113.00 cm/s AV Vmean:          82.900 cm/s AV VTI:            0.193 m AV Peak Grad:      5.1 mmHg AV Mean Grad:      3.0 mmHg LVOT Vmax:         99.20 cm/s LVOT Vmean:        71.700 cm/s LVOT VTI:          0.141 m LVOT/AV VTI ratio: 0.73  AORTA Ao Root diam: 4.40 cm Ao Asc diam:  3.90 cm MITRAL VALVE MV Area (PHT): 4.41 cm    SHUNTS MV Decel Time: 172 msec    Systemic VTI:  0.14 m MV E velocity: 81.50 cm/s  Systemic Diam: 2.40 cm MV A velocity: 85.30 cm/s MV E/A ratio:  0.96 Mihai Croitoru MD Electronically signed by Thurmon Fair  MD Signature Date/Time: 10/02/2020/3:08:26 PM    Final    ECHO TEE  Result Date: 10/08/2020    TRANSESOPHOGEAL ECHO REPORT   Patient Name:   DANH BAYUS Date of Exam: 10/08/2020 Medical Rec #:  [id number]         Height: Accession #:    [id number]         Weight:       131.4 lb Date of Birth:  [birth year]          BSA:          1.526 m Patient Age:    44 years          BP:           164/86 mmHg Patient Gender: M                 HR:           91 bpm. Exam Location:  Inpatient Procedure: 2D Echo, Cardiac Doppler, Color Doppler and Saline Contrast Bubble            Study Indications:    Stroke  History:        Patient has prior history of Echocardiogram examinations, most                 recent 10/02/2020. Signs/Symptoms:Murmur; Risk                 Factors:Dyslipidemia and Hypertension.  Sonographer:    Ross Ludwig RDCS (AE) Referring Phys: [id number] Corrin Parker PROCEDURE: After discussion of the risks and benefits of a TEE, an informed consent was obtained from the patient. The transesophogeal probe was passed without difficulty through the esophogus of the patient. Sedation performed by different physician. The patient was monitored while under deep sedation. Anesthestetic sedation was provided intravenously by Anesthesiology: 170.46mg  of Propofol. Image quality was good. The patient's vital signs; including heart rate, blood pressure, and oxygen saturation; remained stable throughout the procedure. The patient developed no complications during the procedure. IMPRESSIONS  1. Left ventricular ejection fraction, by estimation, is 35 to 40%. The left ventricle has moderately decreased function. The left ventricle demonstrates global hypokinesis.  2. Right ventricular systolic function is normal. The right ventricular size is normal.  3. Left atrial size was mildly dilated. No left atrial/left atrial appendage thrombus was detected.  4. The mitral valve is normal in structure. Trivial mitral valve regurgitation. No evidence of mitral stenosis.  5. The aortic valve is normal in structure. Aortic valve regurgitation is not visualized. No aortic stenosis is present.  6. The inferior vena cava is normal in size with greater than 50% respiratory variability, suggesting  right atrial pressure of 3 mmHg.  7. A small pericardial effusion is present. The pericardial effusion is circumferential.  8. Agitated saline contrast bubble study was positive with shunting observed within 1 cardiac cycles suggestive of interatrial shunt. Conclusion(s)/Recommendation(s): Normal biventricular function without evidence of hemodynamically significant valvular heart disease. Positive TEE for PFO with bidirectional shunting. FINDINGS  Left Ventricle: Left ventricular ejection fraction, by estimation, is 35 to 40%. The left ventricle has moderately decreased function. The left ventricle demonstrates global hypokinesis. The left ventricular internal cavity size was normal in size. There is no left ventricular hypertrophy. Right Ventricle: The right ventricular size is normal. No increase in right ventricular wall thickness. Right ventricular systolic function is normal. Left Atrium: Left atrial size was mildly dilated. Spontaneous echo contrast was present in the left atrium and  left atrial appendage. No left atrial/left atrial appendage thrombus was detected. Right Atrium: Right atrial size was normal in size. Pericardium: A small pericardial effusion is present. The pericardial effusion is circumferential. Mitral Valve: The mitral valve is normal in structure. Trivial mitral valve regurgitation. No evidence of mitral valve stenosis. Tricuspid Valve: The tricuspid valve is normal in structure. Tricuspid valve regurgitation is mild . No evidence of tricuspid stenosis. Aortic Valve: The aortic valve is normal in structure. Aortic valve regurgitation is not visualized. No aortic stenosis is present. Pulmonic Valve: The pulmonic valve was normal in structure. Pulmonic valve regurgitation is trivial. No evidence of pulmonic stenosis. Aorta: The aortic root is normal in size and structure. Venous: The inferior vena cava is normal in size with greater than 50% respiratory variability, suggesting right atrial  pressure of 3 mmHg. IAS/Shunts: No atrial level shunt detected by color flow Doppler. Agitated saline contrast was given intravenously to evaluate for intracardiac shunting. Agitated saline contrast bubble study was positive with shunting observed within 3-6 cardiac cycles suggestive of interatrial shunt. [name] MD Electronically signed by [name] MD Signature Date/Time: 10/08/2020/1:26:23 PM    Final    CT HEAD CODE STROKE WO CONTRAST  Result Date: 10/01/2020 CLINICAL DATA:  Code stroke. Acute neuro deficit. Rule out stroke. Left facial droop EXAM: CT HEAD WITHOUT CONTRAST TECHNIQUE: Contiguous axial images were obtained from the base of the skull through the vertex without intravenous contrast. COMPARISON:  None. FINDINGS: Brain: Ventricle size and cerebral volume normal. Well-defined hypodensity left thalamus compatible with chronic infarction. Negative for acute infarct, hemorrhage, mass Vascular: Negative for hyperdense vessel Skull: Negative Sinuses/Orbits: Mild mucosal edema paranasal sinuses. Large right mastoid effusion and middle ear effusion. Left mastoid sinus clear. Other: None ASPECTS ([place] Stroke Program Early CT Score) - Ganglionic level infarction (caudate, lentiform nuclei, internal capsule, insula, M1-M3 cortex): 7 - Supraganglionic infarction (M4-M6 cortex): 3 Total score (0-10 with 10 being normal): 10 IMPRESSION: 1. No acute intracranial abnormality 2. ASPECTS is 10 3. Code stroke imaging results were communicated on 10/01/2020 at 6:19 pm to provider Roda Shutters via text page Electronically Signed   By: Marlan Palau M.D.   On: 10/01/2020 18:20   VAS [place] LOWER EXTREMITY VENOUS (DVT)  Result Date: 10/03/2020  Lower Venous DVT Study Indications: Stroke.  Comparison Study: no prior Performing Technologist: Blanch Media RVS  Examination Guidelines: A complete evaluation includes B-mode imaging, spectral Doppler, color Doppler, and power Doppler as needed of all accessible portions of  each vessel. Bilateral testing is considered an integral part of a complete examination. Limited examinations for reoccurring indications may be performed as noted. The reflux portion of the exam is performed with the patient in reverse Trendelenburg.  +---------+---------------+---------+-----------+----------+--------------+ RIGHT    CompressibilityPhasicitySpontaneityPropertiesThrombus Aging +---------+---------------+---------+-----------+----------+--------------+ CFV      Full           Yes      Yes                                 +---------+---------------+---------+-----------+----------+--------------+ SFJ      Full                                                        +---------+---------------+---------+-----------+----------+--------------+ FV Prox  Full                                                        +---------+---------------+---------+-----------+----------+--------------+  FV Mid   Full                                                        +---------+---------------+---------+-----------+----------+--------------+ FV DistalFull                                                        +---------+---------------+---------+-----------+----------+--------------+ PFV      Full                                                        +---------+---------------+---------+-----------+----------+--------------+ POP      Full           Yes      Yes                                 +---------+---------------+---------+-----------+----------+--------------+ PTV      Full                                                        +---------+---------------+---------+-----------+----------+--------------+ PERO     Full                                                        +---------+---------------+---------+-----------+----------+--------------+   +---------+---------------+---------+-----------+----------+--------------+ LEFT      CompressibilityPhasicitySpontaneityPropertiesThrombus Aging +---------+---------------+---------+-----------+----------+--------------+ CFV      Full           Yes      Yes                                 +---------+---------------+---------+-----------+----------+--------------+ SFJ      Full                                                        +---------+---------------+---------+-----------+----------+--------------+ FV Prox  Full                                                        +---------+---------------+---------+-----------+----------+--------------+ FV Mid   Full                                                        +---------+---------------+---------+-----------+----------+--------------+  FV DistalFull                                                        +---------+---------------+---------+-----------+----------+--------------+ PFV      Full                                                        +---------+---------------+---------+-----------+----------+--------------+ POP      Full           Yes      Yes                                 +---------+---------------+---------+-----------+----------+--------------+ PTV      Full                                                        +---------+---------------+---------+-----------+----------+--------------+ PERO     Full                                                        +---------+---------------+---------+-----------+----------+--------------+     Summary: BILATERAL: - No evidence of deep vein thrombosis seen in the lower extremities, bilaterally. - No evidence of superficial venous thrombosis in the lower extremities, bilaterally. -No evidence of popliteal cyst, bilaterally.   *See table(s) above for measurements and observations. Electronically signed by Heath Lark on 10/03/2020 at 4:38:55 PM.    Final    CT ANGIO HEAD NECK W WO CM (CODE STROKE)  Addendum Date:  10/01/2020   ADDENDUM REPORT: 10/01/2020 20:03 ADDENDUM: After further discussion with Dr. 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 Electronically Signed   By: Marlan Palau M.D.   On: 10/01/2020 20:03   Result Date: 10/01/2020 CLINICAL DATA:  Left facial droop EXAM: CT HEAD WITHOUT CONTRAST CT ANGIOGRAPHY HEAD AND NECK TECHNIQUE: Multidetector CT imaging of the head and neck was performed using the standard protocol during bolus administration of intravenous contrast. Multiplanar CT image reconstructions and MIPs were obtained to evaluate the vascular anatomy. Carotid stenosis measurements (when applicable) are obtained utilizing NASCET criteria, using the distal internal carotid diameter as the denominator. CONTRAST:  52mL OMNIPAQUE IOHEXOL 350 MG/ML SOLN COMPARISON:  CT head 10/01/2020 FINDINGS: CTA NECK FINDINGS Aortic arch: Standard branching. Imaged portion shows no evidence of aneurysm or dissection. No significant stenosis of the major arch vessel origins. Right carotid system: Normal right carotid. Negative for atherosclerotic disease or stenosis. Left carotid system: Normal left carotid. Negative for atherosclerotic disease or stenosis. Vertebral arteries: Both vertebral arteries widely patent without stenosis. Skeleton: Cervicothoracic scoliosis. No acute skeletal abnormality. Multiple caries. Periapical lucency around right lower molar. Other neck: No mass or adenopathy. 12 mm left thyroid nodule. No further imaging necessary. Upper chest: Lung apices clear  bilaterally. Review of the MIP images confirms the above findings CTA HEAD FINDINGS Anterior circulation: Anterior and middle cerebral arteries normal bilaterally. No stenosis or large vessel occlusion. Posterior circulation: Normal posterior circulation. No stenosis or large vessel occlusion. Venous sinuses: Normal venous enhancement Anatomic variants: None Delayed phase: Not performed Review of the MIP images  confirms the above findings IMPRESSION: Normal CT angiogram head and neck. No significant atherosclerotic disease. No stenosis or intracranial large vessel occlusion. Electronically Signed: By: Marlan Palau M.D. On: 10/01/2020 19:16    PHYSICAL EXAM  Temp:  [98.3 F (36.8 C)-99.6 F (37.6 C)] 98.3 F (36.8 C) (04/28 1124) Pulse Rate:  [70-83] 70 (04/28 1124) Resp:  [16-18] 16 (04/28 1124) BP: (120-151)/(77-99) 120/77 (04/28 1124) SpO2:  [92 %-100 %] 92 % (04/28 1124)  General -frail middle-age African-American male not in distress.  Ophthalmologic - fundi not visualized due to noncooperation.  Cardiovascular - Regular rhythm and rate.  Neuro - awake alert and oriented  to age, place, time and people. No aphasia, mild dysarthria with hypophonia but fluent language, following all simple commands. Able to name and repeat.  Mild dysarthria.  no gaze palsy now, visual field full no hemianopia, PERRL.  Left facial droop. Tongue protrusion to the left.  Right upper and lower extremity 4/5, left upper extremity deltoid 0/5 but bicep 2/5  , tricep and finger movement 0/5, left lower extremity 2-3/5 proximal and distal PF/DF 0/5.  Sensation decreased on the left, still has LLE sensory neglect.  Right finger-to-nose intact.  Gait not tested.    ASSESSMENT/PLAN Mr. MACAULAY REICHER is a 45 y.o. male with PMH of heart murmur not on medications presented to ER for acute onset left arm and leg weakness, left visual and sensory neglect, right gaze preference, left facial droop nausea/vomiting. tPA given.  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  Discussed with patient regarding risk and benefit of thrombectomy, patient declined thrombectomy.      Stroke: Right MCA infarct, embolic pattern, source cryptogenic CT head early ischemic changes right MCA but no hypodensity  CTA head neck was done showed right M2/M3 occlusion.   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  Patient counseled to be compliant with his antithrombotic medications  Ongoing aggressive stroke risk factor management  Therapy recommendations: SNF   Disposition: SNF  Cardiomyopathy  EF 40 to 45% TEE negative for intracardiac clot but positive for PFO  hypertensive urgency . Needed labetalol and Cleviprex IV before IV tPA for BP control . Stable on the high end . Off Cleviprex . On amlodipine 10   Long term BP goal normotensive  Hyperlipidemia  Home meds: None  LDL 212, goal < 70  Now on Lipitor 80  Continue statin at discharge  Dysphagia  Mild dysarthria  On dysphagia 2 and thin liquid  intermittent cough improving  Other Stroke Risk Factors  Hx of heart murmur  Klebsiella UTI  Leukocytosis WBC 10.9-> 8.1->7.4->6.5->10.4->13.8  Febrile 38.5->38.1  Discussed with [facility] pharmacist. She discussed with [facility] pharmacy to decide upon treatment: Keflex 500mg  po q 12 hours x 5-7 days    Other Active Problems  Hypokalemia K 3.1 ->3.6->3.5->3.1->pending  Hospital day # 10 Continue present management with aspirin and Plavix for 3 months and aggressive risk factor modification..  Medically stable to transfer to rehab when bed available.   Delia HeadyPramod Shireen Rayburn, MD    To contact Stroke Continuity provider, please refer to [email or web address]. After hours, contact General Neurology

## 2020-10-12 NOTE — Progress Notes (Addendum)
STROKE TEAM PROGRESS NOTE   SUBJECTIVE (INTERVAL HISTORY) VSS, no family at beside. Patient working  With therapy.  Neurological exam is unchanged OBJECTIVE Temp:  [98.1 F (36.7 C)-99.2 F (37.3 C)] 98.5 F (36.9 C) (04/29 1201) Pulse Rate:  [71-81] 81 (04/29 1201) Cardiac Rhythm: Normal sinus rhythm (04/29 0807) Resp:  [16-18] 18 (04/29 1201) BP: (114-159)/(79-93) 114/81 (04/29 1201) SpO2:  [97 %-100 %] 100 % (04/29 1201)  No results for input(s): GLUCAP in the last 168 hours. Recent Labs  Lab 10/06/20 0324 10/07/20 0229 10/09/20 1424  NA 138 136 136  K 3.5 3.1* 3.5  CL 107 105 103  CO2 20* 22 24  GLUCOSE 113* 99 126*  BUN 11 11 10   CREATININE 0.90 1.01 0.98  CALCIUM 7.8* 7.5* 7.6*  MG  --   --  1.8  PHOS  --   --  3.2   No results for input(s): AST, ALT, ALKPHOS, BILITOT, PROT, ALBUMIN in the last 168 hours. Recent Labs  Lab 10/06/20 0324 10/07/20 0229  WBC 10.4 13.8*  HGB 10.1* 9.5*  HCT 30.1* 28.7*  MCV 87.2 87.2  PLT 275 244   No results for input(s): CKTOTAL, CKMB, CKMBINDEX, TROPONINI in the last 168 hours. No results for input(s): LABPROT, INR in the last 72 hours. No results for input(s): COLORURINE, LABSPEC, PHURINE, GLUCOSEU, HGBUR, BILIRUBINUR, KETONESUR, PROTEINUR, UROBILINOGEN, NITRITE, LEUKOCYTESUR in the last 72 hours.  Invalid input(s): APPERANCEUR     Component Value Date/Time   CHOL 297 (H) 10/02/2020 0607   TRIG 62 10/02/2020 0607   HDL 73 10/02/2020 0607   CHOLHDL 4.1 10/02/2020 0607   VLDL 12 10/02/2020 0607   LDLCALC 212 (H) 10/02/2020 0607   Lab Results  Component Value Date   HGBA1C 5.3 10/02/2020      Component Value Date/Time   LABOPIA NONE DETECTED 10/05/2020 1321   COCAINSCRNUR NONE DETECTED 10/05/2020 1321   LABBENZ NONE DETECTED 10/05/2020 1321   AMPHETMU NONE DETECTED 10/05/2020 1321   THCU NONE DETECTED 10/05/2020 1321   LABBARB NONE DETECTED 10/05/2020 1321    No results for input(s): ETH in the last 168  hours.  I have personally reviewed the radiological images below and agree with the radiology interpretations.  MR BRAIN WO CONTRAST  Result Date: 10/02/2020 CLINICAL DATA:  Stroke follow-up.  Status post tPA. EXAM: MRI HEAD WITHOUT CONTRAST TECHNIQUE: Multiplanar, multiecho pulse sequences of the brain and surrounding structures were obtained without intravenous contrast. COMPARISON:  October 01, 2020 CT code stroke. FINDINGS: Brain: Acute infarcts in the anterior and posterior right frontal lobe involving cortex and subcortical white matter. Associated edema with sulcal effacement. No substantial midline shift. Mild curvilinear susceptibility artifact the regions of infarct likely represent petechial hemorrhage. No hydrocephalus. No mass lesion. 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 No extra-axial fluid collection. Basal cisterns are patent. Vascular: Focal susceptibility artifact within a right M2 branch in the region of the more posterior frontal lobe infarct, compatible with thrombus and known occlusion better characterized on recent CTA. Major arterial flow voids are maintained skull base. Skull and upper cervical spine: No focal marrow replacing lesion. Sinuses/Orbits: Clear sinuses.  Unremarkable orbits. Other: Large right mastoid effusion. IMPRESSION: 1. Acute infarcts in the anterior and posterior right frontal lobe. Associated edema with sulcal effacement. Mild curvilinear susceptibility artifact the regions of infarct likely represent petechial hemorrhage. No mass occupying acute hemorrhage. 2. Focal susceptibility artifact within a right M2 MCA branch in the region of the  more posterior frontal lobe infarct, compatible with thrombus and known occlusion better characterized on recent CTA. 3. 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 4. Large right mastoid effusion. Electronically Signed   By: Feliberto Harts MD   On: 10/02/2020 16:38   DG CHEST PORT 1  VIEW  Result Date: 10/06/2020 CLINICAL DATA:  Fevers. EXAM: PORTABLE CHEST 1 VIEW COMPARISON:  10/04/2020 FINDINGS: Stable cardiac enlargement. No pleural effusion or edema. No airspace densities identified. Visualized osseous structures are notable for thoracic scoliosis. IMPRESSION: 1. No acute cardiopulmonary abnormalities. 2. Cardiac enlargement. Electronically Signed   By: Signa Kell M.D.   On: 10/06/2020 07:53   DG CHEST PORT 1 VIEW  Result Date: 10/04/2020 CLINICAL DATA:  Fever, stroke, left-sided weakness EXAM: PORTABLE CHEST 1 VIEW COMPARISON:  None. FINDINGS: No consolidation, features of edema, pneumothorax, or effusion. Pulmonary vascularity is normally distributed. The cardiomediastinal contours are unremarkable. No acute osseous or soft tissue abnormality. Telemetry leads overlie the chest. IMPRESSION: No acute cardiopulmonary abnormality. Electronically Signed   By: Kreg Shropshire M.D.   On: 10/04/2020 06:21   DG Abd Portable 1V  Result Date: 10/06/2020 CLINICAL DATA:  Ileus. EXAM: PORTABLE ABDOMEN - 1 VIEW COMPARISON:  None. FINDINGS: Air-filled loops of large and small bowel. The colon appears to be normal in caliber. Small bowel loops are mildly dilated. No free air, portal venous gas, or pneumatosis. There is a stool ball in the rectum. No other abnormalities. IMPRESSION: 1. Air-filled loops of large and small bowel. The small bowel appears to be mildly dilated, out of proportion of the colon. While ileus is possible, findings are concerning for early small bowel obstruction. 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  Stroke. EXAM: CT ANGIOGRAPHY CHEST WITH CONTRAST TECHNIQUE: Multidetector CT imaging of the chest was performed using the standard protocol during bolus administration of  intravenous contrast. Multiplanar CT image reconstructions and MIPs were obtained to evaluate the vascular anatomy. CONTRAST:  OMNIPAQUE IOHEXOL 350 MG/ML SOLN COMPARISON:  None. FINDINGS: Cardiovascular: Thoracic aorta is normal in caliber. Minimal aortic atherosclerosis. No dissection. Mild cardiomegaly. No pericardial effusion. No evidence of pulmonary vascular malformation. Normal normal pulmonary arterial or venous connection. Mediastinum/Nodes: No mediastinal or hilar adenopathy. Decompressed esophagus. Subcentimeter left thyroid nodule needs no further follow-up given size. Lungs/Pleura: No focal airspace disease. No pleural fluid. No pulmonary edema. No pulmonary nodule or mass Upper Abdomen: No acute or unexpected findings. Musculoskeletal: Dextroscoliosis of the thoracic spine with mild degenerative change. There are no acute or suspicious osseous abnormalities. Review of the MIP images confirms the above findings. IMPRESSION: 1. No pulmonary vascular malformation or acute intrathoracic abnormality. 2. Mild cardiomegaly. Aortic Atherosclerosis (ICD10-I70.0). Electronically Signed   By: Narda Rutherford M.D.   On: 10/09/2020 22:39   VAS [place] TRANSCRANIAL DOPPLER W BUBBLES  Result Date: 10/10/2020  Transcranial Doppler with Bubble Patient Name:  RIVERS HAMRICK  Date of Exam:   10/09/2020 Medical Rec #: [id number]          Accession #:    [id number] Date of Birth: 03/08/1976           Patient Gender: M Patient Age:   44Y Exam Location:  [facility] Procedure:      VAS [place] TRANSCRANIAL DOPPLER W BUBBLES Referring Phys:  [id number] Nickey Canedo S Westyn Driggers --------------------------------------------------------------------------------  Indications: Stroke. Comparison Study: no prior Performing Technologist: Blanch Media RVS  Examination Guidelines: A complete evaluation includes B-mode imaging, spectral Doppler, color Doppler, and power Doppler as needed of all accessible portions of each vessel. Bilateral  testing is considered an integral part of a complete examination. Limited examinations for reoccurring indications may be performed as noted.  Summary:  A vascular evaluation was performed. The right middle cerebral artery was studied. An IV was inserted into the patient's right forearm . Verbal informed consent was obtained.  HITS heard at rest & valsalva: spencer grade 2 Positive TCD Bubble study ( Spencer grade 2 ) *See table(s) above for TCD measurements and observations.  Diagnosing physician: Delia Heady MD Electronically signed by Delia Heady MD on 10/10/2020 at 2:15:46 PM.    Final    ECHOCARDIOGRAM COMPLETE  Result Date: 10/02/2020    ECHOCARDIOGRAM REPORT   Patient Name:   JARRET TORRE Date of Exam: 10/02/2020 Medical Rec #:  [id number]         Height:       74.0 in Accession #:    [id number]        Weight:       131.4 lb Date of Birth:  [birth year]          BSA:          1.818 m Patient Age:    44 years          BP:           168/104 mmHg Patient Gender: M                 HR:           90 bpm. Exam Location:  Inpatient Procedure: 2D Echo Indications:    Stroke I63.9  History:        Patient has no prior history of Echocardiogram examinations.  Sonographer:    Thurman Coyer RDCS (AE) Referring Phys: [id number] Marvel Plan IMPRESSIONS  1. Left ventricular ejection fraction, by estimation, is 40 to 45%. The left ventricle has mildly decreased function. The left ventricle demonstrates global hypokinesis. There is mild concentric left ventricular hypertrophy. Left ventricular diastolic parameters are consistent with Grade I diastolic dysfunction (impaired relaxation). Elevated left atrial pressure.  2. Right ventricular systolic function is normal. The right ventricular size is normal.  3. The pericardial effusion is circumferential. There is no evidence of cardiac tamponade.  4. The mitral valve is normal in structure. Trivial mitral valve regurgitation. No evidence of mitral stenosis.  5. The aortic  valve is normal in structure. Aortic valve regurgitation is trivial. No aortic stenosis is present.  6. Aortic dilatation noted. There is mild dilatation of the aortic root, measuring 44 mm. There is mild dilatation of the ascending aorta, measuring 42 mm.  7. The inferior vena cava is normal in size with greater than 50% respiratory variability, suggesting right atrial pressure of 3 mmHg. FINDINGS  Left Ventricle: Left ventricular ejection fraction, by estimation, is 40 to 45%. The left ventricle has mildly decreased function. The left ventricle demonstrates global hypokinesis. The left ventricular internal cavity size was normal in size. There is  mild concentric left ventricular hypertrophy. Left ventricular diastolic parameters are consistent with Grade I diastolic dysfunction (impaired relaxation). Elevated left atrial pressure. Right Ventricle: The right ventricular size is normal. No increase in right ventricular wall thickness. Right ventricular systolic function is normal. Left Atrium: Left atrial size was normal  in size. Right Atrium: Right atrial size was normal in size. Pericardium: Trivial pericardial effusion is present. The pericardial effusion is circumferential. There is no evidence of cardiac tamponade. Mitral Valve: The mitral valve is normal in structure. Trivial mitral valve regurgitation. No evidence of mitral valve stenosis. Tricuspid Valve: The tricuspid valve is normal in structure. Tricuspid valve regurgitation is not demonstrated. No evidence of tricuspid stenosis. Aortic Valve: The aortic valve is normal in structure. Aortic valve regurgitation is trivial. No aortic stenosis is present. Aortic valve mean gradient measures 3.0 mmHg. Aortic valve peak gradient measures 5.1 mmHg. Aortic valve area, by VTI measures 3.31 cm. Pulmonic Valve: The pulmonic valve was normal in structure. Pulmonic valve regurgitation is not visualized. No evidence of pulmonic stenosis. Aorta: The aortic root is  normal in size and structure and aortic dilatation noted. There is mild dilatation of the aortic root, measuring 44 mm. There is mild dilatation of the ascending aorta, measuring 42 mm. Venous: The inferior vena cava is normal in size with greater than 50% respiratory variability, suggesting right atrial pressure of 3 mmHg. IAS/Shunts: No atrial level shunt detected by color flow Doppler.  LEFT VENTRICLE PLAX 2D LVIDd:         4.60 cm  Diastology LVIDs:         3.40 cm  LV e' medial:   3.81 cm/s LV PW:         1.40 cm  LV E/e' medial: 21.4 LV IVS:        1.40 cm LVOT diam:     2.40 cm LV SV:         64 LV SV Index:   35 LVOT Area:     4.52 cm  RIGHT VENTRICLE TAPSE (M-mode): 2.2 cm LEFT ATRIUM           Index       RIGHT ATRIUM           Index LA diam:      2.00 cm 1.10 cm/m  RA Area:     16.00 cm LA Vol (A2C): 42.7 ml 23.49 ml/m RA Volume:   42.30 ml  23.27 ml/m LA Vol (A4C): 45.1 ml 24.81 ml/m  AORTIC VALVE AV Area (Vmax):    3.97 cm AV Area (Vmean):   3.91 cm AV Area (VTI):     3.31 cm AV Vmax:           113.00 cm/s AV Vmean:          82.900 cm/s AV VTI:            0.193 m AV Peak Grad:      5.1 mmHg AV Mean Grad:      3.0 mmHg LVOT Vmax:         99.20 cm/s LVOT Vmean:        71.700 cm/s LVOT VTI:          0.141 m LVOT/AV VTI ratio: 0.73  AORTA Ao Root diam: 4.40 cm Ao Asc diam:  3.90 cm MITRAL VALVE MV Area (PHT): 4.41 cm    SHUNTS MV Decel Time: 172 msec    Systemic VTI:  0.14 m MV E velocity: 81.50 cm/s  Systemic Diam: 2.40 cm MV A velocity: 85.30 cm/s MV E/A ratio:  0.96 Mihai Croitoru MD Electronically signed by Thurmon FairMihai Croitoru MD Signature Date/Time: 10/02/2020/3:08:26 PM    Final    ECHO TEE  Result Date: 10/08/2020    TRANSESOPHOGEAL ECHO REPORT   Patient Name:  [name] Date of Exam: 10/08/2020 Medical Rec #:  [id number]         Height: Accession #:    [id number]        Weight:       131.4 lb Date of Birth:  May 26, 1976          BSA:          1.526 m Patient Age:    44 years           BP:           164/86 mmHg Patient Gender: M                 HR:           91 bpm. Exam Location:  Inpatient Procedure: 2D Echo, Cardiac Doppler, Color Doppler and Saline Contrast Bubble            Study Indications:    Stroke  History:        Patient has prior history of Echocardiogram examinations, most                 recent 10/02/2020. Signs/Symptoms:Murmur; Risk                 Factors:Dyslipidemia and Hypertension.  Sonographer:    Ross Ludwig RDCS (AE) Referring Phys: [id number] Corrin Parker PROCEDURE: After discussion of the risks and benefits of a TEE, an informed consent was obtained from the patient. The transesophogeal probe was passed without difficulty through the esophogus of the patient. Sedation performed by different physician. The patient was monitored while under deep sedation. Anesthestetic sedation was provided intravenously by Anesthesiology: 170.46mg  of Propofol. Image quality was good. The patient's vital signs; including heart rate, blood pressure, and oxygen saturation; remained stable throughout the procedure. The patient developed no complications during the procedure. IMPRESSIONS  1. Left ventricular ejection fraction, by estimation, is 35 to 40%. The left ventricle has moderately decreased function. The left ventricle demonstrates global hypokinesis.  2. Right ventricular systolic function is normal. The right ventricular size is normal.  3. Left atrial size was mildly dilated. No left atrial/left atrial appendage thrombus was detected.  4. The mitral valve is normal in structure. Trivial mitral valve regurgitation. No evidence of mitral stenosis.  5. The aortic valve is normal in structure. Aortic valve regurgitation is not visualized. No aortic stenosis is present.  6. The inferior vena cava is normal in size with greater than 50% respiratory variability, suggesting right atrial pressure of 3 mmHg.  7. A small pericardial effusion is present. The pericardial effusion is  circumferential.  8. Agitated saline contrast bubble study was positive with shunting observed within 1 cardiac cycles suggestive of interatrial shunt. Conclusion(s)/Recommendation(s): Normal biventricular function without evidence of hemodynamically significant valvular heart disease. Positive TEE for PFO with bidirectional shunting. FINDINGS  Left Ventricle: Left ventricular ejection fraction, by estimation, is 35 to 40%. The left ventricle has moderately decreased function. The left ventricle demonstrates global hypokinesis. The left ventricular internal cavity size was normal in size. There is no left ventricular hypertrophy. Right Ventricle: The right ventricular size is normal. No increase in right ventricular wall thickness. Right ventricular systolic function is normal. Left Atrium: Left atrial size was mildly dilated. Spontaneous echo contrast was present in the left atrium and left atrial appendage. No left atrial/left atrial appendage thrombus was detected. Right Atrium: Right atrial size was normal in size. Pericardium: A small pericardial effusion is present. The pericardial effusion  is circumferential. Mitral Valve: The mitral valve is normal in structure. Trivial mitral valve regurgitation. No evidence of mitral valve stenosis. Tricuspid Valve: The tricuspid valve is normal in structure. Tricuspid valve regurgitation is mild . No evidence of tricuspid stenosis. Aortic Valve: The aortic valve is normal in structure. Aortic valve regurgitation is not visualized. No aortic stenosis is present. Pulmonic Valve: The pulmonic valve was normal in structure. Pulmonic valve regurgitation is trivial. No evidence of pulmonic stenosis. Aorta: The aortic root is normal in size and structure. Venous: The inferior vena cava is normal in size with greater than 50% respiratory variability, suggesting right atrial pressure of 3 mmHg. IAS/Shunts: No atrial level shunt detected by color flow Doppler. Agitated saline  contrast was given intravenously to evaluate for intracardiac shunting. Agitated saline contrast bubble study was positive with shunting observed within 3-6 cardiac cycles suggestive of interatrial shunt. [name] MD Electronically signed by [name] MD Signature Date/Time: 10/08/2020/1:26:23 PM    Final    CT HEAD CODE STROKE WO CONTRAST  Result Date: 10/01/2020 CLINICAL DATA:  Code stroke. Acute neuro deficit. Rule out stroke. Left facial droop EXAM: CT HEAD WITHOUT CONTRAST TECHNIQUE: Contiguous axial images were obtained from the base of the skull through the vertex without intravenous contrast. COMPARISON:  None. FINDINGS: Brain: Ventricle size and cerebral volume normal. Well-defined hypodensity left thalamus compatible with chronic infarction. Negative for acute infarct, hemorrhage, mass Vascular: Negative for hyperdense vessel Skull: Negative Sinuses/Orbits: Mild mucosal edema paranasal sinuses. Large right mastoid effusion and middle ear effusion. Left mastoid sinus clear. Other: None ASPECTS ([place] Stroke Program Early CT Score) - Ganglionic level infarction (caudate, lentiform nuclei, internal capsule, insula, M1-M3 cortex): 7 - Supraganglionic infarction (M4-M6 cortex): 3 Total score (0-10 with 10 being normal): 10 IMPRESSION: 1. No acute intracranial abnormality 2. ASPECTS is 10 3. Code stroke imaging results were communicated on 10/01/2020 at 6:19 pm to provider Roda Shutters via text page Electronically Signed   By: Marlan Palau M.D.   On: 10/01/2020 18:20   VAS [place] LOWER EXTREMITY VENOUS (DVT)  Result Date: 10/03/2020  Lower Venous DVT Study Indications: Stroke.  Comparison Study: no prior Performing Technologist: Blanch Media RVS  Examination Guidelines: A complete evaluation includes B-mode imaging, spectral Doppler, color Doppler, and power Doppler as needed of all accessible portions of each vessel. Bilateral testing is considered an integral part of a complete examination. Limited  examinations for reoccurring indications may be performed as noted. The reflux portion of the exam is performed with the patient in reverse Trendelenburg.  +---------+---------------+---------+-----------+----------+--------------+ RIGHT    CompressibilityPhasicitySpontaneityPropertiesThrombus Aging +---------+---------------+---------+-----------+----------+--------------+ CFV      Full           Yes      Yes                                 +---------+---------------+---------+-----------+----------+--------------+ SFJ      Full                                                        +---------+---------------+---------+-----------+----------+--------------+ FV Prox  Full                                                        +---------+---------------+---------+-----------+----------+--------------+  FV Mid   Full                                                        +---------+---------------+---------+-----------+----------+--------------+ FV DistalFull                                                        +---------+---------------+---------+-----------+----------+--------------+ PFV      Full                                                        +---------+---------------+---------+-----------+----------+--------------+ POP      Full           Yes      Yes                                 +---------+---------------+---------+-----------+----------+--------------+ PTV      Full                                                        +---------+---------------+---------+-----------+----------+--------------+ PERO     Full                                                        +---------+---------------+---------+-----------+----------+--------------+   +---------+---------------+---------+-----------+----------+--------------+ LEFT     CompressibilityPhasicitySpontaneityPropertiesThrombus Aging  +---------+---------------+---------+-----------+----------+--------------+ CFV      Full           Yes      Yes                                 +---------+---------------+---------+-----------+----------+--------------+ SFJ      Full                                                        +---------+---------------+---------+-----------+----------+--------------+ FV Prox  Full                                                        +---------+---------------+---------+-----------+----------+--------------+ FV Mid   Full                                                        +---------+---------------+---------+-----------+----------+--------------+   FV DistalFull                                                        +---------+---------------+---------+-----------+----------+--------------+ PFV      Full                                                        +---------+---------------+---------+-----------+----------+--------------+ POP      Full           Yes      Yes                                 +---------+---------------+---------+-----------+----------+--------------+ PTV      Full                                                        +---------+---------------+---------+-----------+----------+--------------+ PERO     Full                                                        +---------+---------------+---------+-----------+----------+--------------+     Summary: BILATERAL: - No evidence of deep vein thrombosis seen in the lower extremities, bilaterally. - No evidence of superficial venous thrombosis in the lower extremities, bilaterally. -No evidence of popliteal cyst, bilaterally.   *See table(s) above for measurements and observations. Electronically signed by Heath Lark on 10/03/2020 at 4:38:55 PM.    Final    CT ANGIO HEAD NECK W WO CM (CODE STROKE)  Addendum Date: 10/01/2020   ADDENDUM REPORT: 10/01/2020 20:03 ADDENDUM: After further  discussion with Dr. 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 Electronically Signed   By: Marlan Palau M.D.   On: 10/01/2020 20:03   Result Date: 10/01/2020 CLINICAL DATA:  Left facial droop EXAM: CT HEAD WITHOUT CONTRAST CT ANGIOGRAPHY HEAD AND NECK TECHNIQUE: Multidetector CT imaging of the head and neck was performed using the standard protocol during bolus administration of intravenous contrast. Multiplanar CT image reconstructions and MIPs were obtained to evaluate the vascular anatomy. Carotid stenosis measurements (when applicable) are obtained utilizing NASCET criteria, using the distal internal carotid diameter as the denominator. CONTRAST:  33mL OMNIPAQUE IOHEXOL 350 MG/ML SOLN COMPARISON:  CT head 10/01/2020 FINDINGS: CTA NECK FINDINGS Aortic arch: Standard branching. Imaged portion shows no evidence of aneurysm or dissection. No significant stenosis of the major arch vessel origins. Right carotid system: Normal right carotid. Negative for atherosclerotic disease or stenosis. Left carotid system: Normal left carotid. Negative for atherosclerotic disease or stenosis. Vertebral arteries: Both vertebral arteries widely patent without stenosis. Skeleton: Cervicothoracic scoliosis. No acute skeletal abnormality. Multiple caries. Periapical lucency around right lower molar. Other neck: No mass or adenopathy. 12 mm left thyroid nodule. No further imaging necessary. Upper chest: Lung apices clear  bilaterally. Review of the MIP images confirms the above findings CTA HEAD FINDINGS Anterior circulation: Anterior and middle cerebral arteries normal bilaterally. No stenosis or large vessel occlusion. Posterior circulation: Normal posterior circulation. No stenosis or large vessel occlusion. Venous sinuses: Normal venous enhancement Anatomic variants: None Delayed phase: Not performed Review of the MIP images confirms the above findings IMPRESSION: Normal CT angiogram head and  neck. No significant atherosclerotic disease. No stenosis or intracranial large vessel occlusion. Electronically Signed: By: Marlan Palau M.D. On: 10/01/2020 19:16    PHYSICAL EXAM  Temp:  [98.1 F (36.7 C)-99.2 F (37.3 C)] 98.5 F (36.9 C) (04/29 1201) Pulse Rate:  [71-81] 81 (04/29 1201) Resp:  [16-18] 18 (04/29 1201) BP: (114-159)/(79-93) 114/81 (04/29 1201) SpO2:  [97 %-100 %] 100 % (04/29 1201)  General -frail middle-age African-American male not in distress.  Ophthalmologic - fundi not visualized due to noncooperation.  Cardiovascular - Regular rhythm and rate.  Neuro - awake alert and oriented  to age, place, time and people. No aphasia, mild dysarthria with hypophonia but fluent language, following all simple commands. Able to name and repeat.  Mild dysarthria.  no gaze palsy now, visual field full no hemianopia, PERRL.  Left facial droop. Tongue protrusion to the left.  Right upper and lower extremity 4/5, left upper extremity deltoid 0/5 but bicep 2/5  , tricep and finger movement 0/5, left lower extremity 2-3/5 proximal and distal PF/DF 0/5.  Sensation decreased on the left, still has LLE sensory neglect.  Right finger-to-nose intact.  Gait not tested.    ASSESSMENT/PLAN [name] is a 45 y.o. male with PMH of heart murmur not on medications presented to ER for acute onset left arm and leg weakness, left visual and sensory neglect, right gaze preference, left facial droop nausea/vomiting. tPA given.  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  Discussed with patient regarding risk and benefit of thrombectomy, patient declined thrombectomy.      Stroke: Right MCA infarct, embolic pattern, source cryptogenic CT head early ischemic changes right MCA but no hypodensity  CTA head neck was done showed right M2/M3 occlusion.   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  Patient counseled to be compliant with his antithrombotic medications  Ongoing aggressive stroke risk factor management  Therapy recommendations: SNF   Disposition: SNF  Cardiomyopathy  EF 40 to 45% TEE negative for intracardiac clot but positive for PFO  hypertensive urgency . Needed labetalol and Cleviprex IV before IV tPA for BP control . Stable on the high end . Off Cleviprex . On amlodipine 10   Long term BP goal normotensive  Hyperlipidemia  Home meds: None  LDL 212, goal < 70  Now on Lipitor 80  Continue statin at discharge  Dysphagia  Mild dysarthria  On dysphagia 2 and thin liquid  intermittent cough improving  Other Stroke Risk Factors  Hx of heart murmur  Klebsiella UTI  Leukocytosis WBC 10.9-> 8.1->7.4->6.5->10.4->13.8  Febrile 38.5->38.1  Discussed with 3W pharmacist. She discussed with ID pharmacy to decide upon treatment: Keflex 500mg  po q 12 hours x 5-7 days    Other Active Problems  Hypokalemia K 3.1 ->3.6->3.5->3.1->pending  Hospital day # 11  Valentina Lucks, MSN, NP-C [facility] [phone] I have personally obtained history,examined this patient, reviewed notes, independently viewed imaging studies, participated in medical decision making and plan of care.ROS completed by me personally and pertinent positives fully documented  I have made any additions or clarifications directly to the above note. Agree with note above.  Patient medically stable to be transferred for rehabilitation and awake insurance approval and bed  availability.  Delia Heady, MD Medical Director [facility] Stroke Center Pager: [phone] 10/12/2020 5:01 PM    To contact Stroke Continuity provider, please refer to [email or web address]. After hours, contact General Neurology

## 2020-10-12 NOTE — [place] FL2 (Addendum)
[place] MEDICAID FL2 LEVEL OF CARE SCREENING TOOL     IDENTIFICATION  Patient Name: Harry [name] Birthdate: 1976/02/09 Sex: male Admission Date (Current Location): 10/01/2020  [facility] and [place] Number:  Producer, television/film/video and Address:  [facility] [place], [address], [place], [place] [address]      Provider Number: [id number]  Attending Physician Name and Address:  Stroke, Md, MD  Relative Name and Phone Number:       Current Level of Care: Hospital Recommended Level of Care: Skilled Nursing Facility Prior Approval Number:    Date Approved/Denied:   PASRR Number: [id number]  Discharge Plan: SNF    Current Diagnoses: Patient Active Problem List   Diagnosis Date Noted  . Cerebral infarction (HCC)   . Benign essential HTN   . FUO (fever of unknown origin)   . Heart murmur   . Hypokalemia   . Stroke (cerebrum) (HCC) 10/01/2020    Orientation RESPIRATION BLADDER Height & Weight     Self,Time,Situation,Place  Normal Continent Weight: 131 lb 6.3 oz (59.6 kg) Height:  5\' 7"  (170.2 cm)  BEHAVIORAL SYMPTOMS/MOOD NEUROLOGICAL BOWEL NUTRITION STATUS      Continent Diet (see DC Summary)  AMBULATORY STATUS COMMUNICATION OF NEEDS Skin   Extensive Assist Verbally Normal                       Personal Care Assistance Level of Assistance  Bathing,Feeding,Dressing Bathing Assistance: Maximum assistance Feeding assistance: Limited assistance Dressing Assistance: Maximum assistance     Functional Limitations Info  Speech     Speech Info: Impaired (dysarthria)    SPECIAL CARE FACTORS FREQUENCY  PT (By licensed PT),OT (By licensed OT)     PT Frequency: 5x/wk OT Frequency: 5x/wk            Contractures Contractures Info: Not present    Additional Factors Info  Code Status,Allergies Code Status Info: Full Allergies Info: NKA           Current Medications (10/12/2020):  This is the current hospital active medication  list Current Facility-Administered Medications  Medication Dose Route Frequency Provider Last Rate Last Admin  .  stroke: mapping our early stages of recovery book   Does not apply Once 10/14/2020, MD      . acetaminophen (TYLENOL) tablet 650 mg  650 mg Oral Q4H PRN Marvel Plan, MD   650 mg at 10/10/20 0459   Or  . acetaminophen (TYLENOL) 160 MG/5ML solution 650 mg  650 mg Per Tube Q4H PRN 10/12/20, MD       Or  . acetaminophen (TYLENOL) suppository 650 mg  650 mg Rectal Q4H PRN Marvel Plan, MD      . amLODipine (NORVASC) tablet 10 mg  10 mg Oral Daily Marvel Plan, MD   10 mg at 10/12/20 10/14/20  . aspirin chewable tablet 81 mg  81 mg Oral Daily 8938, MD   81 mg at 10/12/20 10/14/20  . atorvastatin (LIPITOR) tablet 80 mg  80 mg Oral Daily 1017, MD   80 mg at 10/12/20 10/14/20  . cephALEXin (KEFLEX) capsule 500 mg  500 mg Oral Q12H Bailey-Modzik, Delila A, NP   500 mg at 10/12/20 0938  . chlorhexidine (PERIDEX) 0.12 % solution 15 mL  15 mL Mouth Rinse BID 10/14/20, MD   15 mL at 10/12/20 0938  . Chlorhexidine Gluconate Cloth 2 % PADS 6 each  6 each Topical  Daily Marvel Plan, MD   6 each at 10/11/20 [id number]  . clopidogrel (PLAVIX) tablet 75 mg  75 mg Oral Daily Marvel Plan, MD   75 mg at 10/12/20 9983  . enoxaparin (LOVENOX) injection 30 mg  30 mg Subcutaneous Q24H Marvel Plan, MD   30 mg at 10/11/20 2201  . labetalol (NORMODYNE) injection 5-20 mg  5-20 mg Intravenous Q2H PRN Marvel Plan, MD   20 mg at 10/03/20 2125  . MEDLINE mouth rinse  15 mL Mouth Rinse q12n4p Marvel Plan, MD   15 mL at 10/11/20 1559  . pantoprazole sodium (PROTONIX) 40 mg/20 mL oral suspension 40 mg  40 mg Oral QHS Leander Rams, RPH   40 mg at 10/11/20 2200  . polyethylene glycol (MIRALAX / GLYCOLAX) packet 17 g  17 g Oral Daily PRN Marvel Plan, MD   17 g at 10/07/20 2124  . senna-docusate (Senokot-S) tablet 1 tablet  1 tablet Oral QHS PRN Marvel Plan, MD   1 tablet at 10/06/20 2204     Discharge  Medications: Please see discharge summary for a list of discharge medications.  Relevant Imaging Results:  Relevant Lab Results:   Additional Information SS#: [id number]  Harry Lenis, LCSW  I have personally obtained history,examined this patient, reviewed notes, independently viewed imaging studies, participated in medical decision making and plan of care.ROS completed by me personally and pertinent positives fully documented  I have made any additions or clarifications directly to the above note. Agree with note above.    Delia Heady, MD Medical Director [facility] Stroke Center Pager: [phone] 10/12/2020 1:26 PM

## 2020-10-12 NOTE — TOC Progression Note (Signed)
Transition of Care [facility]) - Progression Note    Patient Details  Name: Harry [name] MRN: [id number] Date of Birth: Aug 10, 1975  [facility]) CM/SW Contact  [name], [place] Phone Number: 10/12/2020, 10:10 AM  Clinical Narrative:   CSW notified by Financial Counseling that Medicaid application has been submitted and referral for disability has been sent to the [facility]. CSW faxed out referral for [facility], awaiting bed offers.    Expected Discharge Plan: Skilled Nursing Facility Barriers to Discharge: Continued Medical Work up,Inadequate or no insurance  Expected Discharge Plan and Services Expected Discharge Plan: Skilled Nursing Facility     Post Acute Care Choice: Skilled Nursing Facility Living arrangements for the past 2 months: Apartment                                       Social Determinants of Health (SDOH) Interventions    Readmission Risk Interventions No flowsheet data found.

## 2020-10-12 NOTE — Progress Notes (Signed)
  Speech Language Pathology Treatment: Dysphagia;Cognitive-Linquistic  Patient Details Name: Harry [name] MRN: [id number] DOB: November 28, 1975 Today's Date: 10/12/2020 Time: 7616-0737 SLP Time Calculation (min) (ACUTE ONLY): 23 min  Assessment / Plan / Recommendation Clinical Impression  SWALLOWING Pt had opinions about what snacks were acceptable, but was agreeable to graham cracker with peanut butter on it, which is a fairly challenging food for oral clearance.  Pt fed himself independently with good pacing and appropriate bite size.  Given additional time, pt was able to fully clear oral cavity.  There was intermittent anterior loss of of secretions from left.  When given a towel, pt was able to manage secretions with some success.   Recommend continuing mechanical soft diet with thin liquids.    COGNITION Pt completed three planning/sequencing tasks >80% accuracy with minimal verbal cuing.  Pt was noted to self correct x1 during this activity.  Pt completed calculations for time, money, and medication management with 60% accuracy independently, although slowed processing time was noted.  With moderate verbal and visual cuing, accuracy improved to 100%.  Pt has managed medications for family member in the past, but has not used a pill box as he was able to recall when to give what medications at what amount.  Pt might benefit from pill box assessment/training.  Pt demonstrated ability to solve functional problems with use of towel for secretion management as noted above.  Pt did take longer to answer questions/solve problems as session progressed and appeared to become slightly less attentive.  Pt had just completed work with OT (of which he was very proud! - he shared his achievements at end of session) on SLP arrival and fatigue may have also played a roll. Recommend ongoing ST intervention with continuing ST at next level of care.     HPI HPI: 45 y.o. RH-male with history of heart murmur who  was admitted on 10/01/2020 with acute onset of left hemiparesis with sensory deficits,  right gaze preference and N/V.  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      SLP Plan  Continue with current plan of care       Recommendations  Diet recommendations: Dysphagia 3 (mechanical soft);Thin liquid Liquids provided via: Cup;Straw Medication Administration: Whole meds with liquid Supervision: Patient able to self feed;Staff to assist with self feeding;Full supervision/cueing for compensatory strategies Compensations: Lingual sweep for clearance of pocketing;Follow solids with liquid;Small sips/bites;Slow rate;Monitor for anterior loss;Minimize environmental distractions Postural Changes and/or Swallow Maneuvers: Seated upright 90 degrees                Oral Care Recommendations: Oral care BID Follow up Recommendations: 24 hour supervision/assistance;Skilled Nursing facility SLP Visit Diagnosis: Dysphagia, oral phase (R13.11);Dysphagia, unspecified (R13.10);Cognitive communication deficit (R41.841) Plan: Continue with current plan of care       GO                Kerrie Pleasure, MA, CCC-SLP Acute Rehabilitation Services Office: [phone] 10/12/2020, 12:00 PM

## 2020-10-12 NOTE — Progress Notes (Signed)
Physical Therapy Treatment Patient Details Name: Harry [name] MRN: [id number] DOB: Oct 29, 1975 Today's Date: 10/12/2020    History of Present Illness 45 yo male presenting to ED with left sided weakness. CT showing early ischemic changes around right MCA but no hypodensity. tPA given on 4/18 @1823 . 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 Awaiting MRI. PMH including heart murmur not on medications.    PT Comments    Pt with very successful OT session this morning reporting fatigue on entry. Focus of session was to work on walking in hallway. Pt first eager to show how he could scoot to drop arm recliner, requiring light minA. Pt wheeled to hallway where he could use hand rain for assist on R side. Hallway too busy and pt with increased difficulty focusing on task at hand. Pt able to ambulate 10 feet and then 6 feet before increased fatigue and decreased safety. Able to work on limited seated exercise before pt completely exhausted. Will continue to work on ambulation.     Follow Up Recommendations  SNF     Equipment Recommendations  3in1 (PT);Wheelchair cushion (measurements PT);Wheelchair (measurements PT)       Precautions / Restrictions Precautions Precautions: Fall;Other (comment) Precaution Comments: LUE hemiparesis, subluxing LUE    Mobility  Bed Mobility Overal bed mobility: Needs Assistance Bed Mobility: Supine to Sit     Supine to sit: Min guard     General bed mobility comments: min guard for safety, increased time and effort, requires vc for sequencing    Transfers Overall transfer level: Needs assistance   Transfers: Lateral/Scoot Transfers          Lateral/Scoot Transfers: Min assist;Min guard General transfer comment: mostly min guard for scooting to drop arm recliner on his R, min A for scoot over transition between bed and chair. increased time and mental attention as he problem solved the task  Ambulation/Gait Ambulation/Gait  assistance: Max assist;+2 physical assistance Gait Distance (Feet): 10 Feet (1x10, 1x6) Assistive device: 2 person hand held assist (handrail in hallway) Gait Pattern/deviations: Step-through pattern;Narrow base of support;Decreased weight shift to left;Decreased stance time - left;Decreased step length - right Gait velocity: decr Gait velocity interpretation: <1.31 ft/sec, indicative of household ambulator General Gait Details: focus to work on L LE mechanics with gait, with use of handrail in hallway on R side, Tech with support at waist and therapist in front on stool assisting L LE mechanics, pt with increased physical and mental fatigue,    Modified Rankin (Stroke Patients Only) Modified Rankin (Stroke Patients Only) Pre-Morbid Rankin Score: No symptoms Modified Rankin: Moderately severe disability     Balance Overall balance assessment: Needs assistance Sitting-balance support: Feet supported;Single extremity supported Sitting balance-Leahy Scale: Fair Sitting balance - Comments: close guard for safety statically, requires light assist dynamically   Standing balance support: Bilateral upper extremity supported;During functional activity Standing balance-Leahy Scale: Poor                              Cognition Arousal/Alertness: Awake/alert Behavior During Therapy: Flat affect Overall Cognitive Status: Impaired/Different from baseline Area of Impairment: Following commands;Safety/judgement;Awareness;Attention;Problem solving                   Current Attention Level: Sustained   Following Commands: Follows one step commands with increased time;Follows multi-step commands inconsistently Safety/Judgement: Decreased awareness of safety Awareness: Emergent Problem Solving: Slow processing;Difficulty sequencing;Requires verbal cues General Comments:  took pt in hallway today for gait training and pt over stimulated with increased difficulty with keeping on  task      Exercises General Exercises - Lower Extremity Long Arc Quad: AAROM;Left;10 reps;AROM;Right;Seated Hip ABduction/ADduction: Strengthening;Left;10 reps;Right;Seated Hip Flexion/Marching: AAROM;Left;10 reps;AROM;Right;Seated    General Comments General comments (skin integrity, edema, etc.): VSS      Pertinent Vitals/Pain Pain Assessment: Faces Faces Pain Scale: No hurt            PT Goals (current goals can now be found in the care plan section) Acute Rehab PT Goals Patient Stated Goal: none stated today PT Goal Formulation: With patient/family Time For Goal Achievement: 10/16/20 Potential to Achieve Goals: Good Progress towards PT goals: Progressing toward goals    Frequency    Min 5X/week      PT Plan Current plan remains appropriate       AM-PAC PT "6 Clicks" Mobility   Outcome Measure  Help needed turning from your back to your side while in a flat bed without using bedrails?: A Lot Help needed moving from lying on your back to sitting on the side of a flat bed without using bedrails?: A Lot Help needed moving to and from a bed to a chair (including a wheelchair)?: A Lot Help needed standing up from a chair using your arms (e.g., wheelchair or bedside chair)?: A Lot Help needed to walk in hospital room?: A Lot Help needed climbing 3-5 steps with a railing? : Total 6 Click Score: 11    End of Session Equipment Utilized During Treatment: Gait belt Activity Tolerance: Patient limited by fatigue Patient left: with call bell/phone within reach;in chair;with chair alarm set Nurse Communication: Mobility status PT Visit Diagnosis: Unsteadiness on feet (R26.81);Other abnormalities of gait and mobility (R26.89);Difficulty in walking, not elsewhere classified (R26.2);Hemiplegia and hemiparesis Hemiplegia - Right/Left: Left Hemiplegia - dominant/non-dominant: Non-dominant Hemiplegia - caused by: Cerebral infarction     Time: 5035-4656 PT Time  Calculation (min) (ACUTE ONLY): 36 min  Charges:  $Gait Training: 23-37 mins                     Cybill Uriegas B. Beverely Risen PT, DPT Acute Rehabilitation Services Pager [phone] Office [phone]    Elon Alas Fleet 10/12/2020, 4:18 PM

## 2020-10-12 NOTE — Progress Notes (Signed)
Occupational Therapy Treatment Patient Details Name: Harry [name] MRN: [id number] DOB: [birth year] Today's Date: 10/12/2020    History of present illness 45 yo male presenting to ED with left sided weakness. CT showing early ischemic changes around right MCA but no hypodensity. tPA given on 4/18 @1823 . 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 Awaiting MRI. PMH including heart murmur not on medications.   OT comments  Pt making good progress towards OT goals this session. Session focus standing balance at sink with pt able to stand a total of 7 mins for grooming tasks ( 1 seated rest break in between), LUE therex, bilateral tasks and functional grasp and release with L hand. Pt additionally able to complete lateral scoot transfer from EOB<>recliner with MIN A +1. Pt completed dynamic standing tasks at sink in standing with LLE blocked and MIN A +2 for balance, continued work on using LUE as stabilizer during grooming tasks. Added in some bilateral integration tasks with theraputty and squeeze ball, pt able to pass ball from R<>L and worked on shooting ball into sink with hand over hand assist in sitting. Pt seemed excited about progress made this session reporting to SLP that he was able to "shimmy" into the recliner today. Will continue to follow acutely per POC.    Follow Up Recommendations  SNF    Equipment Recommendations  Other (comment) (defer to next venue of care)    Recommendations for Other Services      Precautions / Restrictions Precautions Precautions: Fall;Other (comment) Precaution Comments: LUE hemiparesis, subluxing LUE Restrictions Weight Bearing Restrictions: No       Mobility Bed Mobility Overal bed mobility: Needs Assistance Bed Mobility: Supine to Sit;Sit to Supine     Supine to sit: Min assist;HOB elevated Sit to supine: Min assist;HOB elevated   General bed mobility comments: MIN A +1 for bed mobility this session with pt using RLE  underneath LLE to maneuver BLEs in/out of bed    Transfers Overall transfer level: Needs assistance Equipment used: None Transfers: Lateral/Scoot Transfers Sit to Stand: Min assist;+2 physical assistance        Lateral/Scoot Transfers: Min assist General transfer comment: pt completed multiple sit<>stands from recliner at sink to work on grooming tasks, pt additionally able to laterally scoot from EOB<>recliner towards pts R side with MIN A +1, cues needed for hand positioning and pt requried external assist to manage LUE during transfer.    Balance Overall balance assessment: Needs assistance Sitting-balance support: Feet supported;Single extremity supported Sitting balance-Leahy Scale: Fair Sitting balance - Comments: sitting EOB statically wtih close supervision for UB dressing   Standing balance support: Single extremity supported;During functional activity Standing balance-Leahy Scale: Poor Standing balance comment: MIN A +2 for standing grooming tasks at sink                           ADL either performed or assessed with clinical judgement   ADL       Grooming: Oral care;Standing;Minimal assistance;Wash/dry face Grooming Details (indicate cue type and reason): cues for compensatory methods using LUE as stabilizer while pt stands at sink with MIN A for standing balance and support at LLE         Upper Body Dressing : Minimal assistance;Sitting Upper Body Dressing Details (indicate cue type and reason): pt able to place LUE in sleeve as gown using RUE as assist     Toilet Transfer: Minimal assistance Toilet  Transfer Details (indicate cue type and reason): lateral scoot transfer from EOB<>recliner with MIN A +1         Functional mobility during ADLs: Minimal assistance (lateral scoot transfer from EOB<>recliner) General ADL Comments: session focus standing balance at sink with pt able to stand a total of 7 mins for grooming tasks, LUE therex, bilateral  tasks and functional grasp and release with L hand     Vision       Perception     Praxis      Cognition Arousal/Alertness: Awake/alert Behavior During Therapy: WFL for tasks assessed/performed;Impulsive (impulsive as times once pt is exciting about something) Overall Cognitive Status: Impaired/Different from baseline Area of Impairment: Following commands;Safety/judgement;Awareness;Attention;Problem solving;Memory                   Current Attention Level: Sustained Memory: Decreased short-term memory (reapeated cues for safety and LUE mgmt) Following Commands: Follows one step commands with increased time;Follows multi-step commands inconsistently Safety/Judgement: Decreased awareness of safety;Decreased awareness of deficits Awareness: Emergent Problem Solving: Slow processing;Difficulty sequencing;Requires verbal cues General Comments: pt very plesant and seems exicted about his progress eager to tell SLP that he "shimmed" into the recliner        Exercises Other Exercises Other Exercises: passing ball from R hand to L hand Other Exercises: handover hand assist to complete theraputty HEP with RUE assisting LUE Other Exercises: hand over hand assist to shoot ball into sink with RUE underneath LUE   Shoulder Instructions       General Comments      Pertinent Vitals/ Pain       Pain Assessment: No/denies pain  Home Living                                          Prior Functioning/Environment              Frequency  Min 2X/week        Progress Toward Goals  OT Goals(current goals can now be found in the care plan section)  Progress towards OT goals: Progressing toward goals  Acute Rehab OT Goals Patient Stated Goal: none stated today Time For Goal Achievement: 10/16/20 Potential to Achieve Goals: Fair  Plan Discharge plan remains appropriate;Frequency remains appropriate    Co-evaluation                 AM-PAC OT  "6 Clicks" Daily Activity     Outcome Measure   Help from another person eating meals?: A Little Help from another person taking care of personal grooming?: A Little Help from another person toileting, which includes using toliet, bedpan, or urinal?: A Lot Help from another person bathing (including washing, rinsing, drying)?: A Lot Help from another person to put on and taking off regular upper body clothing?: A Little Help from another person to put on and taking off regular lower body clothing?: A Lot 6 Click Score: 15    End of Session Equipment Utilized During Treatment: Gait belt  OT Visit Diagnosis: Unsteadiness on feet (R26.81);Other abnormalities of gait and mobility (R26.89);Muscle weakness (generalized) (M62.81);Hemiplegia and hemiparesis Hemiplegia - Right/Left: Left Hemiplegia - dominant/non-dominant: Non-Dominant Hemiplegia - caused by: Cerebral infarction   Activity Tolerance Patient tolerated treatment well   Patient Left in bed;with call bell/phone within reach;with bed alarm set;Other (comment) (SLP entering)   Nurse Communication Mobility status  Time: 7902-4097 OT Time Calculation (min): 43 min  Charges: OT General Charges $OT Visit: 1 Visit OT Treatments $Self Care/Home Management : 23-37 mins $Therapeutic Activity: 8-22 mins  Lenor Derrick., COTA/L Acute Rehabilitation Services [phone]    Barron Schmid 10/12/2020, 11:41 AM

## 2020-10-13 DIAGNOSIS — I255 Ischemic cardiomyopathy: Secondary | ICD-10-CM

## 2020-10-13 DIAGNOSIS — I63412 Cerebral infarction due to embolism of left middle cerebral artery: Secondary | ICD-10-CM

## 2020-10-13 LAB — CBC
HCT: 26.4 % — ABNORMAL LOW (ref 39.0–52.0)
Hemoglobin: 8.9 g/dL — ABNORMAL LOW (ref 13.0–17.0)
MCH: 28.9 pg (ref 26.0–34.0)
MCHC: 33.7 g/dL (ref 30.0–36.0)
MCV: 85.7 fL (ref 80.0–100.0)
Platelets: 463 10*3/uL — ABNORMAL HIGH (ref 150–400)
RBC: 3.08 MIL/uL — ABNORMAL LOW (ref 4.22–5.81)
RDW: 12.7 % (ref 11.5–15.5)
WBC: 6.9 10*3/uL (ref 4.0–10.5)
nRBC: 0 % (ref 0.0–0.2)

## 2020-10-13 LAB — CULTURE, BLOOD (ROUTINE X 2)
Culture: NO GROWTH
Culture: NO GROWTH

## 2020-10-13 LAB — COMPREHENSIVE METABOLIC PANEL
ALT: 17 U/L (ref 0–44)
AST: 28 U/L (ref 15–41)
Albumin: 1.4 g/dL — ABNORMAL LOW (ref 3.5–5.0)
Alkaline Phosphatase: 51 U/L (ref 38–126)
Anion gap: 11 (ref 5–15)
BUN: 11 mg/dL (ref 6–20)
CO2: 28 mmol/L (ref 22–32)
Calcium: 7.8 mg/dL — ABNORMAL LOW (ref 8.9–10.3)
Chloride: 99 mmol/L (ref 98–111)
Creatinine, Ser: 0.89 mg/dL (ref 0.61–1.24)
GFR, Estimated: >60 mL/min (ref >60)
Glucose, Bld: 99 mg/dL (ref 70–99)
Potassium: 2.7 mmol/L — CL (ref 3.5–5.1)
Sodium: 138 mmol/L (ref 135–145)
Total Bilirubin: 0.4 mg/dL (ref 0.3–1.2)
Total Protein: 4.2 g/dL — ABNORMAL LOW (ref 6.5–8.1)

## 2020-10-13 LAB — POTASSIUM: Potassium: 3.1 mmol/L — ABNORMAL LOW (ref 3.5–5.1)

## 2020-10-13 LAB — MAGNESIUM: Magnesium: 1.6 mg/dL — ABNORMAL LOW (ref 1.7–2.4)

## 2020-10-13 MED ORDER — POTASSIUM CHLORIDE CRYS ER 20 MEQ PO TBCR
40.0000 meq | EXTENDED_RELEASE_TABLET | ORAL | Status: AC
  Administered 2020-10-13 (×2): 40 meq via ORAL
  Filled 2020-10-13 (×2): qty 2

## 2020-10-13 MED ORDER — ENOXAPARIN SODIUM 40 MG/0.4ML IJ SOSY
40.0000 mg | PREFILLED_SYRINGE | INTRAMUSCULAR | Status: DC
  Administered 2020-10-13 – 2020-11-01 (×20): 40 mg via SUBCUTANEOUS
  Filled 2020-10-13 (×20): qty 0.4

## 2020-10-13 MED ORDER — MAGNESIUM SULFATE 2 GM/50ML IV SOLN
2.0000 g | Freq: Once | INTRAVENOUS | Status: AC
  Administered 2020-10-13: 2 g via INTRAVENOUS
  Filled 2020-10-13: qty 50

## 2020-10-13 MED ORDER — POTASSIUM CHLORIDE CRYS ER 20 MEQ PO TBCR
40.0000 meq | EXTENDED_RELEASE_TABLET | Freq: Once | ORAL | Status: AC
  Administered 2020-10-13: 40 meq via ORAL
  Filled 2020-10-13: qty 2

## 2020-10-13 NOTE — Progress Notes (Signed)
Date and time results received: 10/13/20  (use smartphrase ".now" to insert current time)  Test: Potassium level Critical Value:2.7  Name of Provider Notified: Derry Lory ,MD  Orders Received? Or Actions Taken?: Orders Received - See Orders for details

## 2020-10-13 NOTE — Progress Notes (Signed)
STROKE TEAM PROGRESS NOTE   SUBJECTIVE (INTERVAL HISTORY) RN at the bedside. Pt no acute event overnight, still pending SNF. Left arm and leg strength continues to improve. K and Mg were low, gave supplement today.   OBJECTIVE Temp:  [97.5 F (36.4 C)-98.8 F (37.1 C)] 97.5 F (36.4 C) (04/30 1218) Pulse Rate:  [67-77] 73 (04/30 1218) Cardiac Rhythm: Normal sinus rhythm (04/30 0803) Resp:  [17-18] 18 (04/30 1218) BP: (116-141)/(68-93) 141/88 (04/30 1218) SpO2:  [99 %-100 %] 100 % (04/30 1218)  No results for input(s): GLUCAP in the last 168 hours. Recent Labs  Lab 10/07/20 0229 10/09/20 1424 10/13/20 0245 10/13/20 0745  NA 136 136 138  --   K 3.1* 3.5 2.7* 3.1*  CL 105 103 99  --   CO2 22 24 28   --   GLUCOSE 99 126* 99  --   BUN 11 10 11   --   CREATININE 1.01 0.98 0.89  --   CALCIUM 7.5* 7.6* 7.8*  --   MG  --  1.8  --  1.6*  PHOS  --  3.2  --   --    Recent Labs  Lab 10/13/20 0245  AST 28  ALT 17  ALKPHOS 51  BILITOT 0.4  PROT 4.2*  ALBUMIN 1.4*   Recent Labs  Lab 10/07/20 0229 10/13/20 0245  WBC 13.8* 6.9  HGB 9.5* 8.9*  HCT 28.7* 26.4*  MCV 87.2 85.7  PLT 244 463*   No results for input(s): CKTOTAL, CKMB, CKMBINDEX, TROPONINI in the last 168 hours. No results for input(s): LABPROT, INR in the last 72 hours. No results for input(s): COLORURINE, LABSPEC, PHURINE, GLUCOSEU, HGBUR, BILIRUBINUR, KETONESUR, PROTEINUR, UROBILINOGEN, NITRITE, LEUKOCYTESUR in the last 72 hours.  Invalid input(s): APPERANCEUR     Component Value Date/Time   CHOL 297 (H) 10/02/2020 0607   TRIG 62 10/02/2020 0607   HDL 73 10/02/2020 0607   CHOLHDL 4.1 10/02/2020 0607   VLDL 12 10/02/2020 0607   LDLCALC 212 (H) 10/02/2020 0607   Lab Results  Component Value Date   HGBA1C 5.3 10/02/2020      Component Value Date/Time   LABOPIA NONE DETECTED 10/05/2020 1321   COCAINSCRNUR NONE DETECTED 10/05/2020 1321   LABBENZ NONE DETECTED 10/05/2020 1321   AMPHETMU NONE DETECTED  10/05/2020 1321   THCU NONE DETECTED 10/05/2020 1321   LABBARB NONE DETECTED 10/05/2020 1321    No results for input(s): ETH in the last 168 hours.  I have personally reviewed the radiological images below and agree with the radiology interpretations.  MR BRAIN WO CONTRAST  Result Date: 10/02/2020 CLINICAL DATA:  Stroke follow-up.  Status post tPA. EXAM: MRI HEAD WITHOUT CONTRAST TECHNIQUE: Multiplanar, multiecho pulse sequences of the brain and surrounding structures were obtained without intravenous contrast. COMPARISON:  October 01, 2020 CT code stroke. FINDINGS: Brain: Acute infarcts in the anterior and posterior right frontal lobe involving cortex and subcortical white matter. Associated edema with sulcal effacement. No substantial midline shift. Mild curvilinear susceptibility artifact the regions of infarct likely represent petechial hemorrhage. No hydrocephalus. No mass lesion. 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 No extra-axial fluid collection. Basal cisterns are patent. Vascular: Focal susceptibility artifact within a right M2 branch in the region of the more posterior frontal lobe infarct, compatible with thrombus and known occlusion better characterized on recent CTA. Major arterial flow voids are maintained skull base. Skull and upper cervical spine: No focal marrow replacing lesion. Sinuses/Orbits: Clear sinuses.  Unremarkable orbits.  Other: Large right mastoid effusion. IMPRESSION: 1. Acute infarcts in the anterior and posterior right frontal lobe. Associated edema with sulcal effacement. Mild curvilinear susceptibility artifact the regions of infarct likely represent petechial hemorrhage. No mass occupying acute hemorrhage. 2. Focal susceptibility artifact within a right M2 MCA branch in the region of the more posterior frontal lobe infarct, compatible with thrombus and known occlusion better characterized on recent CTA. 3. Remote left thalamic and left cerebellar  lacunar infarcts. 4. Large right mastoid effusion. Electronically Signed   By: Feliberto Harts MD   On: 10/02/2020 16:38   DG CHEST PORT 1 VIEW  Result Date: 10/06/2020 CLINICAL DATA:  Fevers. EXAM: PORTABLE CHEST 1 VIEW COMPARISON:  10/04/2020 FINDINGS: Stable cardiac enlargement. No pleural effusion or edema. No airspace densities identified. Visualized osseous structures are notable for thoracic scoliosis. IMPRESSION: 1. No acute cardiopulmonary abnormalities. 2. Cardiac enlargement. Electronically Signed   By: Signa Kell M.D.   On: 10/06/2020 07:53   DG CHEST PORT 1 VIEW  Result Date: 10/04/2020 CLINICAL DATA:  Fever, stroke, left-sided weakness EXAM: PORTABLE CHEST 1 VIEW COMPARISON:  None. FINDINGS: No consolidation, features of edema, pneumothorax, or effusion. Pulmonary vascularity is normally distributed. The cardiomediastinal contours are unremarkable. No acute osseous or soft tissue abnormality. Telemetry leads overlie the chest. IMPRESSION: No acute cardiopulmonary abnormality. Electronically Signed   By: Kreg Shropshire M.D.   On: 10/04/2020 06:21   DG Abd Portable 1V  Result Date: 10/06/2020 CLINICAL DATA:  Ileus. EXAM: PORTABLE ABDOMEN - 1 VIEW COMPARISON:  None. FINDINGS: Air-filled loops of large and small bowel. The colon appears to be normal in caliber. Small bowel loops are mildly dilated. No free air, portal venous gas, or pneumatosis. There is a stool ball in the rectum. No other abnormalities. IMPRESSION: 1. Air-filled loops of large and small bowel. The small bowel appears to be mildly dilated, out of proportion of the colon. While ileus is possible, findings are concerning for early small bowel obstruction. 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  Stroke. EXAM: CT ANGIOGRAPHY CHEST  WITH CONTRAST TECHNIQUE: Multidetector CT imaging of the chest was performed using the standard protocol during bolus administration of intravenous contrast. Multiplanar CT image reconstructions and MIPs were obtained to evaluate the vascular anatomy. CONTRAST:  OMNIPAQUE IOHEXOL 350 MG/ML SOLN COMPARISON:  None. FINDINGS: Cardiovascular: Thoracic aorta is normal in caliber. Minimal aortic atherosclerosis. No dissection. Mild cardiomegaly. No pericardial effusion. No evidence of pulmonary vascular malformation. Normal normal pulmonary arterial or venous connection. Mediastinum/Nodes: No mediastinal or hilar adenopathy. Decompressed esophagus. Subcentimeter left thyroid nodule needs no further follow-up given size. Lungs/Pleura: No focal airspace disease. No pleural fluid. No pulmonary edema. No pulmonary nodule or mass Upper Abdomen: No acute or unexpected findings. Musculoskeletal: Dextroscoliosis of the thoracic spine with mild degenerative change. There are no acute or suspicious osseous abnormalities. Review of the MIP images confirms the above findings. IMPRESSION: 1. No pulmonary vascular malformation or acute intrathoracic abnormality. 2. Mild cardiomegaly. Aortic Atherosclerosis (ICD10-I70.0). Electronically Signed   By: Narda Rutherford M.D.   On: 10/09/2020 22:39   VAS [place] TRANSCRANIAL DOPPLER W BUBBLES  Result Date: 10/10/2020  Transcranial Doppler with Bubble Patient Name:  Harry [name]  Date of Exam:   10/09/2020 Medical Rec #: [id number]  Accession #:    [id number] Date of Birth: Feb 03, 1976           Patient Gender: M Patient Age:   41Y Exam Location:  [facility] Procedure:      VAS [place] TRANSCRANIAL Demetrios Isaacs Referring Phys: [id number] PRAMOD S SETHI --------------------------------------------------------------------------------  Indications: Stroke. Comparison Study: no prior Performing Technologist: Blanch Media RVS  Examination Guidelines: A complete evaluation includes  B-mode imaging, spectral Doppler, color Doppler, and power Doppler as needed of all accessible portions of each vessel. Bilateral testing is considered an integral part of a complete examination. Limited examinations for reoccurring indications may be performed as noted.  Summary:  A vascular evaluation was performed. The right middle cerebral artery was studied. An IV was inserted into the patient's right forearm . Verbal informed consent was obtained.  HITS heard at rest & valsalva: spencer grade 2 Positive TCD Bubble study ( Spencer grade 2 ) *See table(s) above for TCD measurements and observations.  Diagnosing physician: Delia Heady MD Electronically signed by Delia Heady MD on 10/10/2020 at 2:15:46 PM.    Final    ECHOCARDIOGRAM COMPLETE  Result Date: 10/02/2020    ECHOCARDIOGRAM REPORT   Patient Name:   Harry [name] Date of Exam: 10/02/2020 Medical Rec #:  [id number]         Height:       74.0 in Accession #:    [id number]        Weight:       131.4 lb Date of Birth:  [birth year]          BSA:          1.818 m Patient Age:    44 years          BP:           168/104 mmHg Patient Gender: M                 HR:           90 bpm. Exam Location:  Inpatient Procedure: 2D Echo Indications:    Stroke I63.9  History:        Patient has no prior history of Echocardiogram examinations.  Sonographer:    Thurman Coyer RDCS (AE) Referring Phys: [id number] Marvel Plan IMPRESSIONS  1. Left ventricular ejection fraction, by estimation, is 40 to 45%. The left ventricle has mildly decreased function. The left ventricle demonstrates global hypokinesis. There is mild concentric left ventricular hypertrophy. Left ventricular diastolic parameters are consistent with Grade I diastolic dysfunction (impaired relaxation). Elevated left atrial pressure.  2. Right ventricular systolic function is normal. The right ventricular size is normal.  3. The pericardial effusion is circumferential. There is no evidence of cardiac tamponade.   4. The mitral valve is normal in structure. Trivial mitral valve regurgitation. No evidence of mitral stenosis.  5. The aortic valve is normal in structure. Aortic valve regurgitation is trivial. No aortic stenosis is present.  6. Aortic dilatation noted. There is mild dilatation of the aortic root, measuring 44 mm. There is mild dilatation of the ascending aorta, measuring 42 mm.  7. The inferior vena cava is normal in size with greater than 50% respiratory variability, suggesting right atrial pressure of 3 mmHg. FINDINGS  Left Ventricle: Left ventricular ejection fraction, by estimation, is 40 to 45%. The left ventricle has mildly decreased function. The left ventricle demonstrates global hypokinesis. The left ventricular internal cavity size was normal in size. There is  mild concentric  left ventricular hypertrophy. Left ventricular diastolic parameters are consistent with Grade I diastolic dysfunction (impaired relaxation). Elevated left atrial pressure. Right Ventricle: The right ventricular size is normal. No increase in right ventricular wall thickness. Right ventricular systolic function is normal. Left Atrium: Left atrial size was normal in size. Right Atrium: Right atrial size was normal in size. Pericardium: Trivial pericardial effusion is present. The pericardial effusion is circumferential. There is no evidence of cardiac tamponade. Mitral Valve: The mitral valve is normal in structure. Trivial mitral valve regurgitation. No evidence of mitral valve stenosis. Tricuspid Valve: The tricuspid valve is normal in structure. Tricuspid valve regurgitation is not demonstrated. No evidence of tricuspid stenosis. Aortic Valve: The aortic valve is normal in structure. Aortic valve regurgitation is trivial. No aortic stenosis is present. Aortic valve mean gradient measures 3.0 mmHg. Aortic valve peak gradient measures 5.1 mmHg. Aortic valve area, by VTI measures 3.31 cm. Pulmonic Valve: The pulmonic valve was  normal in structure. Pulmonic valve regurgitation is not visualized. No evidence of pulmonic stenosis. Aorta: The aortic root is normal in size and structure and aortic dilatation noted. There is mild dilatation of the aortic root, measuring 44 mm. There is mild dilatation of the ascending aorta, measuring 42 mm. Venous: The inferior vena cava is normal in size with greater than 50% respiratory variability, suggesting right atrial pressure of 3 mmHg. IAS/Shunts: No atrial level shunt detected by color flow Doppler.  LEFT VENTRICLE PLAX 2D LVIDd:         4.60 cm  Diastology LVIDs:         3.40 cm  LV e' medial:   3.81 cm/s LV PW:         1.40 cm  LV E/e' medial: 21.4 LV IVS:        1.40 cm LVOT diam:     2.40 cm LV SV:         64 LV SV Index:   35 LVOT Area:     4.52 cm  RIGHT VENTRICLE TAPSE (M-mode): 2.2 cm LEFT ATRIUM           Index       RIGHT ATRIUM           Index LA diam:      2.00 cm 1.10 cm/m  RA Area:     16.00 cm LA Vol (A2C): 42.7 ml 23.49 ml/m RA Volume:   42.30 ml  23.27 ml/m LA Vol (A4C): 45.1 ml 24.81 ml/m  AORTIC VALVE AV Area (Vmax):    3.97 cm AV Area (Vmean):   3.91 cm AV Area (VTI):     3.31 cm AV Vmax:           113.00 cm/s AV Vmean:          82.900 cm/s AV VTI:            0.193 m AV Peak Grad:      5.1 mmHg AV Mean Grad:      3.0 mmHg LVOT Vmax:         99.20 cm/s LVOT Vmean:        71.700 cm/s LVOT VTI:          0.141 m LVOT/AV VTI ratio: 0.73  AORTA Ao Root diam: 4.40 cm Ao Asc diam:  3.90 cm MITRAL VALVE MV Area (PHT): 4.41 cm    SHUNTS MV Decel Time: 172 msec    Systemic VTI:  0.14 m MV E velocity: 81.50 cm/s  Systemic Diam:  2.40 cm MV A velocity: 85.30 cm/s MV E/A ratio:  0.96 Mihai Croitoru MD Electronically signed by Thurmon FairMihai Croitoru MD Signature Date/Time: 10/02/2020/3:08:26 PM    Final    ECHO TEE  Result Date: 10/08/2020    TRANSESOPHOGEAL ECHO REPORT   Patient Name:   Harry [name] Date of Exam: 10/08/2020 Medical Rec #:  [id number]         Height: Accession #:     [id number]        Weight:       131.4 lb Date of Birth:  06/02/1976          BSA:          1.526 m Patient Age:    44 years          BP:           164/86 mmHg Patient Gender: M                 HR:           91 bpm. Exam Location:  Inpatient Procedure: 2D Echo, Cardiac Doppler, Color Doppler and Saline Contrast Bubble            Study Indications:    Stroke  History:        Patient has prior history of Echocardiogram examinations, most                 recent 10/02/2020. Signs/Symptoms:Murmur; Risk                 Factors:Dyslipidemia and Hypertension.  Sonographer:    Ross LudwigArthur Guy RDCS (AE) Referring Phys: [id number] Corrin ParkerALLIE E GOODRICH PROCEDURE: After discussion of the risks and benefits of a TEE, an informed consent was obtained from the patient. The transesophogeal probe was passed without difficulty through the esophogus of the patient. Sedation performed by different physician. The patient was monitored while under deep sedation. Anesthestetic sedation was provided intravenously by Anesthesiology: 170.46mg  of Propofol. Image quality was good. The patient's vital signs; including heart rate, blood pressure, and oxygen saturation; remained stable throughout the procedure. The patient developed no complications during the procedure. IMPRESSIONS  1. Left ventricular ejection fraction, by estimation, is 35 to 40%. The left ventricle has moderately decreased function. The left ventricle demonstrates global hypokinesis.  2. Right ventricular systolic function is normal. The right ventricular size is normal.  3. Left atrial size was mildly dilated. No left atrial/left atrial appendage thrombus was detected.  4. The mitral valve is normal in structure. Trivial mitral valve regurgitation. No evidence of mitral stenosis.  5. The aortic valve is normal in structure. Aortic valve regurgitation is not visualized. No aortic stenosis is present.  6. The inferior vena cava is normal in size with greater than 50% respiratory variability,  suggesting right atrial pressure of 3 mmHg.  7. A small pericardial effusion is present. The pericardial effusion is circumferential.  8. Agitated saline contrast bubble study was positive with shunting observed within 1 cardiac cycles suggestive of interatrial shunt. Conclusion(s)/Recommendation(s): Normal biventricular function without evidence of hemodynamically significant valvular heart disease. Positive TEE for PFO with bidirectional shunting. FINDINGS  Left Ventricle: Left ventricular ejection fraction, by estimation, is 35 to 40%. The left ventricle has moderately decreased function. The left ventricle demonstrates global hypokinesis. The left ventricular internal cavity size was normal in size. There is no left ventricular hypertrophy. Right Ventricle: The right ventricular size is normal. No increase in right ventricular wall thickness. Right ventricular systolic function  is normal. Left Atrium: Left atrial size was mildly dilated. Spontaneous echo contrast was present in the left atrium and left atrial appendage. No left atrial/left atrial appendage thrombus was detected. Right Atrium: Right atrial size was normal in size. Pericardium: A small pericardial effusion is present. The pericardial effusion is circumferential. Mitral Valve: The mitral valve is normal in structure. Trivial mitral valve regurgitation. No evidence of mitral valve stenosis. Tricuspid Valve: The tricuspid valve is normal in structure. Tricuspid valve regurgitation is mild . No evidence of tricuspid stenosis. Aortic Valve: The aortic valve is normal in structure. Aortic valve regurgitation is not visualized. No aortic stenosis is present. Pulmonic Valve: The pulmonic valve was normal in structure. Pulmonic valve regurgitation is trivial. No evidence of pulmonic stenosis. Aorta: The aortic root is normal in size and structure. Venous: The inferior vena cava is normal in size with greater than 50% respiratory variability, suggesting  right atrial pressure of 3 mmHg. IAS/Shunts: No atrial level shunt detected by color flow Doppler. Agitated saline contrast was given intravenously to evaluate for intracardiac shunting. Agitated saline contrast bubble study was positive with shunting observed within 3-6 cardiac cycles suggestive of interatrial shunt. [name] MD Electronically signed by [name] MD Signature Date/Time: 10/08/2020/1:26:23 PM    Final    CT HEAD CODE STROKE WO CONTRAST  Result Date: 10/01/2020 CLINICAL DATA:  Code stroke. Acute neuro deficit. Rule out stroke. Left facial droop EXAM: CT HEAD WITHOUT CONTRAST TECHNIQUE: Contiguous axial images were obtained from the base of the skull through the vertex without intravenous contrast. COMPARISON:  None. FINDINGS: Brain: Ventricle size and cerebral volume normal. Well-defined hypodensity left thalamus compatible with chronic infarction. Negative for acute infarct, hemorrhage, mass Vascular: Negative for hyperdense vessel Skull: Negative Sinuses/Orbits: Mild mucosal edema paranasal sinuses. Large right mastoid effusion and middle ear effusion. Left mastoid sinus clear. Other: None ASPECTS ([place] Stroke Program Early CT Score) - Ganglionic level infarction (caudate, lentiform nuclei, internal capsule, insula, M1-M3 cortex): 7 - Supraganglionic infarction (M4-M6 cortex): 3 Total score (0-10 with 10 being normal): 10 IMPRESSION: 1. No acute intracranial abnormality 2. ASPECTS is 10 3. Code stroke imaging results were communicated on 10/01/2020 at 6:19 pm to provider Roda Shutters via text page Electronically Signed   By: Marlan Palau M.D.   On: 10/01/2020 18:20   VAS [place] LOWER EXTREMITY VENOUS (DVT)  Result Date: 10/03/2020  Lower Venous DVT Study Indications: Stroke.  Comparison Study: no prior Performing Technologist: Blanch Media RVS  Examination Guidelines: A complete evaluation includes B-mode imaging, spectral Doppler, color Doppler, and power Doppler as needed of all accessible  portions of each vessel. Bilateral testing is considered an integral part of a complete examination. Limited examinations for reoccurring indications may be performed as noted. The reflux portion of the exam is performed with the patient in reverse Trendelenburg.  +---------+---------------+---------+-----------+----------+--------------+ RIGHT    CompressibilityPhasicitySpontaneityPropertiesThrombus Aging +---------+---------------+---------+-----------+----------+--------------+ CFV      Full           Yes      Yes                                 +---------+---------------+---------+-----------+----------+--------------+ SFJ      Full                                                        +---------+---------------+---------+-----------+----------+--------------+  FV Prox  Full                                                        +---------+---------------+---------+-----------+----------+--------------+ FV Mid   Full                                                        +---------+---------------+---------+-----------+----------+--------------+ FV DistalFull                                                        +---------+---------------+---------+-----------+----------+--------------+ PFV      Full                                                        +---------+---------------+---------+-----------+----------+--------------+ POP      Full           Yes      Yes                                 +---------+---------------+---------+-----------+----------+--------------+ PTV      Full                                                        +---------+---------------+---------+-----------+----------+--------------+ PERO     Full                                                        +---------+---------------+---------+-----------+----------+--------------+   +---------+---------------+---------+-----------+----------+--------------+ LEFT      CompressibilityPhasicitySpontaneityPropertiesThrombus Aging +---------+---------------+---------+-----------+----------+--------------+ CFV      Full           Yes      Yes                                 +---------+---------------+---------+-----------+----------+--------------+ SFJ      Full                                                        +---------+---------------+---------+-----------+----------+--------------+ FV Prox  Full                                                        +---------+---------------+---------+-----------+----------+--------------+  FV Mid   Full                                                        +---------+---------------+---------+-----------+----------+--------------+ FV DistalFull                                                        +---------+---------------+---------+-----------+----------+--------------+ PFV      Full                                                        +---------+---------------+---------+-----------+----------+--------------+ POP      Full           Yes      Yes                                 +---------+---------------+---------+-----------+----------+--------------+ PTV      Full                                                        +---------+---------------+---------+-----------+----------+--------------+ PERO     Full                                                        +---------+---------------+---------+-----------+----------+--------------+     Summary: BILATERAL: - No evidence of deep vein thrombosis seen in the lower extremities, bilaterally. - No evidence of superficial venous thrombosis in the lower extremities, bilaterally. -No evidence of popliteal cyst, bilaterally.   *See table(s) above for measurements and observations. Electronically signed by Heath Lark on 10/03/2020 at 4:38:55 PM.    Final    CT ANGIO HEAD NECK W WO CM (CODE STROKE)  Addendum Date:  10/01/2020   ADDENDUM REPORT: 10/01/2020 20:03 ADDENDUM: After further discussion with Dr. 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 Electronically Signed   By: Marlan Palau M.D.   On: 10/01/2020 20:03   Result Date: 10/01/2020 CLINICAL DATA:  Left facial droop EXAM: CT HEAD WITHOUT CONTRAST CT ANGIOGRAPHY HEAD AND NECK TECHNIQUE: Multidetector CT imaging of the head and neck was performed using the standard protocol during bolus administration of intravenous contrast. Multiplanar CT image reconstructions and MIPs were obtained to evaluate the vascular anatomy. Carotid stenosis measurements (when applicable) are obtained utilizing NASCET criteria, using the distal internal carotid diameter as the denominator. CONTRAST:  75mL OMNIPAQUE IOHEXOL 350 MG/ML SOLN COMPARISON:  CT head 10/01/2020 FINDINGS: CTA NECK FINDINGS Aortic arch: Standard branching. Imaged portion shows no evidence of aneurysm or dissection. No significant stenosis of the major arch vessel origins. Right carotid system: Normal right carotid. Negative  for atherosclerotic disease or stenosis. Left carotid system: Normal left carotid. Negative for atherosclerotic disease or stenosis. Vertebral arteries: Both vertebral arteries widely patent without stenosis. Skeleton: Cervicothoracic scoliosis. No acute skeletal abnormality. Multiple caries. Periapical lucency around right lower molar. Other neck: No mass or adenopathy. 12 mm left thyroid nodule. No further imaging necessary. Upper chest: Lung apices clear bilaterally. Review of the MIP images confirms the above findings CTA HEAD FINDINGS Anterior circulation: Anterior and middle cerebral arteries normal bilaterally. No stenosis or large vessel occlusion. Posterior circulation: Normal posterior circulation. No stenosis or large vessel occlusion. Venous sinuses: Normal venous enhancement Anatomic variants: None Delayed phase: Not performed Review of the MIP images  confirms the above findings IMPRESSION: Normal CT angiogram head and neck. No significant atherosclerotic disease. No stenosis or intracranial large vessel occlusion. Electronically Signed: By: Marlan Palau M.D. On: 10/01/2020 19:16    PHYSICAL EXAM  Temp:  [97.5 F (36.4 C)-98.8 F (37.1 C)] 97.5 F (36.4 C) (04/30 1218) Pulse Rate:  [67-77] 73 (04/30 1218) Resp:  [17-18] 18 (04/30 1218) BP: (116-141)/(68-93) 141/88 (04/30 1218) SpO2:  [99 %-100 %] 100 % (04/30 1218)  General -frail middle-age African-American male not in distress.  Ophthalmologic - fundi not visualized due to noncooperation.  Cardiovascular - Regular rhythm and rate.  Neuro - awake alert and oriented  to age, place, time and people. No aphasia, mild dysarthria but fluent language, following all simple commands. Able to name and repeat.  Mild dysarthria.  no gaze palsy now, but still more right gaze preference, visual field full no hemianopia, PERRL.  Left facial droop. Tongue protrusion to the left.  Right upper and lower extremity at least 4/5, left upper extremity deltoid and bicep 3-/5  , tricep and finger movement 0/5, left lower extremity 3/5 proximal and distal PF/DF 1/5.  Sensation decreased on the left. Right finger-to-nose intact.  Gait not tested.    ASSESSMENT/PLAN Harry [name] is a 45 y.o. male with PMH of heart murmur not on medications presented to ER for acute onset left arm and leg weakness, left visual and sensory neglect, right gaze preference, left facial droop nausea/vomiting. tPA given.  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  Discussed with patient regarding risk and benefit of thrombectomy, patient declined thrombectomy.     Stroke: Right MCA infarct, embolic pattern, source unclear but likely cardioembolic (see below)  CT head early ischemic changes right MCA but no hypodensity  CTA head neck was done showed right M2/M3 occlusion.   MRI right moderate MCA  cortical infarcts   2D Echo EF 40 to 45%  LE venous Doppler no DVT  TEE EF 35-40%, no clot but LA appendage with SEC and reduced LAA emptying velocity. positive for PFO  TCD bubble study minmal (Spencer degree 2 ) PFO  LE venous Doppler negative for DVT  LDL 212  HgbA1c 5.3  UDS negative  Lovenox for VTE prophylaxis  No antithrombotic prior to admission, now on aspirin 81mg  daily and clopidogrel 75 mg daily DAPT for 3 months and then aspirin alone given LVO intracranially.  Patient counseled to be compliant with his antithrombotic medications  Ongoing aggressive stroke risk factor management  Therapy recommendations: SNF   Disposition: SNF  Cardiomyopathy  EF 40 to 45%  TEE EF 35-40% with LAA SEC and reduced emptying velocity  Cardiology Dr. Jacinto Halim on board  CTA chest no pulmonary AVM  outpt follow up with Dr. Jacinto Halim  ??  PFO  TEE positive for bilateral shunting suggestive of PFO  TCD bubble study minmal (Spencer degree 2 ) PFO  CTA chest no pulmonary AVM  Follow up with Dr. Jacinto Halim as outpt  Hypertensive urgency . Needed labetalol and Cleviprex IV before IV tPA for BP control . Stable on the high end . Off Cleviprex . On amlodipine 10   Long term BP goal normotensive  Hyperlipidemia  Home meds: None  LDL 212, goal < 70  Now on Lipitor 80  Continue statin at discharge  Dysphagia  Mild dysarthria  On dysphagia 2 and thin liquid  intermittent cough improving  Other Stroke Risk Factors  Hx of heart murmur  Klebsiella UTI  Leukocytosis WBC 10.9-> 8.1->7.4->6.5->10.4->13.8->6.9  Keflex 500mg  po q 12 hours x 5-7 days   Other Active Problems  Hypokalemia K 2.7->3.1 supplement  MG 1.6 -> supplement  Hospital day # 12  , MD PhD Stroke Neurology 10/13/2020 5:03 PM   To contact Stroke Continuity provider, please refer to 10/15/2020. After hours, contact General Neurology

## 2020-10-13 NOTE — Progress Notes (Signed)
Brief Update:  Potassium low at 2.7. I ordered KCL PO every hour x 2 doses. Will get repeat K at 0800 in AM. Will add Magnesium level for 0800 today too.  Erick Blinks [facility] Pager Number [phone]

## 2020-10-13 NOTE — Plan of Care (Signed)

## 2020-10-13 NOTE — Plan of Care (Signed)
  Problem: Education: Goal: Knowledge of General Education information will improve Description: Including pain rating scale, medication(s)/side effects and non-pharmacologic comfort measures 10/13/2020 1811 by Coralie Common, RN Outcome: Progressing 10/13/2020 1811 by Coralie Common, RN Outcome: Progressing   Problem: Health Behavior/Discharge Planning: Goal: Ability to manage health-related needs will improve 10/13/2020 1811 by Coralie Common, RN Outcome: Progressing 10/13/2020 1811 by Coralie Common, RN Outcome: Progressing   Problem: Clinical Measurements: Goal: Ability to maintain clinical measurements within normal limits will improve 10/13/2020 1811 by Coralie Common, RN Outcome: Progressing 10/13/2020 1811 by Coralie Common, RN Outcome: Progressing Goal: Will remain free from infection 10/13/2020 1811 by Coralie Common, RN Outcome: Progressing 10/13/2020 1811 by Coralie Common, RN Outcome: Progressing Goal: Diagnostic test results will improve 10/13/2020 1811 by Coralie Common, RN Outcome: Progressing 10/13/2020 1811 by Coralie Common, RN Outcome: Progressing Goal: Respiratory complications will improve 10/13/2020 1811 by Coralie Common, RN Outcome: Progressing 10/13/2020 1811 by Coralie Common, RN Outcome: Progressing Goal: Cardiovascular complication will be avoided 10/13/2020 1811 by Coralie Common, RN Outcome: Progressing 10/13/2020 1811 by Coralie Common, RN Outcome: Progressing

## 2020-10-14 NOTE — Progress Notes (Signed)
STROKE TEAM PROGRESS NOTE   SUBJECTIVE (INTERVAL HISTORY) RN at the bedside. Pt no acute event overnight, still pending SNF. Left arm and leg strength continues to improve.  Encourage self exercise in bed.   OBJECTIVE Temp:  [98.4 F (36.9 C)-99.4 F (37.4 C)] 98.5 F (36.9 C) (05/01 1524) Pulse Rate:  [73-86] 78 (05/01 1524) Cardiac Rhythm: Normal sinus rhythm;Heart block (05/01 0700) Resp:  [17-20] 18 (05/01 1524) BP: (112-146)/(67-93) 112/67 (05/01 1524) SpO2:  [97 %-100 %] 100 % (05/01 1524)  No results for input(s): GLUCAP in the last 168 hours. Recent Labs  Lab 10/09/20 1424 10/13/20 0245 10/13/20 0745  NA 136 138  --   K 3.5 2.7* 3.1*  CL 103 99  --   CO2 24 28  --   GLUCOSE 126* 99  --   BUN 10 11  --   CREATININE 0.98 0.89  --   CALCIUM 7.6* 7.8*  --   MG 1.8  --  1.6*  PHOS 3.2  --   --    Recent Labs  Lab 10/13/20 0245  AST 28  ALT 17  ALKPHOS 51  BILITOT 0.4  PROT 4.2*  ALBUMIN 1.4*   Recent Labs  Lab 10/13/20 0245  WBC 6.9  HGB 8.9*  HCT 26.4*  MCV 85.7  PLT 463*   No results for input(s): CKTOTAL, CKMB, CKMBINDEX, TROPONINI in the last 168 hours. No results for input(s): LABPROT, INR in the last 72 hours. No results for input(s): COLORURINE, LABSPEC, PHURINE, GLUCOSEU, HGBUR, BILIRUBINUR, KETONESUR, PROTEINUR, UROBILINOGEN, NITRITE, LEUKOCYTESUR in the last 72 hours.  Invalid input(s): APPERANCEUR     Component Value Date/Time   CHOL 297 (H) 10/02/2020 0607   TRIG 62 10/02/2020 0607   HDL 73 10/02/2020 0607   CHOLHDL 4.1 10/02/2020 0607   VLDL 12 10/02/2020 0607   LDLCALC 212 (H) 10/02/2020 0607   Lab Results  Component Value Date   HGBA1C 5.3 10/02/2020      Component Value Date/Time   LABOPIA NONE DETECTED 10/05/2020 1321   COCAINSCRNUR NONE DETECTED 10/05/2020 1321   LABBENZ NONE DETECTED 10/05/2020 1321   AMPHETMU NONE DETECTED 10/05/2020 1321   THCU NONE DETECTED 10/05/2020 1321   LABBARB NONE DETECTED 10/05/2020 1321     No results for input(s): ETH in the last 168 hours.  I have personally reviewed the radiological images below and agree with the radiology interpretations.  MR BRAIN WO CONTRAST  Result Date: 10/02/2020 CLINICAL DATA:  Stroke follow-up.  Status post tPA. EXAM: MRI HEAD WITHOUT CONTRAST TECHNIQUE: Multiplanar, multiecho pulse sequences of the brain and surrounding structures were obtained without intravenous contrast. COMPARISON:  October 01, 2020 CT code stroke. FINDINGS: Brain: Acute infarcts in the anterior and posterior right frontal lobe involving cortex and subcortical white matter. Associated edema with sulcal effacement. No substantial midline shift. Mild curvilinear susceptibility artifact the regions of infarct likely represent petechial hemorrhage. No hydrocephalus. No mass lesion. 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 No extra-axial fluid collection. Basal cisterns are patent. Vascular: Focal susceptibility artifact within a right M2 branch in the region of the more posterior frontal lobe infarct, compatible with thrombus and known occlusion better characterized on recent CTA. Major arterial flow voids are maintained skull base. Skull and upper cervical spine: No focal marrow replacing lesion. Sinuses/Orbits: Clear sinuses.  Unremarkable orbits. Other: Large right mastoid effusion. IMPRESSION: 1. Acute infarcts in the anterior and posterior right frontal lobe. Associated edema with sulcal effacement. Mild curvilinear  susceptibility artifact the regions of infarct likely represent petechial hemorrhage. No mass occupying acute hemorrhage. 2. Focal susceptibility artifact within a right M2 MCA branch in the region of the more posterior frontal lobe infarct, compatible with thrombus and known occlusion better characterized on recent CTA. 3. 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 4. Large right mastoid effusion. Electronically Signed   By: Feliberto HartsFrederick S Jones MD    On: 10/02/2020 16:38   DG CHEST PORT 1 VIEW  Result Date: 10/06/2020 CLINICAL DATA:  Fevers. EXAM: PORTABLE CHEST 1 VIEW COMPARISON:  10/04/2020 FINDINGS: Stable cardiac enlargement. No pleural effusion or edema. No airspace densities identified. Visualized osseous structures are notable for thoracic scoliosis. IMPRESSION: 1. No acute cardiopulmonary abnormalities. 2. Cardiac enlargement. Electronically Signed   By: Signa Kellaylor  Stroud M.D.   On: 10/06/2020 07:53   DG CHEST PORT 1 VIEW  Result Date: 10/04/2020 CLINICAL DATA:  Fever, stroke, left-sided weakness EXAM: PORTABLE CHEST 1 VIEW COMPARISON:  None. FINDINGS: No consolidation, features of edema, pneumothorax, or effusion. Pulmonary vascularity is normally distributed. The cardiomediastinal contours are unremarkable. No acute osseous or soft tissue abnormality. Telemetry leads overlie the chest. IMPRESSION: No acute cardiopulmonary abnormality. Electronically Signed   By: Kreg ShropshirePrice  DeHay M.D.   On: 10/04/2020 06:21   DG Abd Portable 1V  Result Date: 10/06/2020 CLINICAL DATA:  Ileus. EXAM: PORTABLE ABDOMEN - 1 VIEW COMPARISON:  None. FINDINGS: Air-filled loops of large and small bowel. The colon appears to be normal in caliber. Small bowel loops are mildly dilated. No free air, portal venous gas, or pneumatosis. There is a stool ball in the rectum. No other abnormalities. IMPRESSION: 1. Air-filled loops of large and small bowel. The small bowel appears to be mildly dilated, out of proportion of the colon. While ileus is possible, findings are concerning for early small bowel obstruction. 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  Stroke. EXAM: CT ANGIOGRAPHY CHEST WITH CONTRAST TECHNIQUE: Multidetector CT imaging of the chest was performed using the standard protocol  during bolus administration of intravenous contrast. Multiplanar CT image reconstructions and MIPs were obtained to evaluate the vascular anatomy. CONTRAST:  100mL OMNIPAQUE IOHEXOL 350 MG/ML SOLN COMPARISON:  None. FINDINGS: Cardiovascular: Thoracic aorta is normal in caliber. Minimal aortic atherosclerosis. No dissection. Mild cardiomegaly. No pericardial effusion. No evidence of pulmonary vascular malformation. Normal normal pulmonary arterial or venous connection. Mediastinum/Nodes: No mediastinal or hilar adenopathy. Decompressed esophagus. Subcentimeter left thyroid nodule needs no further follow-up given size. Lungs/Pleura: No focal airspace disease. No pleural fluid. No pulmonary edema. No pulmonary nodule or mass Upper Abdomen: No acute or unexpected findings. Musculoskeletal: Dextroscoliosis of the thoracic spine with mild degenerative change. There are no acute or suspicious osseous abnormalities. Review of the MIP images confirms the above findings. IMPRESSION: 1. No pulmonary vascular malformation or acute intrathoracic abnormality. 2. Mild cardiomegaly. Aortic Atherosclerosis (ICD10-I70.0). Electronically Signed   By: Narda RutherfordMelanie  Sanford M.D.   On: 10/09/2020 22:39   VAS US TRANSCRANIAL DOPPLER W BUBBLES  Result Date: 10/10/2020  Transcranial Doppler with Bubble Patient Name:  Bosie HelperLANORRIS P Mote  Date of Exam:   10/09/2020 Medical Rec #: [id number]          Accession #:    [id number] Date of Birth: 11/07/1975  Patient Gender: M Patient Age:   35Y Exam Location:  [facility] Procedure:      VAS [place] TRANSCRANIAL Demetrios Isaacs Referring Phys: [id number] PRAMOD S SETHI --------------------------------------------------------------------------------  Indications: Stroke. Comparison Study: no prior Performing Technologist: Blanch Media RVS  Examination Guidelines: A complete evaluation includes B-mode imaging, spectral Doppler, color Doppler, and power Doppler as needed of all accessible portions  of each vessel. Bilateral testing is considered an integral part of a complete examination. Limited examinations for reoccurring indications may be performed as noted.  Summary:  A vascular evaluation was performed. The right middle cerebral artery was studied. An IV was inserted into the patient's right forearm . Verbal informed consent was obtained.  HITS heard at rest & valsalva: spencer grade 2 Positive TCD Bubble study ( Spencer grade 2 ) *See table(s) above for TCD measurements and observations.  Diagnosing physician: Delia Heady MD Electronically signed by Delia Heady MD on 10/10/2020 at 2:15:46 PM.    Final    ECHOCARDIOGRAM COMPLETE  Result Date: 10/02/2020    ECHOCARDIOGRAM REPORT   Patient Name:   ELION HOCKER Date of Exam: 10/02/2020 Medical Rec #:  [id number]         Height:       74.0 in Accession #:    [id number]        Weight:       131.4 lb Date of Birth:  [birth year]          BSA:          1.818 m Patient Age:    44 years          BP:           168/104 mmHg Patient Gender: M                 HR:           90 bpm. Exam Location:  Inpatient Procedure: 2D Echo Indications:    Stroke I63.9  History:        Patient has no prior history of Echocardiogram examinations.  Sonographer:    Thurman Coyer RDCS (AE) Referring Phys: [id number] Marvel Plan IMPRESSIONS  1. Left ventricular ejection fraction, by estimation, is 40 to 45%. The left ventricle has mildly decreased function. The left ventricle demonstrates global hypokinesis. There is mild concentric left ventricular hypertrophy. Left ventricular diastolic parameters are consistent with Grade I diastolic dysfunction (impaired relaxation). Elevated left atrial pressure.  2. Right ventricular systolic function is normal. The right ventricular size is normal.  3. The pericardial effusion is circumferential. There is no evidence of cardiac tamponade.  4. The mitral valve is normal in structure. Trivial mitral valve regurgitation. No evidence of mitral  stenosis.  5. The aortic valve is normal in structure. Aortic valve regurgitation is trivial. No aortic stenosis is present.  6. Aortic dilatation noted. There is mild dilatation of the aortic root, measuring 44 mm. There is mild dilatation of the ascending aorta, measuring 42 mm.  7. The inferior vena cava is normal in size with greater than 50% respiratory variability, suggesting right atrial pressure of 3 mmHg. FINDINGS  Left Ventricle: Left ventricular ejection fraction, by estimation, is 40 to 45%. The left ventricle has mildly decreased function. The left ventricle demonstrates global hypokinesis. The left ventricular internal cavity size was normal in size. There is  mild concentric left ventricular hypertrophy. Left ventricular diastolic parameters are consistent with Grade I diastolic dysfunction (impaired relaxation). Elevated left atrial pressure.  Right Ventricle: The right ventricular size is normal. No increase in right ventricular wall thickness. Right ventricular systolic function is normal. Left Atrium: Left atrial size was normal in size. Right Atrium: Right atrial size was normal in size. Pericardium: Trivial pericardial effusion is present. The pericardial effusion is circumferential. There is no evidence of cardiac tamponade. Mitral Valve: The mitral valve is normal in structure. Trivial mitral valve regurgitation. No evidence of mitral valve stenosis. Tricuspid Valve: The tricuspid valve is normal in structure. Tricuspid valve regurgitation is not demonstrated. No evidence of tricuspid stenosis. Aortic Valve: The aortic valve is normal in structure. Aortic valve regurgitation is trivial. No aortic stenosis is present. Aortic valve mean gradient measures 3.0 mmHg. Aortic valve peak gradient measures 5.1 mmHg. Aortic valve area, by VTI measures 3.31 cm. Pulmonic Valve: The pulmonic valve was normal in structure. Pulmonic valve regurgitation is not visualized. No evidence of pulmonic stenosis.  Aorta: The aortic root is normal in size and structure and aortic dilatation noted. There is mild dilatation of the aortic root, measuring 44 mm. There is mild dilatation of the ascending aorta, measuring 42 mm. Venous: The inferior vena cava is normal in size with greater than 50% respiratory variability, suggesting right atrial pressure of 3 mmHg. IAS/Shunts: No atrial level shunt detected by color flow Doppler.  LEFT VENTRICLE PLAX 2D LVIDd:         4.60 cm  Diastology LVIDs:         3.40 cm  LV e' medial:   3.81 cm/s LV PW:         1.40 cm  LV E/e' medial: 21.4 LV IVS:        1.40 cm LVOT diam:     2.40 cm LV SV:         64 LV SV Index:   35 LVOT Area:     4.52 cm  RIGHT VENTRICLE TAPSE (M-mode): 2.2 cm LEFT ATRIUM           Index       RIGHT ATRIUM           Index LA diam:      2.00 cm 1.10 cm/m  RA Area:     16.00 cm LA Vol (A2C): 42.7 ml 23.49 ml/m RA Volume:   42.30 ml  23.27 ml/m LA Vol (A4C): 45.1 ml 24.81 ml/m  AORTIC VALVE AV Area (Vmax):    3.97 cm AV Area (Vmean):   3.91 cm AV Area (VTI):     3.31 cm AV Vmax:           113.00 cm/s AV Vmean:          82.900 cm/s AV VTI:            0.193 m AV Peak Grad:      5.1 mmHg AV Mean Grad:      3.0 mmHg LVOT Vmax:         99.20 cm/s LVOT Vmean:        71.700 cm/s LVOT VTI:          0.141 m LVOT/AV VTI ratio: 0.73  AORTA Ao Root diam: 4.40 cm Ao Asc diam:  3.90 cm MITRAL VALVE MV Area (PHT): 4.41 cm    SHUNTS MV Decel Time: 172 msec    Systemic VTI:  0.14 m MV E velocity: 81.50 cm/s  Systemic Diam: 2.40 cm MV A velocity: 85.30 cm/s MV E/A ratio:  0.96 Mihai Croitoru MD Electronically signed by Thurmon Fair  MD Signature Date/Time: 10/02/2020/3:08:26 PM    Final    ECHO TEE  Result Date: 10/08/2020    TRANSESOPHOGEAL ECHO REPORT   Patient Name:   YEHIA MCBAIN Date of Exam: 10/08/2020 Medical Rec #:  [id number]         Height: Accession #:    [id number]        Weight:       131.4 lb Date of Birth:  [birth year]          BSA:          1.526 m Patient  Age:    44 years          BP:           164/86 mmHg Patient Gender: M                 HR:           91 bpm. Exam Location:  Inpatient Procedure: 2D Echo, Cardiac Doppler, Color Doppler and Saline Contrast Bubble            Study Indications:    Stroke  History:        Patient has prior history of Echocardiogram examinations, most                 recent 10/02/2020. Signs/Symptoms:Murmur; Risk                 Factors:Dyslipidemia and Hypertension.  Sonographer:    Ross Ludwig RDCS (AE) Referring Phys: [id number] Corrin Parker PROCEDURE: After discussion of the risks and benefits of a TEE, an informed consent was obtained from the patient. The transesophogeal probe was passed without difficulty through the esophogus of the patient. Sedation performed by different physician. The patient was monitored while under deep sedation. Anesthestetic sedation was provided intravenously by Anesthesiology: 170.46mg  of Propofol. Image quality was good. The patient's vital signs; including heart rate, blood pressure, and oxygen saturation; remained stable throughout the procedure. The patient developed no complications during the procedure. IMPRESSIONS  1. Left ventricular ejection fraction, by estimation, is 35 to 40%. The left ventricle has moderately decreased function. The left ventricle demonstrates global hypokinesis.  2. Right ventricular systolic function is normal. The right ventricular size is normal.  3. Left atrial size was mildly dilated. No left atrial/left atrial appendage thrombus was detected.  4. The mitral valve is normal in structure. Trivial mitral valve regurgitation. No evidence of mitral stenosis.  5. The aortic valve is normal in structure. Aortic valve regurgitation is not visualized. No aortic stenosis is present.  6. The inferior vena cava is normal in size with greater than 50% respiratory variability, suggesting right atrial pressure of 3 mmHg.  7. A small pericardial effusion is present. The pericardial  effusion is circumferential.  8. Agitated saline contrast bubble study was positive with shunting observed within 1 cardiac cycles suggestive of interatrial shunt. Conclusion(s)/Recommendation(s): Normal biventricular function without evidence of hemodynamically significant valvular heart disease. Positive TEE for PFO with bidirectional shunting. FINDINGS  Left Ventricle: Left ventricular ejection fraction, by estimation, is 35 to 40%. The left ventricle has moderately decreased function. The left ventricle demonstrates global hypokinesis. The left ventricular internal cavity size was normal in size. There is no left ventricular hypertrophy. Right Ventricle: The right ventricular size is normal. No increase in right ventricular wall thickness. Right ventricular systolic function is normal. Left Atrium: Left atrial size was mildly dilated. Spontaneous echo contrast was present in the left atrium and  left atrial appendage. No left atrial/left atrial appendage thrombus was detected. Right Atrium: Right atrial size was normal in size. Pericardium: A small pericardial effusion is present. The pericardial effusion is circumferential. Mitral Valve: The mitral valve is normal in structure. Trivial mitral valve regurgitation. No evidence of mitral valve stenosis. Tricuspid Valve: The tricuspid valve is normal in structure. Tricuspid valve regurgitation is mild . No evidence of tricuspid stenosis. Aortic Valve: The aortic valve is normal in structure. Aortic valve regurgitation is not visualized. No aortic stenosis is present. Pulmonic Valve: The pulmonic valve was normal in structure. Pulmonic valve regurgitation is trivial. No evidence of pulmonic stenosis. Aorta: The aortic root is normal in size and structure. Venous: The inferior vena cava is normal in size with greater than 50% respiratory variability, suggesting right atrial pressure of 3 mmHg. IAS/Shunts: No atrial level shunt detected by color flow Doppler. Agitated  saline contrast was given intravenously to evaluate for intracardiac shunting. Agitated saline contrast bubble study was positive with shunting observed within 3-6 cardiac cycles suggestive of interatrial shunt. [name] MD Electronically signed by [name] MD Signature Date/Time: 10/08/2020/1:26:23 PM    Final    CT HEAD CODE STROKE WO CONTRAST  Result Date: 10/01/2020 CLINICAL DATA:  Code stroke. Acute neuro deficit. Rule out stroke. Left facial droop EXAM: CT HEAD WITHOUT CONTRAST TECHNIQUE: Contiguous axial images were obtained from the base of the skull through the vertex without intravenous contrast. COMPARISON:  None. FINDINGS: Brain: Ventricle size and cerebral volume normal. Well-defined hypodensity left thalamus compatible with chronic infarction. Negative for acute infarct, hemorrhage, mass Vascular: Negative for hyperdense vessel Skull: Negative Sinuses/Orbits: Mild mucosal edema paranasal sinuses. Large right mastoid effusion and middle ear effusion. Left mastoid sinus clear. Other: None ASPECTS ([place] Stroke Program Early CT Score) - Ganglionic level infarction (caudate, lentiform nuclei, internal capsule, insula, M1-M3 cortex): 7 - Supraganglionic infarction (M4-M6 cortex): 3 Total score (0-10 with 10 being normal): 10 IMPRESSION: 1. No acute intracranial abnormality 2. ASPECTS is 10 3. Code stroke imaging results were communicated on 10/01/2020 at 6:19 pm to provider Roda Shutters via text page Electronically Signed   By: Marlan Palau M.D.   On: 10/01/2020 18:20   VAS [place] LOWER EXTREMITY VENOUS (DVT)  Result Date: 10/03/2020  Lower Venous DVT Study Indications: Stroke.  Comparison Study: no prior Performing Technologist: Blanch Media RVS  Examination Guidelines: A complete evaluation includes B-mode imaging, spectral Doppler, color Doppler, and power Doppler as needed of all accessible portions of each vessel. Bilateral testing is considered an integral part of a complete examination. Limited  examinations for reoccurring indications may be performed as noted. The reflux portion of the exam is performed with the patient in reverse Trendelenburg.  +---------+---------------+---------+-----------+----------+--------------+ RIGHT    CompressibilityPhasicitySpontaneityPropertiesThrombus Aging +---------+---------------+---------+-----------+----------+--------------+ CFV      Full           Yes      Yes                                 +---------+---------------+---------+-----------+----------+--------------+ SFJ      Full                                                        +---------+---------------+---------+-----------+----------+--------------+ FV Prox  Full                                                        +---------+---------------+---------+-----------+----------+--------------+  FV Mid   Full                                                        +---------+---------------+---------+-----------+----------+--------------+ FV DistalFull                                                        +---------+---------------+---------+-----------+----------+--------------+ PFV      Full                                                        +---------+---------------+---------+-----------+----------+--------------+ POP      Full           Yes      Yes                                 +---------+---------------+---------+-----------+----------+--------------+ PTV      Full                                                        +---------+---------------+---------+-----------+----------+--------------+ PERO     Full                                                        +---------+---------------+---------+-----------+----------+--------------+   +---------+---------------+---------+-----------+----------+--------------+ LEFT     CompressibilityPhasicitySpontaneityPropertiesThrombus Aging  +---------+---------------+---------+-----------+----------+--------------+ CFV      Full           Yes      Yes                                 +---------+---------------+---------+-----------+----------+--------------+ SFJ      Full                                                        +---------+---------------+---------+-----------+----------+--------------+ FV Prox  Full                                                        +---------+---------------+---------+-----------+----------+--------------+ FV Mid   Full                                                        +---------+---------------+---------+-----------+----------+--------------+   FV DistalFull                                                        +---------+---------------+---------+-----------+----------+--------------+ PFV      Full                                                        +---------+---------------+---------+-----------+----------+--------------+ POP      Full           Yes      Yes                                 +---------+---------------+---------+-----------+----------+--------------+ PTV      Full                                                        +---------+---------------+---------+-----------+----------+--------------+ PERO     Full                                                        +---------+---------------+---------+-----------+----------+--------------+     Summary: BILATERAL: - No evidence of deep vein thrombosis seen in the lower extremities, bilaterally. - No evidence of superficial venous thrombosis in the lower extremities, bilaterally. -No evidence of popliteal cyst, bilaterally.   *See table(s) above for measurements and observations. Electronically signed by Heath Lark on 10/03/2020 at 4:38:55 PM.    Final    CT ANGIO HEAD NECK W WO CM (CODE STROKE)  Addendum Date: 10/01/2020   ADDENDUM REPORT: 10/01/2020 20:03 ADDENDUM: After further  discussion with Dr. 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 Electronically Signed   By: Marlan Palau M.D.   On: 10/01/2020 20:03   Result Date: 10/01/2020 CLINICAL DATA:  Left facial droop EXAM: CT HEAD WITHOUT CONTRAST CT ANGIOGRAPHY HEAD AND NECK TECHNIQUE: Multidetector CT imaging of the head and neck was performed using the standard protocol during bolus administration of intravenous contrast. Multiplanar CT image reconstructions and MIPs were obtained to evaluate the vascular anatomy. Carotid stenosis measurements (when applicable) are obtained utilizing NASCET criteria, using the distal internal carotid diameter as the denominator. CONTRAST:  33mL OMNIPAQUE IOHEXOL 350 MG/ML SOLN COMPARISON:  CT head 10/01/2020 FINDINGS: CTA NECK FINDINGS Aortic arch: Standard branching. Imaged portion shows no evidence of aneurysm or dissection. No significant stenosis of the major arch vessel origins. Right carotid system: Normal right carotid. Negative for atherosclerotic disease or stenosis. Left carotid system: Normal left carotid. Negative for atherosclerotic disease or stenosis. Vertebral arteries: Both vertebral arteries widely patent without stenosis. Skeleton: Cervicothoracic scoliosis. No acute skeletal abnormality. Multiple caries. Periapical lucency around right lower molar. Other neck: No mass or adenopathy. 12 mm left thyroid nodule. No further imaging necessary. Upper chest: Lung apices clear  bilaterally. Review of the MIP images confirms the above findings CTA HEAD FINDINGS Anterior circulation: Anterior and middle cerebral arteries normal bilaterally. No stenosis or large vessel occlusion. Posterior circulation: Normal posterior circulation. No stenosis or large vessel occlusion. Venous sinuses: Normal venous enhancement Anatomic variants: None Delayed phase: Not performed Review of the MIP images confirms the above findings IMPRESSION: Normal CT angiogram head and  neck. No significant atherosclerotic disease. No stenosis or intracranial large vessel occlusion. Electronically Signed: By: Marlan Palau M.D. On: 10/01/2020 19:16    PHYSICAL EXAM  Temp:  [98.4 F (36.9 C)-99.4 F (37.4 C)] 98.5 F (36.9 C) (05/01 1524) Pulse Rate:  [73-86] 78 (05/01 1524) Resp:  [17-20] 18 (05/01 1524) BP: (112-146)/(67-93) 112/67 (05/01 1524) SpO2:  [97 %-100 %] 100 % (05/01 1524)  General -frail middle-age African-American male not in distress.  Ophthalmologic - fundi not visualized due to noncooperation.  Cardiovascular - Regular rhythm and rate.  Neuro - awake alert and oriented  to age, place, time and people. No aphasia, mild dysarthria but fluent language, following all simple commands. Able to name and repeat.  Mild dysarthria.  no gaze palsy now, but still more right gaze preference, visual field full no hemianopia, PERRL.  Left facial droop. Tongue protrusion to the left.  Right upper and lower extremity at least 4/5, left upper extremity deltoid and bicep 3-/5  , tricep and finger movement 0/5, left lower extremity 3/5 proximal and distal PF/DF 1/5.  Sensation decreased on the left. Right finger-to-nose intact.  Gait not tested.    ASSESSMENT/PLAN Mr. TORIS LAVERDIERE is a 45 y.o. male with PMH of heart murmur not on medications presented to ER for acute onset left arm and leg weakness, left visual and sensory neglect, right gaze preference, left facial droop nausea/vomiting. tPA given.  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  Discussed with patient regarding risk and benefit of thrombectomy, patient declined thrombectomy.     Stroke: Right MCA infarct, embolic pattern, source unclear but likely cardioembolic (see below)  CT head early ischemic changes right MCA but no hypodensity  CTA head neck was done showed right M2/M3 occlusion.   MRI right moderate MCA cortical infarcts   2D Echo EF 40 to 45%  LE venous Doppler no  DVT  TEE EF 35-40%, no clot but LA appendage with SEC and reduced LAA emptying velocity. positive for PFO  TCD bubble study minmal (Spencer degree 2 ) PFO  LE venous Doppler negative for DVT  LDL 212  HgbA1c 5.3  UDS negative  Lovenox for VTE prophylaxis  No antithrombotic prior to admission, now on aspirin 81mg  daily and clopidogrel 75 mg daily DAPT for 3 months and then aspirin alone given LVO intracranially.  Patient counseled to be compliant with his antithrombotic medications  Ongoing aggressive stroke risk factor management  Therapy recommendations: SNF   Disposition: SNF  Cardiomyopathy  EF 40 to 45%  TEE EF 35-40% with LAA SEC and reduced emptying velocity  Cardiology Dr. Jacinto Halim on board  CTA chest no pulmonary AVM  outpt follow up with Dr. Jacinto Halim  ?? PFO  TEE positive for bilateral shunting suggestive of PFO  TCD bubble study minmal (Spencer degree 2 ) PFO  CTA chest no pulmonary AVM  Follow up with Dr. Jacinto Halim as outpt  Hypertensive urgency . Needed labetalol and Cleviprex IV before IV tPA for BP control . Stable on the high end . Off Cleviprex . On amlodipine  10   Long term BP goal normotensive  Hyperlipidemia  Home meds: None  LDL 212, goal < 70  Now on Lipitor 80  Continue statin at discharge  Dysphagia  Mild dysarthria  On dysphagia 2 and thin liquid  intermittent cough improving  Other Stroke Risk Factors  Hx of heart murmur  Klebsiella UTI  Leukocytosis WBC 10.9-> 8.1->7.4->6.5->10.4->13.8->6.9  Keflex 500mg  po q 12 hours x 5-7 days   Other Active Problems  Hypokalemia K 2.7->3.1 -> supplement  MG 1.6 -> supplement  Hospital day # 13  , MD PhD Stroke Neurology 10/14/2020 7:59 PM    To contact Stroke Continuity provider, please refer to 12/14/2020. After hours, contact General Neurology

## 2020-10-14 NOTE — Plan of Care (Signed)
  Problem: Education: Goal: Knowledge of General Education information will improve Description: Including pain rating scale, medication(s)/side effects and non-pharmacologic comfort measures Outcome: Progressing   Problem: Health Behavior/Discharge Planning: Goal: Ability to manage health-related needs will improve Outcome: Progressing   Problem: Clinical Measurements: Goal: Ability to maintain clinical measurements within normal limits will improve Outcome: Progressing Goal: Will remain free from infection Outcome: Progressing Goal: Diagnostic test results will improve Outcome: Progressing Goal: Respiratory complications will improve Outcome: Progressing Goal: Cardiovascular complication will be avoided Outcome: Progressing   Problem: Education: Goal: Knowledge of disease or condition will improve Outcome: Progressing Goal: Knowledge of secondary prevention will improve Outcome: Progressing Goal: Knowledge of patient specific risk factors addressed and post discharge goals established will improve Outcome: Progressing Goal: Individualized Educational Video(s) Outcome: Progressing   Problem: Skin Integrity: Goal: Risk for impaired skin integrity will decrease Outcome: Progressing   Problem: Safety: Goal: Ability to remain free from injury will improve Outcome: Progressing   Problem: Coping: Goal: Level of anxiety will decrease Outcome: Progressing

## 2020-10-15 LAB — CBC
HCT: 26.6 % — ABNORMAL LOW (ref 39.0–52.0)
Hemoglobin: 9.1 g/dL — ABNORMAL LOW (ref 13.0–17.0)
MCH: 29.1 pg (ref 26.0–34.0)
MCHC: 34.2 g/dL (ref 30.0–36.0)
MCV: 85 fL (ref 80.0–100.0)
Platelets: 476 10*3/uL — ABNORMAL HIGH (ref 150–400)
RBC: 3.13 MIL/uL — ABNORMAL LOW (ref 4.22–5.81)
RDW: 12.7 % (ref 11.5–15.5)
WBC: 7.3 10*3/uL (ref 4.0–10.5)
nRBC: 0 % (ref 0.0–0.2)

## 2020-10-15 LAB — BASIC METABOLIC PANEL
Anion gap: 11 (ref 5–15)
BUN: 13 mg/dL (ref 6–20)
CO2: 28 mmol/L (ref 22–32)
Calcium: 8.1 mg/dL — ABNORMAL LOW (ref 8.9–10.3)
Chloride: 101 mmol/L (ref 98–111)
Creatinine, Ser: 0.76 mg/dL (ref 0.61–1.24)
GFR, Estimated: >60 mL/min (ref >60)
Glucose, Bld: 99 mg/dL (ref 70–99)
Potassium: 3.3 mmol/L — ABNORMAL LOW (ref 3.5–5.1)
Sodium: 140 mmol/L (ref 135–145)

## 2020-10-15 LAB — MAGNESIUM: Magnesium: 1.7 mg/dL (ref 1.7–2.4)

## 2020-10-15 MED ORDER — POTASSIUM CHLORIDE CRYS ER 20 MEQ PO TBCR
40.0000 meq | EXTENDED_RELEASE_TABLET | ORAL | Status: AC
  Administered 2020-10-15 (×2): 40 meq via ORAL
  Filled 2020-10-15 (×2): qty 2

## 2020-10-15 MED ORDER — MAGNESIUM SULFATE 2 GM/50ML IV SOLN
2.0000 g | Freq: Once | INTRAVENOUS | Status: AC
  Administered 2020-10-15: 2 g via INTRAVENOUS
  Filled 2020-10-15: qty 50

## 2020-10-15 NOTE — Progress Notes (Signed)
Physical Therapy Treatment Patient Details Name: Harry [name] MRN: [id number] DOB: 02/13/1976 Today's Date: 10/15/2020    History of Present Illness 45 yo male presenting to ED with left sided weakness. CT showing early ischemic changes around right MCA but no hypodensity. tPA given on 4/18 @1823 . 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 Awaiting MRI. PMH including heart murmur not on medications.    PT Comments    Pt in bed on entry with c/o of tiredness on entry. Agreeable to work with therapy however noted to be slower in movement and less willing to try to move his L UE instead using his R UE for mobility. Pt is min A for bed mobility, modAx2 for transfers with 3 Musketeer technique and minAx2 with B UE pull on sink. Pt maxAx2 for ambulation from bed to chair at the door. Due to fatigue pt and chair scooted to in front of sink to work on standing balance and weight shift with emphasis on attaining L UE terminal knee extension for stability. Pt noted to be mentally and physically fatigued, but encouraged ambulation 4 feet back to bed from sink. Pt requires increased maxAx2 for return to bed. Pt is making progress towards goals, however discussed alternating morning sessions between PT and OT to work on ambulation when pt is less fatigued. D/c plans remain appropriate. PT will continue to follow acutely.     Follow Up Recommendations  SNF     Equipment Recommendations  3in1 (PT);Wheelchair cushion (measurements PT);Wheelchair (measurements PT)       Precautions / Restrictions Precautions Precautions: Fall;Other (comment) Precaution Comments: LUE hemiparesis, subluxing LUE Restrictions Weight Bearing Restrictions: No    Mobility  Bed Mobility Overal bed mobility: Needs Assistance Bed Mobility: Supine to Sit     Supine to sit: Min assist Sit to supine: Min assist   General bed mobility comments: pt able to come to upright, but does not attend to L arm and  then utilizes R UE to move around in front of him, then requires min A for pad scoot to EoB as he struggles to advance L LE towards EoB    Transfers Overall transfer level: Needs assistance Equipment used: Rolling walker (2 wheeled) Transfers: Sit to/from Stand Sit to Stand: Mod assist;+2 physical assistance;Min assist        General transfer comment: modAx2 for 3 Musketeers standing from bed and straightback chair, min Ax2 for 2x sit>stand from chair in front of sink, requires increased cuing for attention to L UE and LE movement to optimal position for powerup, then he only requires minAx2 for steadying once in standing  Ambulation/Gait Ambulation/Gait assistance: Max assist;+2 physical assistance Gait Distance (Feet): 12 Feet (1x12', 1x4) Assistive device: 2 person hand held assist (3 Musketeers assist x2) Gait Pattern/deviations: Step-through pattern;Narrow base of support;Decreased weight shift to left;Decreased stance time - left;Decreased step length - right Gait velocity: decr Gait velocity interpretation: <1.31 ft/sec, indicative of household ambulator General Gait Details: continue to work on proper L LE placement for stability prior to weight shift for movement of R LE, vc for L hip abduction and L knee extension to knee lock for advancement of R LE, pt with varying success with advancement and fatigues mental and physically    Modified Rankin (Stroke Patients Only) Modified Rankin (Stroke Patients Only) Pre-Morbid Rankin Score: No symptoms Modified Rankin: Moderately severe disability     Balance Overall balance assessment: Needs assistance Sitting-balance support: Feet supported;Single extremity supported Sitting  balance-Leahy Scale: Fair Sitting balance - Comments: close guard for safety statically, requires light assist dynamically   Standing balance support: Bilateral upper extremity supported;During functional activity Standing balance-Leahy Scale:  Poor Standing balance comment: min A for maintaining balance with B UE support at the sink                            Cognition Arousal/Alertness: Awake/alert Behavior During Therapy: Flat affect Overall Cognitive Status: Impaired/Different from baseline Area of Impairment: Following commands;Safety/judgement;Awareness;Attention;Problem solving                   Current Attention Level: Sustained Memory: Decreased short-term memory (repeated cues for BUE placement) Following Commands: Follows one step commands with increased time;Follows multi-step commands inconsistently Safety/Judgement: Decreased awareness of safety Awareness: Emergent Problem Solving: Slow processing;Difficulty sequencing;Requires verbal cues General Comments: pt reports increased tiredness today, agreeable to work but fatigues easily requiring rest breaks      Exercises General Exercises - Upper Extremity Shoulder Flexion: Left;Seated;Self ROM;10 reps Elbow Flexion: Left;10 reps;Seated;AAROM Elbow Extension: AROM;AAROM;Left;10 reps;Seated General Exercises - Lower Extremity Long Arc Quad: AAROM;Left;10 reps;AROM;Right;Seated Hip ABduction/ADduction: Strengthening;Left;10 reps;Right;Seated Hip Flexion/Marching: AAROM;Left;10 reps;AROM;Right;Seated Other Exercises Other Exercises: weightshifting in standing Other Exercises: bicep curls with 1 lb dowel rod cobanded to pts L hand Other Exercises: dynamic reaching to engage in therapeutic activity of playing tic-tac-toe and hang man with tech while COTA provided support at LLE and trunk for balance    General Comments General comments (skin integrity, edema, etc.): VSS      Pertinent Vitals/Pain Pain Assessment: No/denies pain Faces Pain Scale: No hurt           PT Goals (current goals can now be found in the care plan section) Acute Rehab PT Goals Patient Stated Goal: none stated today PT Goal Formulation: With patient/family Time  For Goal Achievement: 10/16/20 Potential to Achieve Goals: Good Progress towards PT goals: Progressing toward goals    Frequency    Min 5X/week      PT Plan Current plan remains appropriate       AM-PAC PT "6 Clicks" Mobility   Outcome Measure  Help needed turning from your back to your side while in a flat bed without using bedrails?: A Lot Help needed moving from lying on your back to sitting on the side of a flat bed without using bedrails?: A Lot Help needed moving to and from a bed to a chair (including a wheelchair)?: A Lot Help needed standing up from a chair using your arms (e.g., wheelchair or bedside chair)?: A Lot Help needed to walk in hospital room?: A Lot Help needed climbing 3-5 steps with a railing? : Total 6 Click Score: 11    End of Session Equipment Utilized During Treatment: Gait belt Activity Tolerance: Patient limited by fatigue Patient left: with call bell/phone within reach;in chair;with chair alarm set Nurse Communication: Mobility status PT Visit Diagnosis: Unsteadiness on feet (R26.81);Other abnormalities of gait and mobility (R26.89);Difficulty in walking, not elsewhere classified (R26.2);Hemiplegia and hemiparesis Hemiplegia - Right/Left: Left Hemiplegia - dominant/non-dominant: Non-dominant Hemiplegia - caused by: Cerebral infarction     Time: 8366-2947 PT Time Calculation (min) (ACUTE ONLY): 42 min  Charges:  $Gait Training: 8-22 mins $Therapeutic Exercise: 8-22 mins                     Milik Gilreath B. Beverely Risen PT, DPT Acute Rehabilitation Services Pager [phone] Office [phone]  [phone]    Elon Alas Fleet 10/15/2020, 4:00 PM

## 2020-10-15 NOTE — Progress Notes (Addendum)
STROKE TEAM PROGRESS NOTE   SUBJECTIVE (INTERVAL HISTORY) PT finishing session, reporting slight improvement. He is awaiting SNF placement in a difficult to place scenario due to funding.  Today he reports he is doing well without new problems or concerns. He is wishing he was making enough to progress to go home. He reports tolerating diet.  We discussed his plan of care including plan for dc to SNF. He had no questions today.   OBJECTIVE Temp:  [97.9 F (36.6 C)-99.4 F (37.4 C)] 97.9 F (36.6 C) (05/02 1158) Pulse Rate:  [73-86] 74 (05/02 1158) Cardiac Rhythm: Bundle branch block;Heart block (05/02 0907) Resp:  [17-20] 20 (05/02 1158) BP: (112-157)/(67-99) 155/87 (05/02 1158) SpO2:  [99 %-100 %] 100 % (05/02 1158)  No results for input(s): GLUCAP in the last 168 hours. Recent Labs  Lab 10/09/20 1424 10/13/20 0245 10/13/20 0745 10/15/20 0426  NA 136 138  --  140  K 3.5 2.7* 3.1* 3.3*  CL 103 99  --  101  CO2 24 28  --  28  GLUCOSE 126* 99  --  99  BUN 10 11  --  13  CREATININE 0.98 0.89  --  0.76  CALCIUM 7.6* 7.8*  --  8.1*  MG 1.8  --  1.6* 1.7  PHOS 3.2  --   --   --    Recent Labs  Lab 10/13/20 0245  AST 28  ALT 17  ALKPHOS 51  BILITOT 0.4  PROT 4.2*  ALBUMIN 1.4*   Recent Labs  Lab 10/13/20 0245 10/15/20 0426  WBC 6.9 7.3  HGB 8.9* 9.1*  HCT 26.4* 26.6*  MCV 85.7 85.0  PLT 463* 476*        Component Value Date/Time   CHOL 297 (H) 10/02/2020 0607   TRIG 62 10/02/2020 0607   HDL 73 10/02/2020 0607   CHOLHDL 4.1 10/02/2020 0607   VLDL 12 10/02/2020 0607   LDLCALC 212 (H) 10/02/2020 0607   Lab Results  Component Value Date   HGBA1C 5.3 10/02/2020      Component Value Date/Time   LABOPIA NONE DETECTED 10/05/2020 1321   COCAINSCRNUR NONE DETECTED 10/05/2020 1321   LABBENZ NONE DETECTED 10/05/2020 1321   AMPHETMU NONE DETECTED 10/05/2020 1321   THCU NONE DETECTED 10/05/2020 1321   LABBARB NONE DETECTED 10/05/2020 1321    No results for  input(s): ETH in the last 168 hours.  MR Brain 1. Acute infarcts in the anterior and posterior right frontal lobe. Associated edema with sulcal effacement. Mild curvilinear susceptibility artifact the regions of infarct likely represent petechial hemorrhage. No mass occupying acute hemorrhage. 2. Focal susceptibility artifact within a right M2 MCA branch in the region of the more posterior frontal lobe infarct, compatible with thrombus and known occlusion better characterized on recent CTA. 3. 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 4. Large right mastoid effusion  CTA 1. No pulmonary vascular malformation or acute intrathoracic abnormality. 2. Mild cardiomegaly  CT head 1. Acute infarcts in the anterior and posterior right frontal lobe. Associated edema with sulcal effacement. Mild curvilinear susceptibility artifact the regions of infarct likely represent petechial hemorrhage. No mass occupying acute hemorrhage. 2. Focal susceptibility artifact within a right M2 MCA branch in the region of the more posterior frontal lobe infarct, compatible with thrombus and known occlusion better characterized on recent CTA. 3. 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 4. Large right mastoid effusion  PHYSICAL EXAM  Temp:  [97.9 F (36.6  C)-99.4 F (37.4 C)] 97.9 F (36.6 C) (05/02 1158) Pulse Rate:  [73-86] 74 (05/02 1158) Resp:  [17-20] 20 (05/02 1158) BP: (112-157)/(67-99) 155/87 (05/02 1158) SpO2:  [99 %-100 %] 100 % (05/02 1158)  General -frail middle-age African-American male not in distress.  Ophthalmologic - fundi not visualized due to noncooperation.  Cardiovascular - Regular rhythm and rate.  Neuro - awake alert and oriented  to age, place, time and people. No aphasia, mild dysarthria but fluent language, following all simple commands. Able to name and repeat.  Mild dysarthria.  no gaze palsy now, but still more right gaze preference,  visual field full no hemianopia, PERRL.  Left facial droop. Tongue protrusion to the left.  Right upper and lower extremity at least 4/5, left upper extremity deltoid and bicep 3-/5  , tricep and finger movement 0/5, left lower extremity 3/5 proximal and distal PF/DF 1/5.  Sensation decreased on the left. Right finger-to-nose intact.  Gait not tested.    ASSESSMENT/PLAN [name] is a 45 y.o. male with PMH of heart murmur not on medications presented to ER for acute onset left arm and leg weakness, left visual and sensory neglect, right gaze preference, left facial droop nausea/vomiting. tPA given.  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  Discussed with patient regarding risk and benefit of thrombectomy, patient declined thrombectomy.     Stroke: Right MCA infarct, embolic pattern, source unclear but likely cardioembolic (see below)  CT head early ischemic changes right MCA but no hypodensity  CTA head neck was done showed right M2/M3 occlusion.   MRI right moderate MCA cortical infarcts   2D Echo EF 40 to 45%  LE venous Doppler no DVT  TEE EF 35-40%, no clot but LA appendage with SEC and reduced LAA emptying velocity. positive for PFO  TCD bubble study minmal (Spencer degree 2 ) PFO  LE venous Doppler negative for DVT  LDL 212  HgbA1c 5.3  UDS negative  Lovenox for VTE prophylaxis  No antithrombotic prior to admission, now on aspirin 81mg  daily and clopidogrel 75 mg daily DAPT for 3 months and then aspirin alone given LVO intracranially.  Patient counseled to be compliant with his antithrombotic medications  Ongoing aggressive stroke risk factor management  Therapy recommendations: SNF   Disposition: SNF  Cardiomyopathy  EF 40 to 45%  TEE EF 35-40% with LAA SEC and reduced emptying velocity  Cardiology Dr. on board  CTA chest no pulmonary AVM  outpt follow up with Dr. Jacinto Halim  ?? PFO  TEE positive for bilateral shunting  suggestive of PFO  TCD bubble study minmal (Spencer degree 2 ) PFO  CTA chest no pulmonary AVM  Follow up with Dr. Jacinto Halim as outpt  Hypertensive urgency . Needed labetalol and Cleviprex IV before IV tPA for BP control . Stable on the high end . Off Cleviprex . On amlodipine 10   Long term BP goal normotensive  Hyperlipidemia  Home meds: None  LDL 212, goal < 70  Now on Lipitor 80  Continue statin at discharge  Dysphagia  Mild dysarthria  On dysphagia 2 and thin liquid  intermittent cough improving  Other Stroke Risk Factors  Hx of heart murmur  Klebsiella UTI  Leukocytosis WBC 10.9-> 8.1->7.4->6.5->10.4->13.8->6.9->7.3  Keflex 500mg  po q 12 hours x 5-7 days   Other Active Problems  Hypokalemia K 2.7->3.1 ->3.3, supplement  MG 1.6 ->1.7, supplement  Hospital day # 14  Delila A Bailey-Modzik,  NP-C This plan of care was directed by Dr. Roda Shutters.   ATTENDING NOTE: I reviewed above note and agree with the assessment and plan. Pt was seen and examined.   No acute event overnight.  Patient working with PT, continue to progress slowly.  Pending SNF placement.  Potassium 3.3, magnesium 1.7, give supplement for both.  We will check again on Wednesday.  For detailed assessment and plan, please refer to above as I have made changes wherever appropriate.   Marvel Plan, MD PhD Stroke Neurology 10/15/2020 5:14 PM     To contact Stroke Continuity provider, please refer to [email or web address]. After hours, contact General Neurology

## 2020-10-15 NOTE — Progress Notes (Signed)
Occupational Therapy Treatment Patient Details Name: Harry [name] MRN: [id number] DOB: August 08, 1975 Today's Date: 10/15/2020    History of present illness 45 yo male presenting to ED with left sided weakness. CT showing early ischemic changes around right MCA but no hypodensity. tPA given on 4/18 @1823 . 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 Awaiting MRI. PMH including heart murmur not on medications.   OT comments  Pt making steady progress towards OT goals this session. Session focus on  Bilateral integration tasks, standing balance/ tolerance and LUE coordination and AROM. Pt currently requires MIN A - min guard +1 for lateral scoot transfer into recliner and MIN A +2 for sit<>stand from recliner. Pt additionally engaged in seated bilateral integration tasks where COTA used coban to secure L hand to dowel rod with pt able to complete chest press, bicep curls as well as tapping ball back and forth with tech seated up in recliner. Pt able to stand at mirror with MIN A +1 to engage in therapeutic activities such as "hangman" and "tic-tac-toe" ~ 5 mins total. Additionally worked on passing squeeze ball back and forth from R<>L hand from bed level. Pt would continue to benefit from skilled occupational therapy while admitted and after d/c to address the below listed limitations in order to improve overall functional mobility and facilitate independence with BADL participation. DC plan remains appropriate, will follow acutely per POC.     Follow Up Recommendations  SNF    Equipment Recommendations  Other (comment) (defer to next venue of care)    Recommendations for Other Services      Precautions / Restrictions Precautions Precautions: Fall;Other (comment) Precaution Comments: LUE hemiparesis, subluxing LUE Restrictions Weight Bearing Restrictions: No       Mobility Bed Mobility Overal bed mobility: Needs Assistance Bed Mobility: Supine to Sit;Sit to Supine      Supine to sit: Min guard Sit to supine: Min guard   General bed mobility comments: min guard and increased time to transition from supine<>sitting, increased cues needed to recall compensatory method of elevating BUEs back to bed    Transfers Overall transfer level: Needs assistance Equipment used: Rolling walker (2 wheeled) Transfers: Sit to/from Stand;Lateral/Scoot Transfers Sit to Stand: Min assist;+2 physical assistance        Lateral/Scoot Transfers: Min assist;Min guard General transfer comment: MIN A +1 to scoot into recliner going to pts L side, minguard +1 to scoot into recliner going to pts R side, MIN A +2 sit<>stand from recliner with RW    Balance Overall balance assessment: Needs assistance Sitting-balance support: Feet supported;Single extremity supported Sitting balance-Leahy Scale: Fair     Standing balance support: No upper extremity supported;During functional activity Standing balance-Leahy Scale: Poor Standing balance comment: MIN A +1 for dynamic reaching tasks standing in front of mirror with COTA supporting pts L knee and providing support at trunk                           ADL either performed or assessed with clinical judgement   ADL Overall ADL's : Needs assistance/impaired                 Upper Body Dressing : Minimal assistance;Sitting Upper Body Dressing Details (indicate cue type and reason): pt able to place LUE in sleeve as gown using RUE as assist     Toilet Transfer: Minimal assistance Toilet Transfer Details (indicate cue type and reason): lateral scoot  transfer from EOB<>recliner with MIN A +1         Functional mobility during ADLs: Minimal assistance;+2 for physical assistance;+2 for safety/equipment (MIN A +1 for lateral scoot transfer from EOB<>recliner; MIN A +2 for sit<>stand) General ADL Comments: session focus on bilateral tasks, standing balance/ tolerance and LUE coordination and AROM     Vision        Perception     Praxis      Cognition Arousal/Alertness: Awake/alert Behavior During Therapy: Flat affect (mostly flat but moments of verbose conversation) Overall Cognitive Status: Impaired/Different from baseline Area of Impairment: Following commands;Safety/judgement;Awareness;Attention;Problem solving;Memory                   Current Attention Level: Sustained Memory: Decreased short-term memory (repeated cues for BUE placement) Following Commands: Follows multi-step commands inconsistently;Follows one step commands inconsistently;Follows one step commands with increased time Safety/Judgement: Decreased awareness of safety;Decreased awareness of deficits (pt reports his brother was going to get him walking down the hall this weekend) Awareness: Intellectual Problem Solving: Slow processing;Difficulty sequencing;Requires verbal cues General Comments: pt needed repeated cues for body mechanics during BUE activities and noted to be more distracted in new environment of neuro gym this session        Exercises General Exercises - Upper Extremity Shoulder Flexion: Left;Seated;Self ROM;10 reps Elbow Flexion: Left;10 reps;Seated;AAROM Elbow Extension: AROM;AAROM;Left;10 reps;Seated Other Exercises Other Exercises: chest press with 1 lb dowel rod cobanded to pts L hand ~ 3 mins Other Exercises: bicep curls with 1 lb dowel rod cobanded to pts L hand Other Exercises: dynamic reaching to engage in therapeutic activity of playing tic-tac-toe and hang man with tech while COTA provided support at LLE and trunk for balance   Shoulder Instructions       General Comments      Pertinent Vitals/ Pain       Pain Assessment: No/denies pain  Home Living                                          Prior Functioning/Environment              Frequency  Min 2X/week        Progress Toward Goals  OT Goals(current goals can now be found in the care plan  section)  Progress towards OT goals: Progressing toward goals  Acute Rehab OT Goals Patient Stated Goal: none stated today Time For Goal Achievement: 10/16/20 Potential to Achieve Goals: Fair  Plan Discharge plan remains appropriate;Frequency remains appropriate    Co-evaluation                 AM-PAC OT "6 Clicks" Daily Activity     Outcome Measure   Help from another person eating meals?: A Little Help from another person taking care of personal grooming?: A Little Help from another person toileting, which includes using toliet, bedpan, or urinal?: A Lot Help from another person bathing (including washing, rinsing, drying)?: A Lot Help from another person to put on and taking off regular upper body clothing?: A Little Help from another person to put on and taking off regular lower body clothing?: A Lot 6 Click Score: 15    End of Session Equipment Utilized During Treatment: Gait belt;Rolling walker  OT Visit Diagnosis: Unsteadiness on feet (R26.81);Other abnormalities of gait and mobility (R26.89);Muscle weakness (generalized) (M62.81);Hemiplegia and hemiparesis Hemiplegia - Right/Left:  Left Hemiplegia - dominant/non-dominant: Non-Dominant Hemiplegia - caused by: Cerebral infarction   Activity Tolerance Patient tolerated treatment well   Patient Left in bed;with call bell/phone within reach;with bed alarm set   Nurse Communication Mobility status        Time: 4765-4650 OT Time Calculation (min): 40 min  Charges: OT General Charges $OT Visit: 1 Visit OT Treatments $Therapeutic Activity: 38-52 mins  Lenor Derrick., COTA/L Acute Rehabilitation Services [phone]    Barron Schmid 10/15/2020, 2:26 PM

## 2020-10-16 MED ORDER — CITALOPRAM HYDROBROMIDE 10 MG PO TABS
20.0000 mg | ORAL_TABLET | Freq: Every day | ORAL | Status: DC
  Administered 2020-10-17 – 2020-11-02 (×17): 20 mg via ORAL
  Filled 2020-10-16 (×18): qty 2

## 2020-10-16 NOTE — Progress Notes (Signed)
Orthopedic Tech Progress Note Patient Details:  Harry [name] [birth year] [id number] Called in order to [facility] for a WRIST COCK UP SPLINT Patient ID: [name], male   DOB: 13-Apr-1976, 45 y.o.   MRN: [id number]   Donald Pore 10/16/2020, 2:32 PM

## 2020-10-16 NOTE — Progress Notes (Addendum)
STROKE TEAM PROGRESS NOTE   SUBJECTIVE (INTERVAL HISTORY)  He is awaiting SNF placement in a difficult to place scenario due to funding.  Today he reports he is doing well without new problems or concerns. He is feeding self spaghetti and eating with good appetite. He continues to state he wishes he could go home and stay with his brother.   PT reports patient is participating less and seems to be feeling down.   OBJECTIVE Temp:  [97.8 F (36.6 C)-98.5 F (36.9 C)] 98.5 F (36.9 C) (05/03 1326) Pulse Rate:  [75-79] 79 (05/03 1326) Cardiac Rhythm: Normal sinus rhythm (05/03 0810) Resp:  [14-20] 14 (05/03 1326) BP: (124-148)/(78-99) 124/78 (05/03 1326) SpO2:  [99 %-100 %] 100 % (05/03 1326)  No results for input(s): GLUCAP in the last 168 hours. Recent Labs  Lab 10/13/20 0245 10/13/20 0745 10/15/20 0426  NA 138  --  140  K 2.7* 3.1* 3.3*  CL 99  --  101  CO2 28  --  28  GLUCOSE 99  --  99  BUN 11  --  13  CREATININE 0.89  --  0.76  CALCIUM 7.8*  --  8.1*  MG  --  1.6* 1.7   Recent Labs  Lab 10/13/20 0245  AST 28  ALT 17  ALKPHOS 51  BILITOT 0.4  PROT 4.2*  ALBUMIN 1.4*   Recent Labs  Lab 10/13/20 0245 10/15/20 0426  WBC 6.9 7.3  HGB 8.9* 9.1*  HCT 26.4* 26.6*  MCV 85.7 85.0  PLT 463* 476*        Component Value Date/Time   CHOL 297 (H) 10/02/2020 0607   TRIG 62 10/02/2020 0607   HDL 73 10/02/2020 0607   CHOLHDL 4.1 10/02/2020 0607   VLDL 12 10/02/2020 0607   LDLCALC 212 (H) 10/02/2020 0607   Lab Results  Component Value Date   HGBA1C 5.3 10/02/2020      Component Value Date/Time   LABOPIA NONE DETECTED 10/05/2020 1321   COCAINSCRNUR NONE DETECTED 10/05/2020 1321   LABBENZ NONE DETECTED 10/05/2020 1321   AMPHETMU NONE DETECTED 10/05/2020 1321   THCU NONE DETECTED 10/05/2020 1321   LABBARB NONE DETECTED 10/05/2020 1321    No results for input(s): ETH in the last 168 hours.  MR Brain 1. Acute infarcts in the anterior and posterior right  frontal lobe. Associated edema with sulcal effacement. Mild curvilinear susceptibility artifact the regions of infarct likely represent petechial hemorrhage. No mass occupying acute hemorrhage. 2. Focal susceptibility artifact within a right M2 MCA branch in the region of the more posterior frontal lobe infarct, compatible with thrombus and known occlusion better characterized on recent CTA. 3. 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 4. Large right mastoid effusion  CTA 1. No pulmonary vascular malformation or acute intrathoracic abnormality. 2. Mild cardiomegaly  CT head 1. Acute infarcts in the anterior and posterior right frontal lobe. Associated edema with sulcal effacement. Mild curvilinear susceptibility artifact the regions of infarct likely represent petechial hemorrhage. No mass occupying acute hemorrhage. 2. Focal susceptibility artifact within a right M2 MCA branch in the region of the more posterior frontal lobe infarct, compatible with thrombus and known occlusion better characterized on recent CTA. 3. 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 4. Large right mastoid effusion  PHYSICAL EXAM  Temp:  [97.8 F (36.6 C)-98.5 F (36.9 C)] 98.5 F (36.9 C) (05/03 1326) Pulse Rate:  [75-79] 79 (05/03 1326) Resp:  [14-20] 14 (05/03 1326) BP: (  124-148)/(78-99) 124/78 (05/03 1326) SpO2:  [99 %-100 %] 100 % (05/03 1326)  General -frail middle-age African-American male not in distress Resp: No extra work of breathing Cardiovascular - Regular rhythm and rate.  Neuro - awake alert and oriented  to age, place, time and people. No aphasia, mild dysarthria but fluent language, following all simple commands. Able to name and repeat.  Mild dysarthria.  no gaze palsy now, but still more right gaze preference, visual field full no hemianopia, PERRL.  Left facial droop. Tongue protrusion to the left.  Right upper and lower extremity at least 4/5,  left upper extremity deltoid and bicep 3-/5  , tricep and finger movement 0/5, left lower extremity 3/5 proximal and distal PF/DF 1/5.  Sensation decreased on the left. Right finger-to-nose intact.  Gait not tested.    ASSESSMENT/PLAN Mr. EPHRAIM REICHEL is a 45 y.o. male with PMH of heart murmur not on medications presented to ER for acute onset left arm and leg weakness, left visual and sensory neglect, right gaze preference, left facial droop nausea/vomiting. tPA given.  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  Discussed with patient regarding risk and benefit of thrombectomy, patient declined thrombectomy.     Stroke: Right MCA infarct, embolic pattern, source unclear but likely cardioembolic (see below)  CT head early ischemic changes right MCA but no hypodensity  CTA head neck was done showed right M2/M3 occlusion.   MRI right moderate MCA cortical infarcts   2D Echo EF 40 to 45%  LE venous Doppler no DVT  TEE EF 35-40%, no clot but LA appendage with SEC and reduced LAA emptying velocity. positive for PFO  TCD bubble study minmal (Spencer degree 2 ) PFO  LE venous Doppler negative for DVT  LDL 212  HgbA1c 5.3  UDS negative  Lovenox for VTE prophylaxis  No antithrombotic prior to admission, now on aspirin 81mg  daily and clopidogrel 75 mg daily DAPT for 3 months and then aspirin alone given LVO intracranially.  Patient counseled to be compliant with his antithrombotic medications  Ongoing aggressive stroke risk factor management  Therapy recommendations: SNF   Disposition: SNF  Cardiomyopathy  EF 40 to 45%  TEE EF 35-40% with LAA SEC and reduced emptying velocity  Cardiology Dr. on board  CTA chest no pulmonary AVM  outpt follow up with Dr. Jacinto Halim  ?? PFO  TEE positive for bilateral shunting suggestive of PFO  TCD bubble study minmal (Spencer degree 2 ) PFO  CTA chest no pulmonary AVM  Follow up with Dr. Jacinto Halim as  outpt  Hypertensive urgency . Needed labetalol and Cleviprex IV before IV tPA for BP control . Stable on the high end . Off Cleviprex . On amlodipine 10   Long term BP goal normotensive  Hyperlipidemia  Home meds: None  LDL 212, goal < 70  Now on Lipitor 80  Continue statin at discharge  Dysphagia  Mild dysarthria  On dysphagia 2 and thin liquid  intermittent cough improving  Depressed mood  Dealing with new stroke deficits and reporting depressed mood  Less participation reported by therapy staff  Celexa initiated   Other Stroke Risk Factors  Hx of heart murmur  Klebsiella UTI  Leukocytosis WBC 10.9-> 8.1->7.4->6.5->10.4->13.8->6.9->7.3  Keflex 500mg  po q 12 hours x 5-7 days   Other Active Problems  Hypokalemia K 2.7->3.1 ->3.3, supplement  MG 1.6 ->1.7, supplement  Hospital day # 15  Delila A Bailey-Modzik, NP-C This plan of  care was directed by Dr. Roda Shutters.   ATTENDING NOTE: I reviewed above note and agree with the assessment and plan. Pt was seen and examined.   No acute event overnight, no neuro change. 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   For detailed assessment and plan, please refer to above as I have made changes wherever appropriate.   Marvel Plan, MD PhD Stroke Neurology 10/16/2020 11:06 PM       To contact Stroke Continuity provider, please refer to [email or web address]. After hours, contact General Neurology

## 2020-10-16 NOTE — Progress Notes (Signed)
Occupational Therapy Treatment Note Pt seen in conjunction with COTA to reassess progress, update goals and further assess LUE. Pt moving LUE in flexor synergy pattern with isolated elbow extension out of pattern. L shoulder inferiorly subluxed; pt positions arm in wrist drop with dependent edema noted. Pt at risk of arm injury due to apparent cognitive deficits in addition to L neglect. L Deltoid taped to provide feed back and improve position of shoulder. L wrist taped with pull into extension. Pt to wear tape 3-5 days - will reassess skin integrity next visit. Discussed with MD and received verbal order for L wrist drop splint. Recommend pt wear L wrist splint in 2 hr intervals during the day/for functional tasks and throughout night to reduce risk of injury. Pt needs to keep LUE elevated on 2 pillows in bed/chair. Plan to discuss borrowing wc from CIR with L brake extension with focus on repetitive squat pivot transfers and ADL retraining to further assess pt's ability to incorporate strategies in order to reach goal of eventual return home.     10/16/20 1337  OT Visit Information  Last OT Received On 10/16/20  Assistance Needed +2 (for mobility)  PT/OT/SLP Co-Evaluation/Treatment  (partial session with COTA)  History of Present Illness 45 yo male presenting to ED with left sided weakness. CT showing early ischemic changes around right MCA but no hypodensity. tPA given on 4/18 @1823 . 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 Awaiting MRI. PMH including heart murmur not on medications.  Precautions  Precautions Fall  Precaution Comments LUE hemiparesis, subluxing LUE ( applied Ktape 5/3 to L shoulder and L wrist), L inattention  Required Braces or Orthoses Other Brace (L wrist cock up ordered)  Pain Assessment  Pain Assessment No/denies pain  Cognition  Arousal/Alertness Awake/alert  Behavior During Therapy WFL for tasks assessed/performed  Overall Cognitive Status  Impaired/Different from baseline  Area of Impairment Following commands;Safety/judgement;Awareness;Attention;Problem solving;Memory  Current Attention Level Selective  Memory Decreased short-term memory  Following Commands Follows one step commands consistently  Safety/Judgement Decreased awareness of safety;Decreased awareness of deficits  Awareness Emergent  Problem Solving Slow processing;Difficulty sequencing  General Comments decreased awareness of LUE during ADL and mobility  Upper Extremity Assessment  Upper Extremity Assessment LUE deficits/detail  Lower Extremity Assessment  Lower Extremity Assessment Defer to PT evaluation  ADL  Grooming Applying deodorant;Minimal assistance  General ADL Comments Pt completing ADL @ sink level with COTA. Overall Mod A for ADL tasks with at lest moc VC/redirection to task  Bed Mobility  General bed mobility comments OOB in chair on arrival  Balance  Sitting balance-Leahy Scale Fair  Sitting balance - Comments poor midline awarness  Vision- Assessment  Additional Comments poor visual attention  Transfers  Overall transfer level Needs assistance  Sit to Stand Min assist;+2 safety/equipment  General Comments  General comments (skin integrity, edema, etc.) Pt with apparent inferiorly subluxed L shoulder. Pt moving LUE in flexor synergy pattern however able to demonstrate isolated elbow extension out of synergy pattern. Did not observe wrist/hand movement with facilitation, however COTA states he can elicit wrist extension at times. Weakness most likely exaggerated due to L neglect. Pt with apparent dependent edema L hand and postions in L wrist drop. Pt with poor awareness of LUE - dropping/swinging arm at times - increasing risk of injusry to arm. Shoulder abducted and K tape placed middle deltoid with pull superiorly @ 50% stretch. Additional I strips placed over anterior and posterior deltoid to  increase pull superiorly. I strip also placed on  dorsal surface of foarearm with pull into wrist extension to give feedback in addition to supporting wrist. Will benefit from L wrist cock-up splint to improve position and help reduce risk of injury.  OT - End of Session  Equipment Utilized During Treatment Gait belt  Activity Tolerance Patient tolerated treatment well  Patient left in chair;with call bell/phone within reach;Other (comment) (working with Calton Golds)  Nurse Communication Mobility status;Other (comment) (positioning/use of splint)  OT Assessment/Plan  OT Plan Discharge plan remains appropriate;Frequency needs to be updated  OT Visit Diagnosis Unsteadiness on feet (R26.81);Other abnormalities of gait and mobility (R26.89);Muscle weakness (generalized) (M62.81);Hemiplegia and hemiparesis;Other symptoms and signs involving cognitive function  Hemiplegia - Right/Left Left  Hemiplegia - dominant/non-dominant Non-Dominant  Hemiplegia - caused by Cerebral infarction  OT Frequency (ACUTE ONLY) Min 3X/week  Recommendations for Other Services Rehab consult;PT consult;Speech consult  Follow Up Recommendations SNF  OT Equipment 3 in 1 bedside commode;Wheelchair (measurements OT);Wheelchair cushion (measurements OT)  AM-PAC OT "6 Clicks" Daily Activity Outcome Measure (Version 2)  Help from another person eating meals? 3  Help from another person taking care of personal grooming? 3  Help from another person toileting, which includes using toliet, bedpan, or urinal? 2  Help from another person bathing (including washing, rinsing, drying)? 2  Help from another person to put on and taking off regular upper body clothing? 3  Help from another person to put on and taking off regular lower body clothing? 2  6 Click Score 15  OT Goal Progression  Progress towards OT goals Progressing toward goals;Goals met and updated - see care plan  Acute Rehab OT Goals  Patient Stated Goal to be able to walk  OT Goal Formulation With patient  Time For Goal  Achievement 10/30/20  Potential to Achieve Goals Good  ADL Goals  Pt Will Perform Grooming sitting;with min assist  Pt Will Perform Upper Body Dressing with min assist;sitting  Pt Will Transfer to Toilet with min assist;bedside commode;squat pivot transfer  Pt Will Perform Toileting - Clothing Manipulation and hygiene with min assist;sitting/lateral leans  Additional ADL Goal #1 goal met and DC 5/3  Additional ADL Goal #2 Pt will follow 2 step commands with min vc in minimally distracting environment  Additional ADL Goal #3 Pt will identify 5/5 objects in L visual field with minimal VC to increase to increase independence with ADL  Additional ADL Goal #4 Pt will tolerate L wrist cock up splint for at least 2 hour time intervals to improve functional position of L wrist and reduce risk of injury  OT Time Calculation  OT Start Time (ACUTE ONLY) 1100  OT Stop Time (ACUTE ONLY) 1120  OT Time Calculation (min) 20 min  OT General Charges  $OT Visit 1 Visit  OT Treatments  $Therapeutic Activity 8-22 mins  Maurie Boettcher, OT/L   Acute OT Clinical Specialist Acute Rehabilitation Services Pager [phone] Office [phone]

## 2020-10-16 NOTE — Progress Notes (Signed)
SLP Cancellation Note  Patient Details Name: Harry [name] MRN: [id number] DOB: 11/13/1975   Cancelled treatment:       Reason Eval/Treat Not Completed: Other (comment) (pt working with PT)   Ardyth Gal MA, CCC-SLP Acute Rehabilitation Services   10/16/2020, 3:03 PM

## 2020-10-16 NOTE — Plan of Care (Signed)
  Problem: Education: Goal: Knowledge of disease or condition will improve Outcome: Progressing Goal: Knowledge of secondary prevention will improve Outcome: Progressing Goal: Knowledge of patient specific risk factors addressed and post discharge goals established will improve Outcome: Progressing Goal: Individualized Educational Video(s) Outcome: Progressing   

## 2020-10-16 NOTE — Progress Notes (Addendum)
Occupational Therapy Treatment Patient Details Name: Harry [name] MRN: [id number] DOB: January 22, 1976 Today's Date: 10/16/2020    History of present illness 45 yo male presenting to ED with left sided weakness. CT showing early ischemic changes around right MCA but no hypodensity. tPA given on 4/18 @1823 . 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 Awaiting MRI. PMH including heart murmur not on medications.   OT comments  Pt making steady progress towards OT goals this session. Pt seen in conjunction with OTR to collaborate on goal update. Pt continues to present with L sided inattention, impaired balance, and L sided hemiparesis. OTR applied kinesio tape to pts L shoulder and L wrist with good results.Additonally worked on standing balance at sink during grooming tasks. Pt required frequent cues to incorporate LUE into ADLs and cues to shift weight to LLE and maintain midline posture at sink. Pt would continue to benefit from skilled occupational therapy while admitted and after d/c to address the below listed limitations in order to improve overall functional mobility and facilitate independence with BADL participation. DC plan remains appropriate, will follow acutely per POC.     Follow Up Recommendations  SNF    Equipment Recommendations  Other (comment) (defer to next venue of care)    Recommendations for Other Services      Precautions / Restrictions Precautions Precautions: Fall;Other (comment) Precaution Comments: LUE hemiparesis, subluxing LUE ( applied Ktape 5/3 to L shoulder and L wrist), L inattention Restrictions Weight Bearing Restrictions: No       Mobility Bed Mobility Overal bed mobility: Needs Assistance Bed Mobility: Supine to Sit;Sit to Supine     Supine to sit: Min guard;HOB elevated Sit to supine: Min assist;HOB elevated   General bed mobility comments: pt able to transition to EOB with min guard assist +1 with increased time, MIN A +1  to return to supine needing assist to elevate BLEs back to bed    Transfers Overall transfer level: Needs assistance Equipment used: 2 person hand held assist   Sit to Stand: Min assist;+2 physical assistance        Lateral/Scoot Transfers: Min assist;Min guard General transfer comment: pt completed x2 sit<>stands with MIN A +2 with therapist blocking LLE during sit<>stand, pt needed assist to shift weight laterally to L side as pt wanting to keep all weight on R side. cues needed for upright posture and maintain trunk in midline position. Pt completed x2 lateral scoot transfer from EOB<>recliner with pt needing min guard +1 to scoot to pts R side and MIN A +1 to scoot to pts L side    Balance Overall balance assessment: Needs assistance Sitting-balance support: Feet supported;Single extremity supported Sitting balance-Leahy Scale: Fair     Standing balance support: Bilateral upper extremity supported;During functional activity Standing balance-Leahy Scale: Poor Standing balance comment: min A for maintaining balance with B UE support at the sink                           ADL either performed or assessed with clinical judgement   ADL Overall ADL's : Needs assistance/impaired     Grooming: Oral care;Standing;Minimal assistance Grooming Details (indicate cue type and reason): MIN A +2 for standing grooming tasks at sink, pt requries cues to incorporate LUE into ADLs and needs assist to support LLE and maintain upright midline posture  Toilet Transfer: Minimal assistance;Min Psychiatric nurse Details (indicate cue type and reason): lateral scoot transfer from EOB<>recliner with MIN A +1 to L side, minguard +1 to scoot to pt R side         Functional mobility during ADLs: Minimal assistance;+2 for physical assistance;+2 for safety/equipment;Min guard (minguard +1 to scoot into recliner to pts R side; MIN A +1 to scoot into recliner to pts L  side, MIN A +2 for sit<>stand) General ADL Comments: session conducted in conjunction with OTR for goal date with OTR applying K tape to pts L shouler and L wrist. additionally worked on standing ADLs at sink with a focus on incorporating LUE into ADLs and maintaining midline posture during standing ADLs     Vision       Perception     Praxis      Cognition Arousal/Alertness: Awake/alert Behavior During Therapy: WFL for tasks assessed/performed Overall Cognitive Status: Impaired/Different from baseline Area of Impairment: Following commands;Safety/judgement;Awareness;Attention;Problem solving;Memory                   Current Attention Level: Selective Memory: Decreased short-term memory (repeated cues for using LUE during ADLs) Following Commands: Follows one step commands with increased time;Follows multi-step commands inconsistently Safety/Judgement: Decreased awareness of safety;Decreased awareness of deficits Awareness: Emergent Problem Solving: Slow processing;Difficulty sequencing;Requires verbal cues;Requires tactile cues;Decreased initiation General Comments: pt starting to be more vocal this session, pt continues to require repeated cues for LUE placement and to attend to L side during mobility and ADLs        Exercises     Shoulder Instructions       General Comments applied K tape to pts L shoulder and L wrist    Pertinent Vitals/ Pain       Pain Assessment: No/denies pain  Home Living                                          Prior Functioning/Environment              Frequency  Min 2X/week        Progress Toward Goals  OT Goals(current goals can now be found in the care plan section)  Progress towards OT goals: Progressing toward goals  Acute Rehab OT Goals Patient Stated Goal: to be able to walk OT Goal Formulation: With patient Time For Goal Achievement: 10/30/20 (collaborated with OTR on updated goals) Potential  to Achieve Goals: Fair ADL Goals Pt Will Perform Grooming: sitting;with min assist Pt Will Perform Upper Body Dressing: with min assist;sitting Pt Will Transfer to Toilet: with min assist;bedside commode;squat pivot transfer Pt Will Perform Toileting - Clothing Manipulation and hygiene: with min assist;sitting/lateral leans Additional ADL Goal #1: goal met and DC 5/3 Additional ADL Goal #2: Pt will follow 2 step commands with min vc in minimally distracting environment Additional ADL Goal #3: Pt will identify 5/5 objects in L visual field with minimal VC to increase to increase independence with ADL  Plan Discharge plan remains appropriate;Frequency remains appropriate    Co-evaluation                 AM-PAC OT "6 Clicks" Daily Activity     Outcome Measure   Help from another person eating meals?: A Little Help from another person taking care of personal grooming?: A Little Help from another person toileting, which includes  using toliet, bedpan, or urinal?: A Lot Help from another person bathing (including washing, rinsing, drying)?: A Lot Help from another person to put on and taking off regular upper body clothing?: A Little Help from another person to put on and taking off regular lower body clothing?: A Lot 6 Click Score: 15    End of Session Equipment Utilized During Treatment: Gait belt;Other (comment) (K tape)  OT Visit Diagnosis: Unsteadiness on feet (R26.81);Other abnormalities of gait and mobility (R26.89);Muscle weakness (generalized) (M62.81);Hemiplegia and hemiparesis Hemiplegia - Right/Left: Left Hemiplegia - dominant/non-dominant: Non-Dominant Hemiplegia - caused by: Cerebral infarction   Activity Tolerance Patient tolerated treatment well   Patient Left in bed;with call bell/phone within reach;with bed alarm set   Nurse Communication Mobility status;Other (comment) (please do not remove K tape)        Time: 6837-2902 OT Time Calculation (min): 59  min  Charges: OT General Charges $OT Visit: 1 Visit OT Treatments $Self Care/Home Management : 38-52 mins  Harley Alto., COTA/L Acute Rehabilitation Services [phone]    Precious Haws 10/16/2020, 1:27 PM

## 2020-10-16 NOTE — Progress Notes (Signed)
Physical Therapy Treatment Patient Details Name: Harry [name] MRN: [id number] DOB: 1976-04-12 Today's Date: 10/16/2020    History of Present Illness 45 yo male presenting to ED with left sided weakness. CT showing early ischemic changes around right MCA but no hypodensity. tPA given on 4/18 @1823 . 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 Awaiting MRI. PMH including heart murmur not on medications.    PT Comments    Pt lying in bed looking out window on entry, reluctant to participate in therapy. Pt states "I'm feeling low. I don't have any energy." Pt mentions stroke and Mother's Day make him sad. With maximal encouragement pt is min guard for bed mobility and lateral scoot transfer to recliner. Attempted to work on walking with maxAx2 but pt with decreased strength and energy. Pt also noted to have more L sided neglect. PT sat on L side and requiring pt to turn and look with talking about how he is feeling. Will attempt to work with pt in the morning to see if he has more energy. Also informed MD of concerns about pt mood. Pt would benefit from SNF placement for PT and OT and also for social interaction with pts in similar situations as he is very isolated except for therapy sessions.      Follow Up Recommendations  SNF     Equipment Recommendations  3in1 (PT);Wheelchair cushion (measurements PT);Wheelchair (measurements PT)       Precautions / Restrictions Precautions Precautions: Fall;Other (comment) Precaution Comments: LUE hemiparesis, subluxing LUE Required Braces or Orthoses: Other Brace (L cock up brace) Restrictions Weight Bearing Restrictions: No    Mobility  Bed Mobility Overal bed mobility: Needs Assistance Bed Mobility: Supine to Sit     Supine to sit: Min guard Sit to supine: Min assist;HOB elevated   General bed mobility comments: min guard for safety with coming to EoB, increased cuing for attention to L side with movement to EoB     Transfers Overall transfer level: Needs assistance Equipment used: 2 person hand held assist   Sit to Stand: Mod assist;+2 physical assistance        Lateral/Scoot Transfers: Min guard General transfer comment: min guard for safety , increased cuing for sequencing, modAx2 for power up into standing  Ambulation/Gait Ambulation/Gait assistance: Max assist;+2 physical assistance Gait Distance (Feet): 4 Feet Assistive device: 2 person hand held assist Gait Pattern/deviations: Narrow base of support;Decreased weight shift to left;Decreased stance time - left;Decreased step length - right;Step-to pattern Gait velocity: decr   General Gait Details: maxAx2 for progressing 2 steps with use of window sill on R, then pivoted to stand looking out window for approx 2 min before sitting back in recliner      Modified Rankin (Stroke Patients Only) Modified Rankin (Stroke Patients Only) Pre-Morbid Rankin Score: No symptoms Modified Rankin: Moderately severe disability     Balance Overall balance assessment: Needs assistance Sitting-balance support: Feet supported;Single extremity supported Sitting balance-Leahy Scale: Fair Sitting balance - Comments: close guard for safety statically, requires light assist dynamically   Standing balance support: Bilateral upper extremity supported;During functional activity Standing balance-Leahy Scale: Poor Standing balance comment: min A for maintaining balance with B UE support at the sink                            Cognition Arousal/Alertness: Lethargic Behavior During Therapy: Flat affect Overall Cognitive Status: Impaired/Different from baseline Area of Impairment: Following commands;Safety/judgement;Awareness;Attention;Problem  solving                   Current Attention Level: Sustained Memory: Decreased short-term memory Following Commands: Follows one step commands with increased time;Follows multi-step commands  inconsistently Safety/Judgement: Decreased awareness of safety Awareness: Emergent Problem Solving: Slow processing;Difficulty sequencing;Requires verbal cues General Comments: lethargic today with decreased attention to L side, dragging it across bed to get to [facility]      Exercises Other Exercises Other Exercises: practice looking to his L    General Comments General comments (skin integrity, edema, etc.): Pt reports very low energy today, and states he is feeling low about his situation and Mother's Day makes him sad because his mother has passed, messaged MD about possible antidepressants in absence of counseling for loss associated with stoke      Pertinent Vitals/Pain Pain Assessment: No/denies pain Faces Pain Scale: No hurt           PT Goals (current goals can now be found in the care plan section) Acute Rehab PT Goals Patient Stated Goal: none stated today PT Goal Formulation: With patient/family Time For Goal Achievement: 10/16/20 Potential to Achieve Goals: Good    Frequency    Min 5X/week      PT Plan Current plan remains appropriate       AM-PAC PT "6 Clicks" Mobility   Outcome Measure  Help needed turning from your back to your side while in a flat bed without using bedrails?: A Lot Help needed moving from lying on your back to sitting on the side of a flat bed without using bedrails?: A Lot Help needed moving to and from a bed to a chair (including a wheelchair)?: A Lot Help needed standing up from a chair using your arms (e.g., wheelchair or bedside chair)?: A Lot Help needed to walk in hospital room?: A Lot Help needed climbing 3-5 steps with a railing? : Total 6 Click Score: 11    End of Session Equipment Utilized During Treatment: Gait belt Activity Tolerance: Patient limited by fatigue Patient left: with call bell/phone within reach;in chair;with chair alarm set Nurse Communication: Mobility status PT Visit Diagnosis: Unsteadiness on feet  (R26.81);Other abnormalities of gait and mobility (R26.89);Difficulty in walking, not elsewhere classified (R26.2);Hemiplegia and hemiparesis Hemiplegia - Right/Left: Left Hemiplegia - dominant/non-dominant: Non-dominant Hemiplegia - caused by: Cerebral infarction     Time: 4163-8453 PT Time Calculation (min) (ACUTE ONLY): 49 min  Charges:  $Gait Training: 8-22 mins $Therapeutic Activity: 8-22 mins $Neuromuscular Re-education: 8-22 mins                     Harry [name] B. Beverely Risen PT, DPT Acute Rehabilitation Services Pager [phone] Office [phone]    Elon Alas [facility] 10/16/2020, 4:48 PM

## 2020-10-17 LAB — CBC
HCT: 25.7 % — ABNORMAL LOW (ref 39.0–52.0)
Hemoglobin: 8.7 g/dL — ABNORMAL LOW (ref 13.0–17.0)
MCH: 29 pg (ref 26.0–34.0)
MCHC: 33.9 g/dL (ref 30.0–36.0)
MCV: 85.7 fL (ref 80.0–100.0)
Platelets: 462 10*3/uL — ABNORMAL HIGH (ref 150–400)
RBC: 3 MIL/uL — ABNORMAL LOW (ref 4.22–5.81)
RDW: 12.8 % (ref 11.5–15.5)
WBC: 6.7 10*3/uL (ref 4.0–10.5)
nRBC: 0 % (ref 0.0–0.2)

## 2020-10-17 LAB — BASIC METABOLIC PANEL
Anion gap: 8 (ref 5–15)
BUN: 18 mg/dL (ref 6–20)
CO2: 27 mmol/L (ref 22–32)
Calcium: 8.1 mg/dL — ABNORMAL LOW (ref 8.9–10.3)
Chloride: 105 mmol/L (ref 98–111)
Creatinine, Ser: 0.71 mg/dL (ref 0.61–1.24)
GFR, Estimated: >60 mL/min (ref >60)
Glucose, Bld: 96 mg/dL (ref 70–99)
Potassium: 3.8 mmol/L (ref 3.5–5.1)
Sodium: 140 mmol/L (ref 135–145)

## 2020-10-17 NOTE — Progress Notes (Signed)
STROKE TEAM PROGRESS NOTE   SUBJECTIVE (INTERVAL HISTORY) No family at bedside.  Patient lying in bed, not in depressed mood.  Started Celexa yesterday.  Still has left hemiparesis, on left wrist splint.  Vital signs and labs are stable.  OBJECTIVE Temp:  [98 F (36.7 C)-98.9 F (37.2 C)] 98.2 F (36.8 C) (05/04 1937) Pulse Rate:  [76-84] 82 (05/04 1937) Cardiac Rhythm: Normal sinus rhythm (05/04 0809) Resp:  [18-20] 18 (05/04 1937) BP: (129-147)/(79-89) 129/79 (05/04 1937) SpO2:  [99 %-100 %] 100 % (05/04 1937)  No results for input(s): GLUCAP in the last 168 hours. Recent Labs  Lab 10/13/20 0245 10/13/20 0745 10/15/20 0426 10/17/20 0421  NA 138  --  140 140  K 2.7* 3.1* 3.3* 3.8  CL 99  --  101 105  CO2 28  --  28 27  GLUCOSE 99  --  99 96  BUN 11  --  13 18  CREATININE 0.89  --  0.76 0.71  CALCIUM 7.8*  --  8.1* 8.1*  MG  --  1.6* 1.7  --    Recent Labs  Lab 10/13/20 0245  AST 28  ALT 17  ALKPHOS 51  BILITOT 0.4  PROT 4.2*  ALBUMIN 1.4*   Recent Labs  Lab 10/13/20 0245 10/15/20 0426 10/17/20 0421  WBC 6.9 7.3 6.7  HGB 8.9* 9.1* 8.7*  HCT 26.4* 26.6* 25.7*  MCV 85.7 85.0 85.7  PLT 463* 476* 462*        Component Value Date/Time   CHOL 297 (H) 10/02/2020 0607   TRIG 62 10/02/2020 0607   HDL 73 10/02/2020 0607   CHOLHDL 4.1 10/02/2020 0607   VLDL 12 10/02/2020 0607   LDLCALC 212 (H) 10/02/2020 0607   Lab Results  Component Value Date   HGBA1C 5.3 10/02/2020      Component Value Date/Time   LABOPIA NONE DETECTED 10/05/2020 1321   COCAINSCRNUR NONE DETECTED 10/05/2020 1321   LABBENZ NONE DETECTED 10/05/2020 1321   AMPHETMU NONE DETECTED 10/05/2020 1321   THCU NONE DETECTED 10/05/2020 1321   LABBARB NONE DETECTED 10/05/2020 1321    No results for input(s): ETH in the last 168 hours.  MR Brain 1. Acute infarcts in the anterior and posterior right frontal lobe. Associated edema with sulcal effacement. Mild curvilinear susceptibility  artifact the regions of infarct likely represent petechial hemorrhage. No mass occupying acute hemorrhage. 2. Focal susceptibility artifact within a right M2 MCA branch in the region of the more posterior frontal lobe infarct, compatible with thrombus and known occlusion better characterized on recent CTA. 3. 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 4. Large right mastoid effusion  CTA 1. No pulmonary vascular malformation or acute intrathoracic abnormality. 2. Mild cardiomegaly  CT head 1. Acute infarcts in the anterior and posterior right frontal lobe. Associated edema with sulcal effacement. Mild curvilinear susceptibility artifact the regions of infarct likely represent petechial hemorrhage. No mass occupying acute hemorrhage. 2. Focal susceptibility artifact within a right M2 MCA branch in the region of the more posterior frontal lobe infarct, compatible with thrombus and known occlusion better characterized on recent CTA. 3. 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 4. Large right mastoid effusion  PHYSICAL EXAM  Temp:  [98 F (36.7 C)-98.9 F (37.2 C)] 98.2 F (36.8 C) (05/04 1937) Pulse Rate:  [76-84] 82 (05/04 1937) Resp:  [18-20] 18 (05/04 1937) BP: (129-147)/(79-89) 129/79 (05/04 1937) SpO2:  [99 %-100 %] 100 % (05/04 1937)  General -  frail middle-age African-American male not in distress Resp: No extra work of breathing Cardiovascular - Regular rhythm and rate.  Neuro - awake alert and oriented  to age, place, time and people. No aphasia, mild dysarthria but fluent language, following all simple commands. Able to name and repeat.  Mild dysarthria.  no gaze palsy now, but still more right gaze preference, visual field full no hemianopia, PERRL.  Left facial droop. Tongue protrusion to the left.  Right upper and lower extremity at least 4/5, left upper extremity deltoid and bicep 3-/5  , tricep and finger movement 0/5, left lower  extremity 3/5 proximal and distal PF/DF 1/5.  Sensation decreased on the left. Right finger-to-nose intact.  Gait not tested.    ASSESSMENT/PLAN [name] is a 45 y.o. male with PMH of heart murmur not on medications presented to ER for acute onset left arm and leg weakness, left visual and sensory neglect, right gaze preference, left facial droop nausea/vomiting. tPA given.  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  Discussed with patient regarding risk and benefit of thrombectomy, patient declined thrombectomy.     Stroke: Right MCA infarct, embolic pattern, source unclear but likely cardioembolic (see below)  CT head early ischemic changes right MCA but no hypodensity  CTA head neck was done showed right M2/M3 occlusion.   MRI right moderate MCA cortical infarcts   2D Echo EF 40 to 45%  LE venous Doppler no DVT  TEE EF 35-40%, no clot but LA appendage with SEC and reduced LAA emptying velocity. positive for PFO  TCD bubble study minmal (Spencer degree 2 ) PFO  LE venous Doppler negative for DVT  LDL 212  HgbA1c 5.3  UDS negative  Lovenox for VTE prophylaxis  No antithrombotic prior to admission, now on aspirin 81mg  daily and clopidogrel 75 mg daily DAPT for 3 months and then aspirin alone given LVO intracranially.  Patient counseled to be compliant with his antithrombotic medications  Ongoing aggressive stroke risk factor management  Therapy recommendations: SNF   Disposition: SNF  Cardiomyopathy  EF 40 to 45%  TEE EF 35-40% with LAA SEC and reduced emptying velocity  Cardiology Dr. on board  CTA chest no pulmonary AVM  outpt follow up with Dr. Jacinto Halim  ?? PFO  TEE positive for bilateral shunting suggestive of PFO  TCD bubble study minmal (Spencer degree 2 ) PFO  CTA chest no pulmonary AVM  Follow up with Dr. Jacinto Halim as outpt  Hypertensive urgency . Needed labetalol and Cleviprex IV before IV tPA for BP  control . Stable on the high end . Off Cleviprex . On amlodipine 10   Long term BP goal normotensive  Hyperlipidemia  Home meds: None  LDL 212, goal < 70  Now on Lipitor 80  Continue statin at discharge  Dysphagia  Mild dysarthria  On dysphagia 2 and thin liquid  intermittent cough improving  Depressed mood  Dealing with new stroke deficits and reporting depressed mood  Less participation reported by therapy staff  Celexa initiated   Other Stroke Risk Factors  Hx of heart murmur  Klebsiella UTI  Leukocytosis WBC 10.9-> 8.1->7.4->6.5->10.4->13.8->6.9->7.3->6.7  Keflex 500mg  po q 12 hours x 5-7 days   Other Active Problems  Hypokalemia K 2.7->3.1 ->3.3->3.8, supplement  MG 1.6 ->1.7, supplement  Hospital day # 16   Jacinto Halim, MD PhD Stroke Neurology 10/17/2020 8:10 PM       To contact Stroke Continuity provider, please  refer to [email or web address] After hours, contact General Neurology

## 2020-10-17 NOTE — Progress Notes (Signed)
Occupational Therapy Treatment Patient Details Name: Harry [name] MRN: [id number] DOB: 1976-02-06 Today's Date: 10/17/2020    History of present illness 45 yo male presenting to ED with left sided weakness. CT showing early ischemic changes around right MCA but no hypodensity. tPA given on 4/18 @1823 . 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 Awaiting MRI. PMH including heart murmur not on medications.   OT comments  Pt making steady progress towards OT goals this session. Session focus on squat pivot transfer to w/c, standing balance therapeutic activities facilitating weight shift to LLE and w/c mobility. Pt currently requires MOD A +1 to complete squat pivot to w/c to pts L side, pt required cues to attend to LUE and LLE during transfer. Worked on w/c mobility with pt needing MAX multimodal cues to propel w/c d/t impaired motor planning and attention to task. Pt completed ~ 10 ft of w/c propulsion. Worked on standing balance in front of mirror with pt able to sit<>stand x3  with MIN A +2. Instructed pt on shifting weight to L side in order to kick ball with RLE, pt needing MOD A +2 for support during weight shift. DC plan remains appropriate, will follow acutely per POC.    Follow Up Recommendations  SNF    Equipment Recommendations  3 in 1 bedside commode;Wheelchair (measurements OT);Wheelchair cushion (measurements OT)    Recommendations for Other Services      Precautions / Restrictions Precautions Precautions: Fall;Other (comment) Precaution Comments: LUE hemiparesis, subluxing LUE Required Braces or Orthoses: Other Brace Other Brace: L cock up wrist splint Restrictions Weight Bearing Restrictions: No       Mobility Bed Mobility Overal bed mobility: Needs Assistance Bed Mobility: Supine to Sit;Sit to Supine     Supine to sit: Min guard Sit to supine: Min guard   General bed mobility comments: minguard for bed mobility with increased time effort,  cues needed to attend to LUE and LLE during transfer    Transfers Overall transfer level: Needs assistance Equipment used: 2 person hand held assist Transfers: Sit to/from Sit to Stand: +2 physical assistance;Min assist   Squat pivot transfers: Mod assist     General transfer comment: MIN A +2 x3 sit<>stand from w/c, cues needed for hand placement and physical assist to rise, MOD A +1 to complete squat pivot to w/c to pts L side. additionally worked on w/c mobility with pt needing MAX multimodal cues to sequence using RUE and RLE underneath LLE to propel w/c    Balance Overall balance assessment: Needs assistance Sitting-balance support: Feet supported;Single extremity supported Sitting balance-Leahy Scale: Fair     Standing balance support: Bilateral upper extremity supported;During functional activity Standing balance-Leahy Scale: Poor Standing balance comment: min A for maintaining balance with B UE, worked on lateral weight shifts in standing with pt having to shift weight completely to L side to p/u R foot to kick ball                           ADL either performed or assessed with clinical judgement   ADL Overall ADL's : Needs assistance/impaired                 Upper Body Dressing : Minimal assistance;Sitting Upper Body Dressing Details (indicate cue type and reason): pt able to place LUE in sleeve as gown using RUE as assist     Toilet Transfer: Moderate assistance;Squat-pivot Toilet  Transfer Details (indicate cue type and reason): simulated via squat pivot from EOB<>w/c to pts L side, pt needs cues to attend to LUE  during transfer         Functional mobility during ADLs: Moderate assistance (squat pivot to w/c) General ADL Comments: session focus on squat pivot transfer to w/c, standing task facilitating weight shift to LLE and w/c mobility     Vision       Perception     Praxis      Cognition  Arousal/Alertness: Awake/alert;Lethargic (initially lethargic but arouses more as session progresses) Behavior During Therapy: Flat affect Overall Cognitive Status: Impaired/Different from baseline Area of Impairment: Following commands;Safety/judgement;Awareness;Attention;Problem solving                   Current Attention Level: Sustained;Selective Memory: Decreased short-term memory;Decreased recall of precautions (repeated cues for LUE mgmt during session) Following Commands: Follows one step commands with increased time;Follows multi-step commands inconsistently Safety/Judgement: Decreased awareness of safety;Decreased awareness of deficits (L inattention) Awareness: Emergent Problem Solving: Slow processing;Difficulty sequencing;Requires verbal cues;Requires tactile cues General Comments: pt continues to be easily distracted even in low traffic enviroment, pt needs repeated cues for LUE mgmt during session        Exercises     Shoulder Instructions       General Comments doffed L wrist cock up splint for skin check, mild redness noted on anterior aspect of pts wrist, doffed splint and adjusted fit to reduce skin breakdown    Pertinent Vitals/ Pain       Pain Assessment: No/denies pain  Home Living                                          Prior Functioning/Environment              Frequency  Min 3X/week        Progress Toward Goals  OT Goals(current goals can now be found in the care plan section)  Progress towards OT goals: Progressing toward goals  Acute Rehab OT Goals Patient Stated Goal: none stated today OT Goal Formulation: With patient Time For Goal Achievement: 10/30/20 Potential to Achieve Goals: Good  Plan Discharge plan remains appropriate;Frequency remains appropriate    Co-evaluation                 AM-PAC OT "6 Clicks" Daily Activity     Outcome Measure   Help from another person eating meals?: A  Little Help from another person taking care of personal grooming?: A Little Help from another person toileting, which includes using toliet, bedpan, or urinal?: A Lot Help from another person bathing (including washing, rinsing, drying)?: A Lot Help from another person to put on and taking off regular upper body clothing?: A Little Help from another person to put on and taking off regular lower body clothing?: A Lot 6 Click Score: 15    End of Session Equipment Utilized During Treatment: Gait belt;Other (comment) (w/c)  OT Visit Diagnosis: Unsteadiness on feet (R26.81);Other abnormalities of gait and mobility (R26.89);Muscle weakness (generalized) (M62.81);Hemiplegia and hemiparesis;Other symptoms and signs involving cognitive function Hemiplegia - Right/Left: Left Hemiplegia - dominant/non-dominant: Non-Dominant Hemiplegia - caused by: Cerebral infarction   Activity Tolerance Patient tolerated treatment well   Patient Left in bed;with call bell/phone within reach;with bed alarm set;Other (comment) (on phone at end of session)   Nurse  Communication Mobility status        Time: 1434-1520 OT Time Calculation (min): 46 min  Charges: OT General Charges $OT Visit: 1 Visit OT Treatments $Therapeutic Activity: 38-52 mins  Lenor Derrick., COTA/L Acute Rehabilitation Services [phone]    Barron Schmid 10/17/2020, 5:00 PM

## 2020-10-17 NOTE — Progress Notes (Signed)
Physical Therapy Treatment Patient Details Name: Harry [name] MRN: [id number] DOB: 26-Feb-1976 Today's Date: 10/17/2020    History of Present Illness 45 yo male presenting to ED with left sided weakness. CT showing early ischemic changes around right MCA but no hypodensity. tPA given on 4/18 @1823 . 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 Awaiting MRI. PMH including heart murmur not on medications.    PT Comments    Pt with increased participation and attention to L side with minor cuing. Pt agreeable to getting up to "dance" working on weight shifting L knee terminal extension for stability, lateral stepping, and balance. Pt able to remain standing for ~8 min with modAx2 for support. Pt with report of fatigue and requested to sit. Once in sitting worked on truncal rotation to both sides and UE AROM/AAROM. Pt able to return to supine with min guard assist and increased time by utilizing bilateral hip flexion and rotation. Once in supine had pt work on bridging exercises. Pt continues to make progress towards his goals and PT will continue to see pt acutely until discharge to safe location can be arranged.     Follow Up Recommendations  SNF     Equipment Recommendations  3in1 (PT);Wheelchair cushion (measurements PT);Wheelchair (measurements PT)       Precautions / Restrictions Precautions Precautions: Fall;Other (comment) Precaution Comments: LUE hemiparesis, subluxing LUE Required Braces or Orthoses: Other Brace (L cock up brace) Restrictions Weight Bearing Restrictions: No    Mobility  Bed Mobility Overal bed mobility: Needs Assistance Bed Mobility: Supine to Sit     Supine to sit: Min guard Sit to supine: Min guard   General bed mobility comments: min guard for safety with coming to EoB, improved attention to L side. pt able to return bilateral LE to bed with increased bilateral hip flexion    Transfers Overall transfer level: Needs  assistance Equipment used: 2 person hand held assist   Sit to Stand: +2 physical assistance;Min assist         General transfer comment: min A x2 for coming to standing x2  Ambulation/Gait Ambulation/Gait assistance: +2 physical assistance;Mod assist   Assistive device: 2 person hand held assist Gait Pattern/deviations: Narrow base of support;Decreased weight shift to left;Decreased stance time - left;Decreased step length - right;Step-to pattern Gait velocity: decr Gait velocity interpretation: <1.31 ft/sec, indicative of household ambulator General Gait Details: modAx2 for short distance ambulation, face to face with R Tech and therapist behind to provide assist with LE advancement cuing for terminal extension of L knee to provide support for R LE progressiong       Modified Rankin (Stroke Patients Only) Modified Rankin (Stroke Patients Only) Pre-Morbid Rankin Score: No symptoms Modified Rankin: Moderately severe disability     Balance Overall balance assessment: Needs assistance Sitting-balance support: Feet supported;Single extremity supported Sitting balance-Leahy Scale: Fair Sitting balance - Comments: close guard for safety statically, requires light assist dynamically   Standing balance support: Bilateral upper extremity supported;During functional activity Standing balance-Leahy Scale: Poor Standing balance comment: min A for maintaining balance with B UE                            Cognition Arousal/Alertness: Lethargic Behavior During Therapy: Flat affect Overall Cognitive Status: Impaired/Different from baseline Area of Impairment: Following commands;Safety/judgement;Awareness;Attention;Problem solving                   Current Attention Level:  Sustained   Following Commands: Follows one step commands with increased time;Follows multi-step commands with increased time Safety/Judgement: Decreased awareness of safety Awareness:  Emergent Problem Solving: Slow processing;Difficulty sequencing;Requires verbal cues;Requires tactile cues General Comments: pt with increased energy level with therapy today. Pt joking around and very participatory with better attention to L side, however fatigues quickly requiring rest breaks      Exercises Other Exercises Other Exercises: Dancing - working on L knee terminal extension to beat of music, forward Other Exercises: looking over bilateral shoulders with trunk turn x 5 each side Other Exercises: bilateral UE ROM to music in standing and sitting    General Comments General comments (skin integrity, edema, etc.): Pt with more energy, participation and attention to L side today.      Pertinent Vitals/Pain Pain Assessment: No/denies pain Faces Pain Scale: No hurt           PT Goals (current goals can now be found in the care plan section) Acute Rehab PT Goals Patient Stated Goal: none stated today PT Goal Formulation: With patient/family Time For Goal Achievement: 10/16/20 Potential to Achieve Goals: Good Progress towards PT goals: Progressing toward goals    Frequency    Min 5X/week      PT Plan Current plan remains appropriate       AM-PAC PT "6 Clicks" Mobility   Outcome Measure  Help needed turning from your back to your side while in a flat bed without using bedrails?: A Lot Help needed moving from lying on your back to sitting on the side of a flat bed without using bedrails?: A Lot Help needed moving to and from a bed to a chair (including a wheelchair)?: A Lot Help needed standing up from a chair using your arms (e.g., wheelchair or bedside chair)?: A Lot Help needed to walk in hospital room?: A Lot Help needed climbing 3-5 steps with a railing? : Total 6 Click Score: 11    End of Session Equipment Utilized During Treatment: Gait belt Activity Tolerance: Patient tolerated treatment well Patient left: with call bell/phone within reach;with bed  alarm set;in bed Nurse Communication: Mobility status PT Visit Diagnosis: Unsteadiness on feet (R26.81);Other abnormalities of gait and mobility (R26.89);Difficulty in walking, not elsewhere classified (R26.2);Hemiplegia and hemiparesis Hemiplegia - Right/Left: Left Hemiplegia - dominant/non-dominant: Non-dominant Hemiplegia - caused by: Cerebral infarction     Time: 4680-3212 PT Time Calculation (min) (ACUTE ONLY): 46 min  Charges:  $Therapeutic Exercise: 8-22 mins $Therapeutic Activity: 8-22 mins $Neuromuscular Re-education: 8-22 mins                     Soundra Lampley B. Beverely Risen PT, DPT Acute Rehabilitation Services Pager [phone] Office [phone]    Elon Alas Fleet 10/17/2020, 1:47 PM

## 2020-10-18 NOTE — Progress Notes (Signed)
STROKE TEAM PROGRESS NOTE   SUBJECTIVE (INTERVAL HISTORY) No family at the bedside. No acute change, neuro stable. PT/OT, pending SNF.   OBJECTIVE Temp:  [98 F (36.7 C)-98.6 F (37 C)] 98.6 F (37 C) (05/05 1137) Pulse Rate:  [70-82] 70 (05/05 1137) Cardiac Rhythm: Normal sinus rhythm (05/05 0800) Resp:  [16-20] 16 (05/05 1137) BP: (122-146)/(73-93) 141/84 (05/05 1137) SpO2:  [98 %-100 %] 100 % (05/05 1137)  No results for input(s): GLUCAP in the last 168 hours. Recent Labs  Lab 10/13/20 0245 10/13/20 0745 10/15/20 0426 10/17/20 0421  NA 138  --  140 140  K 2.7* 3.1* 3.3* 3.8  CL 99  --  101 105  CO2 28  --  28 27  GLUCOSE 99  --  99 96  BUN 11  --  13 18  CREATININE 0.89  --  0.76 0.71  CALCIUM 7.8*  --  8.1* 8.1*  MG  --  1.6* 1.7  --    Recent Labs  Lab 10/13/20 0245  AST 28  ALT 17  ALKPHOS 51  BILITOT 0.4  PROT 4.2*  ALBUMIN 1.4*   Recent Labs  Lab 10/13/20 0245 10/15/20 0426 10/17/20 0421  WBC 6.9 7.3 6.7  HGB 8.9* 9.1* 8.7*  HCT 26.4* 26.6* 25.7*  MCV 85.7 85.0 85.7  PLT 463* 476* 462*        Component Value Date/Time   CHOL 297 (H) 10/02/2020 0607   TRIG 62 10/02/2020 0607   HDL 73 10/02/2020 0607   CHOLHDL 4.1 10/02/2020 0607   VLDL 12 10/02/2020 0607   LDLCALC 212 (H) 10/02/2020 0607   Lab Results  Component Value Date   HGBA1C 5.3 10/02/2020      Component Value Date/Time   LABOPIA NONE DETECTED 10/05/2020 1321   COCAINSCRNUR NONE DETECTED 10/05/2020 1321   LABBENZ NONE DETECTED 10/05/2020 1321   AMPHETMU NONE DETECTED 10/05/2020 1321   THCU NONE DETECTED 10/05/2020 1321   LABBARB NONE DETECTED 10/05/2020 1321    No results for input(s): ETH in the last 168 hours.  MR Brain 1. Acute infarcts in the anterior and posterior right frontal lobe. Associated edema with sulcal effacement. Mild curvilinear susceptibility artifact the regions of infarct likely represent petechial hemorrhage. No mass occupying acute hemorrhage. 2.  Focal susceptibility artifact within a right M2 MCA branch in the region of the more posterior frontal lobe infarct, compatible with thrombus and known occlusion better characterized on recent CTA. 3. 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 4. Large right mastoid effusion  CTA 1. No pulmonary vascular malformation or acute intrathoracic abnormality. 2. Mild cardiomegaly  CT head 1. Acute infarcts in the anterior and posterior right frontal lobe. Associated edema with sulcal effacement. Mild curvilinear susceptibility artifact the regions of infarct likely represent petechial hemorrhage. No mass occupying acute hemorrhage. 2. Focal susceptibility artifact within a right M2 MCA branch in the region of the more posterior frontal lobe infarct, compatible with thrombus and known occlusion better characterized on recent CTA. 3. 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 4. Large right mastoid effusion  PHYSICAL EXAM  Temp:  [98 F (36.7 C)-98.6 F (37 C)] 98.6 F (37 C) (05/05 1137) Pulse Rate:  [70-82] 70 (05/05 1137) Resp:  [16-20] 16 (05/05 1137) BP: (122-146)/(73-93) 141/84 (05/05 1137) SpO2:  [98 %-100 %] 100 % (05/05 1137)  General -frail middle-age African-American male not in distress Resp: No extra work of breathing Cardiovascular - Regular rhythm and rate.  Neuro - awake alert and oriented  to age, place, time and people. No aphasia, mild dysarthria but fluent language, following all simple commands. Able to name and repeat.  Mild dysarthria.  no gaze palsy now, but still more right gaze preference, visual field full no hemianopia, PERRL.  Left facial droop. Tongue protrusion to the left.  Right upper and lower extremity at least 4/5, left upper extremity deltoid and bicep 3-/5  , tricep and finger movement 0/5, left lower extremity 3/5 proximal and distal PF/DF 1/5.  Sensation decreased on the left. Right finger-to-nose intact.  Gait not  tested.    ASSESSMENT/PLAN Harry [name] is a 45 y.o. male with PMH of heart murmur not on medications presented to ER for acute onset left arm and leg weakness, left visual and sensory neglect, right gaze preference, left facial droop nausea/vomiting. tPA given.  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  Discussed with patient regarding risk and benefit of thrombectomy, patient declined thrombectomy.     Stroke: Right MCA infarct, embolic pattern, source unclear but likely cardioembolic (see below)  CT head early ischemic changes right MCA but no hypodensity  CTA head neck was done showed right M2/M3 occlusion.   MRI right moderate MCA cortical infarcts   2D Echo EF 40 to 45%  LE venous Doppler no DVT  TEE EF 35-40%, no clot but LA appendage with SEC and reduced LAA emptying velocity. positive for PFO  TCD bubble study minmal (Spencer degree 2 ) PFO  LE venous Doppler negative for DVT  LDL 212  HgbA1c 5.3  UDS negative  Lovenox for VTE prophylaxis  No antithrombotic prior to admission, now on aspirin 81mg  daily and clopidogrel 75 mg daily DAPT for 3 months and then aspirin alone given LVO intracranially.  Patient counseled to be compliant with his antithrombotic medications  Ongoing aggressive stroke risk factor management  Therapy recommendations: SNF   Disposition: SNF  Cardiomyopathy  EF 40 to 45%  TEE EF 35-40% with LAA SEC and reduced emptying velocity  Cardiology Dr. on board  CTA chest no pulmonary AVM  outpt follow up with Dr. Jacinto Halim  ?? PFO  TEE positive for bilateral shunting suggestive of PFO  TCD bubble study minmal (Spencer degree 2 ) PFO  CTA chest no pulmonary AVM  Follow up with Dr. Jacinto Halim as outpt  Hypertensive urgency . Needed labetalol and Cleviprex IV before IV tPA for BP control . Stable on the high end . Off Cleviprex . On amlodipine 10   Long term BP goal  normotensive  Hyperlipidemia  Home meds: None  LDL 212, goal < 70  Now on Lipitor 80  Continue statin at discharge  Dysphagia  Mild dysarthria  On dysphagia 2 and thin liquid  intermittent cough improving  Depressed mood  Dealing with new stroke deficits and reporting depressed mood  Less participation reported by therapy staff  Celexa initiated   Other Stroke Risk Factors  Hx of heart murmur  Klebsiella UTI  Leukocytosis WBC 10.9-> 8.1->7.4->6.5->10.4->13.8->6.9->7.3->6.7  Keflex 500mg  po q 12 hours x 5-7 days   Other Active Problems  Hypokalemia K 2.7->3.1 ->3.3->3.8, supplement  MG 1.6 ->1.7, supplement  Hospital day # 17   Jacinto Halim, MD PhD Stroke Neurology 10/18/2020 2:23 PM       To contact Stroke Continuity provider, please refer to [facility] Plan. After hours, contact General Neurology

## 2020-10-18 NOTE — Progress Notes (Signed)
Occupational Therapy Treatment Patient Details Name: Harry [name] MRN: [id number] DOB: 11-14-1975 Today's Date: 10/18/2020    History of present illness 45 yo male presenting to ED with left sided weakness. CT showing early ischemic changes around right MCA but no hypodensity. tPA given on 4/18 @1823 . 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 Awaiting MRI. PMH including heart murmur not on medications.   OT comments  Pt making steady progress towards OT goals this session. Session focus on transfer training from EOB>w/c and EOB>drop arm 3n1, w/c mobility as well as standing balance with a focus on weight shifting to L side. Pt currently requires MIN A +1 to scoot to w/c and drop arm 3n1 to pts R side but MOD A +1 when transferring to L side, cues needed throughout transfer to manage LUE during transfer. Pt able to complete ~ 8 ft of w/c mobility in hallway however pt with difficulty with motor planning and attention to task in busy hallway. Worked on standing balance with a focus on weight shifting to L by having pt step R foot up on step, Pt required MOD A +2 for standing balance and assist at LLE to facilitate quad activation when shifting to L side. Pt noted to become nauseous requesting to sit down with pt having episode of dry heaving, pt requested to return to room, alerted RN. Pt would continue to benefit from skilled occupational therapy while admitted and after d/c to address the below listed limitations in order to improve overall functional mobility and facilitate independence with BADL participation. DC plan remains appropriate, will follow acutely per POC.     Follow Up Recommendations  SNF    Equipment Recommendations  3 in 1 bedside commode;Wheelchair (measurements OT);Wheelchair cushion (measurements OT)    Recommendations for Other Services      Precautions / Restrictions Precautions Precautions: Fall;Other (comment) Precaution Comments: LUE  hemiparesis, subluxing LUE Required Braces or Orthoses: Other Brace Other Brace: L cock up wrist splint Restrictions Weight Bearing Restrictions: No       Mobility Bed Mobility Overal bed mobility: Needs Assistance Bed Mobility: Supine to Sit;Sit to Supine     Supine to sit: Min guard Sit to supine: Min guard   General bed mobility comments: minguard for bed mobility with increased time effort, cues needed to attend to LUE and LLE during transfer    Transfers Overall transfer level: Needs assistance Equipment used: 2 person hand held assist Transfers: Sit to/from Stand;Lateral/Scoot Transfers Sit to Stand: Mod assist;+2 physical assistance        Lateral/Scoot Transfers: Min assist;Mod assist General transfer comment: MOD A +2 to rise into standing from w/c, pt requried increaed cues for hand placement as pt wanting to pull up into standing with RUE pulling up on rail on stairs, MOD A +1 to pivot to laterally scoot to drop arm 3n1 to pts L side, MIN A +1 to scoot back to EOB to pts Rside    Balance Overall balance assessment: Needs assistance Sitting-balance support: Feet supported;Single extremity supported Sitting balance-Leahy Scale: Fair     Standing balance support: Bilateral upper extremity supported;During functional activity Standing balance-Leahy Scale: Poor Standing balance comment: MIN A for static standing balance however pt needing more of MOD A when working on weight shifts to L                           ADL either performed or assessed  with clinical judgement   ADL Overall ADL's : Needs assistance/impaired     Grooming: Wash/dry face;Sitting;Supervision/safety;Set up           Upper Body Dressing : Minimal assistance;Sitting Upper Body Dressing Details (indicate cue type and reason): pt able to place LUE in sleeve as gown using RUE as assist     Toilet Transfer: Moderate assistance;Squat-pivot Toilet Transfer Details (indicate cue  type and reason): simulated via squat pivot from EOB<>w/c and worked on latearl scoot transfer from EOB<>drop arm 3n1, MOD A +1 with cues needed to manage LUE during transfer         Functional mobility during ADLs: Moderate assistance (squat pivot) General ADL Comments: session focus on transfer training from EOB>w/c and EOB>drop arm 3n1 as well as standing balance with a focus on weight shifting to L side     Vision       Perception     Praxis      Cognition Arousal/Alertness: Awake/alert Behavior During Therapy: WFL for tasks assessed/performed Overall Cognitive Status: Impaired/Different from baseline Area of Impairment: Attention;Memory;Following commands;Safety/judgement;Awareness                   Current Attention Level: Sustained;Selective (selective in hallway; sustained in low traffic environment) Memory: Decreased short-term memory;Decreased recall of precautions (repeated cues for management of LUE during mobility and transfers) Following Commands: Follows one step commands with increased time;Follows multi-step commands inconsistently Safety/Judgement: Decreased awareness of safety;Decreased awareness of deficits (L inattention) Awareness: Emergent Problem Solving: Slow processing;Difficulty sequencing;Requires verbal cues;Decreased initiation General Comments: pt continues to need cues to attend to LUE during mobility and continues to be easily distracted in busy environments        Exercises     Shoulder Instructions       General Comments doffed L wrist splint at end of session and rested LUE on pillow, posted sign over pts head of splint wear and schedule to increase carryover of information for staff    Pertinent Vitals/ Pain       Pain Assessment: Faces Faces Pain Scale: Hurts a little bit Pain Location: general discomfort from feeling nauseous Pain Descriptors / Indicators: Discomfort;Grimacing;Crying Pain Intervention(s): Monitored during  session;Repositioned;Other (comment) (alerted RN about meds for nausea)  Home Living                                          Prior Functioning/Environment              Frequency  Min 3X/week        Progress Toward Goals  OT Goals(current goals can now be found in the care plan section)  Progress towards OT goals: Progressing toward goals  Acute Rehab OT Goals Patient Stated Goal: none stated today OT Goal Formulation: With patient Time For Goal Achievement: 10/30/20 Potential to Achieve Goals: Good  Plan Discharge plan remains appropriate;Frequency remains appropriate    Co-evaluation                 AM-PAC OT "6 Clicks" Daily Activity     Outcome Measure   Help from another person eating meals?: A Little Help from another person taking care of personal grooming?: A Little Help from another person toileting, which includes using toliet, bedpan, or urinal?: A Lot Help from another person bathing (including washing, rinsing, drying)?: A Lot Help from another person to put  on and taking off regular upper body clothing?: A Little Help from another person to put on and taking off regular lower body clothing?: A Lot 6 Click Score: 15    End of Session Equipment Utilized During Treatment: Gait belt;Other (comment) (w/c)  OT Visit Diagnosis: Unsteadiness on feet (R26.81);Other abnormalities of gait and mobility (R26.89);Muscle weakness (generalized) (M62.81);Hemiplegia and hemiparesis;Other symptoms and signs involving cognitive function Hemiplegia - Right/Left: Left Hemiplegia - dominant/non-dominant: Non-Dominant Hemiplegia - caused by: Cerebral infarction   Activity Tolerance Patient tolerated treatment well   Patient Left in bed;with call bell/phone within reach;with bed alarm set   Nurse Communication Mobility status;Other (comment) (may need nausea meds)        Time: 1001-1050 OT Time Calculation (min): 49 min  Charges: OT  General Charges $OT Visit: 1 Visit OT Treatments $Therapeutic Activity: 38-52 mins  Lenor Derrick., COTA/L Acute Rehabilitation Services [phone]    Barron Schmid 10/18/2020, 11:26 AM

## 2020-10-18 NOTE — Progress Notes (Signed)
  Speech Language Pathology Treatment: Cognitive-Linquistic  Patient Details Name: MALEKI HIPPE MRN: [id number] DOB: Aug 16, 1975 Today's Date: 10/18/2020 Time: 6945-0388 SLP Time Calculation (min) (ACUTE ONLY): 12 min  Assessment / Plan / Recommendation Clinical Impression  SLP followed up for cognitive linguistic intervention. Also attempted dysphagia intervention and offered upgraded POs however pt declined stating his stomach has been bothering him today. SLP directed for functional task of recall of splint precautions for left arm. Pt able to verbalize 2 appropriate facts independently, improving to 100 percent of precautions with written cues for visual reference that were posted at bedside. Pt exhibits impulsivity of speech at time and dysarthria impairing communication intelligibility at times. Reinforced slowed rate, over articulation, and pausing. SLP to continue to follow during acute stay.      HPI HPI: 45 y.o. RH-male with history of heart murmur who was admitted on 10/01/2020 with acute onset of left hemiparesis with sensory deficits,  right gaze preference and N/V.  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      SLP Plan  Continue with current plan of care       Recommendations  Diet recommendations: Dysphagia 3 (mechanical soft);Thin liquid Liquids provided via: Cup;Straw Medication Administration: Whole meds with liquid Supervision: Patient able to self feed;Staff to assist with self feeding;Full supervision/cueing for compensatory strategies Compensations: Lingual sweep for clearance of pocketing;Follow solids with liquid;Small sips/bites;Slow rate;Monitor for anterior loss;Minimize environmental distractions Postural Changes and/or Swallow Maneuvers: Seated upright 90 degrees                Oral Care Recommendations: Oral care BID Follow up Recommendations: 24  hour supervision/assistance;Skilled Nursing facility SLP Visit Diagnosis: 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

## 2020-10-18 NOTE — Progress Notes (Signed)
PT Cancellation Note  Patient Details Name: Harry [name] MRN: [id number] DOB: March 20, 1976   Cancelled Treatment:    Reason Eval/Treat Not Completed: (P) Patient declined, no reason specified Pt reports "I'm not feeling well", however is not able to articulate what is feeling badly. Then he states "I just don't feel like it" and turned over in bed. PT will follow back tomorrow.   Elon Alas Fleet 10/18/2020, 2:11 PM

## 2020-10-18 NOTE — Plan of Care (Signed)
No acute events  VSS, afebrile Neuro status intact Scheduled due meds administered Pt slept well overnight, resting comfortably Will continue to monitor and follow plan of care  Problem: Education: Goal: Knowledge of General Education information will improve Description: Including pain rating scale, medication(s)/side effects and non-pharmacologic comfort measures Outcome: Progressing   Problem: Health Behavior/Discharge Planning: Goal: Ability to manage health-related needs will improve Outcome: Progressing   Problem: Clinical Measurements: Goal: Ability to maintain clinical measurements within normal limits will improve Outcome: Progressing Goal: Will remain free from infection Outcome: Progressing Goal: Diagnostic test results will improve Outcome: Progressing Goal: Respiratory complications will improve Outcome: Progressing Goal: Cardiovascular complication will be avoided Outcome: Progressing   Problem: Activity: Goal: Risk for activity intolerance will decrease Outcome: Progressing   Problem: Nutrition: Goal: Adequate nutrition will be maintained Outcome: Progressing   Problem: Coping: Goal: Level of anxiety will decrease Outcome: Progressing   Problem: Elimination: Goal: Will not experience complications related to bowel motility Outcome: Progressing Goal: Will not experience complications related to urinary retention Outcome: Progressing   Problem: Pain Managment: Goal: General experience of comfort will improve Outcome: Progressing   Problem: Safety: Goal: Ability to remain free from injury will improve Outcome: Progressing   Problem: Skin Integrity: Goal: Risk for impaired skin integrity will decrease Outcome: Progressing   Problem: Education: Goal: Knowledge of disease or condition will improve Outcome: Progressing Goal: Knowledge of secondary prevention will improve Outcome: Progressing Goal: Knowledge of patient specific risk factors  addressed and post discharge goals established will improve Outcome: Progressing Goal: Individualized Educational Video(s) Outcome: Progressing

## 2020-10-19 LAB — BASIC METABOLIC PANEL
Anion gap: 8 (ref 5–15)
BUN: 12 mg/dL (ref 6–20)
CO2: 27 mmol/L (ref 22–32)
Calcium: 8.4 mg/dL — ABNORMAL LOW (ref 8.9–10.3)
Chloride: 101 mmol/L (ref 98–111)
Creatinine, Ser: 0.66 mg/dL (ref 0.61–1.24)
GFR, Estimated: >60 mL/min (ref >60)
Glucose, Bld: 96 mg/dL (ref 70–99)
Potassium: 3.4 mmol/L — ABNORMAL LOW (ref 3.5–5.1)
Sodium: 136 mmol/L (ref 135–145)

## 2020-10-19 LAB — CBC
HCT: 25.6 % — ABNORMAL LOW (ref 39.0–52.0)
Hemoglobin: 8.6 g/dL — ABNORMAL LOW (ref 13.0–17.0)
MCH: 29.2 pg (ref 26.0–34.0)
MCHC: 33.6 g/dL (ref 30.0–36.0)
MCV: 86.8 fL (ref 80.0–100.0)
Platelets: 471 10*3/uL — ABNORMAL HIGH (ref 150–400)
RBC: 2.95 MIL/uL — ABNORMAL LOW (ref 4.22–5.81)
RDW: 12.9 % (ref 11.5–15.5)
WBC: 5.8 10*3/uL (ref 4.0–10.5)
nRBC: 0 % (ref 0.0–0.2)

## 2020-10-19 MED ORDER — POTASSIUM CHLORIDE CRYS ER 20 MEQ PO TBCR
20.0000 meq | EXTENDED_RELEASE_TABLET | ORAL | Status: AC
  Administered 2020-10-19 (×2): 20 meq via ORAL
  Filled 2020-10-19 (×2): qty 1

## 2020-10-19 NOTE — Plan of Care (Signed)

## 2020-10-19 NOTE — Progress Notes (Signed)
Physical Therapy Treatment Patient Details Name: Harry [name] MRN: [id number] DOB: Dec 14, 1975 Today's Date: 10/19/2020    History of Present Illness 45 yo male presenting to ED with left sided weakness. CT showing early ischemic changes around right MCA but no hypodensity. tPA given on 4/18 @1823 . 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 Awaiting MRI. PMH including heart murmur not on medications.    PT Comments    Pt asleep on entry and requires increased stimulation to awake. Pt reports feeling so tired. PT with increased encouragement, however relays message that if pt does not start participating more he will lose the privilege of intensive therapy. Pt voices understanding and report that Dr came yesterday and reported that he thought pt would be able to walk again. Pt able to come to EoB with min guard. Focus of session on transfers and stepping in all directions. Pt able to come to standing using RW with min A for L UE support. Once in standing worked on weight shifts, and then progressed to stepping in all directions. Pt reports that he will work on moving his L side over the weekend and looks forward to working again on Monday.     Follow Up Recommendations  SNF     Equipment Recommendations  3in1 (PT);Wheelchair cushion (measurements PT);Wheelchair (measurements PT)       Precautions / Restrictions Precautions Precautions: Fall;Other (comment) Precaution Comments: LUE hemiparesis, subluxing LUE Required Braces or Orthoses: Other Brace (L cock up brace) Restrictions Weight Bearing Restrictions: No    Mobility  Bed Mobility Overal bed mobility: Needs Assistance Bed Mobility: Supine to Sit     Supine to sit: Min guard Sit to supine: Min guard   General bed mobility comments: min guard for safety with coming to EoB, pt requires increased cuing today, and increased time however able to come to EoB with good use of L LE and increased attention to L  UE    Transfers Overall transfer level: Needs assistance Equipment used: Rolling walker (2 wheeled) Transfers: Sit to/from Stand Sit to Stand: Min assist         General transfer comment: pt stood initially with heavy min A, found to be incontinent of stool, and sat back down. Pt able to stand again with minA and stand for ~3 min for pericare with light min A for steadying, pt able to stand two additional times with light min A mainly for management of L UE, pt requires increased time and cues for sequencing  Ambulation/Gait Ambulation/Gait assistance: Min assist Gait Distance (Feet): 2 Feet Assistive device: Rolling walker (2 wheeled) Gait Pattern/deviations: Step-to pattern;Decreased step length - right;Decreased step length - left;Decreased weight shift to left Gait velocity: decr Gait velocity interpretation: <1.31 ft/sec, indicative of household ambulator General Gait Details: min A for steadying at waist with stepping forward and back and lateral stepping L and R,      Modified Rankin (Stroke Patients Only) Modified Rankin (Stroke Patients Only) Pre-Morbid Rankin Score: No symptoms Modified Rankin: Moderately severe disability     Balance Overall balance assessment: Needs assistance Sitting-balance support: Feet supported;Single extremity supported Sitting balance-Leahy Scale: Fair Sitting balance - Comments: close guard for safety statically, requires light assist dynamically   Standing balance support: Bilateral upper extremity supported;During functional activity;Single extremity supported Standing balance-Leahy Scale: Poor Standing balance comment: min A for maintaining balance with R UE support  Cognition Arousal/Alertness: Lethargic Behavior During Therapy: Flat affect Overall Cognitive Status: Within Functional Limits for tasks assessed Area of Impairment: Following commands;Safety/judgement;Awareness;Attention;Problem  solving                   Current Attention Level: Selective Memory: Decreased short-term memory Following Commands: Follows one step commands with increased time;Follows multi-step commands with increased time Safety/Judgement: Decreased awareness of safety Awareness: Emergent Problem Solving: Slow processing;Difficulty sequencing;Requires verbal cues;Requires tactile cues General Comments: pt initially a sleep and does not rouse easily, however once awake, able to follow commands with slowed processing.      Exercises Other Exercises Other Exercises: stepping R and L x 5 Other Exercises: stepping forward and back x 3    General Comments General comments (skin integrity, edema, etc.): VSS, pt with increased independence with standing balance      Pertinent Vitals/Pain Pain Assessment: No/denies pain Faces Pain Scale: No hurt           PT Goals (current goals can now be found in the care plan section) Acute Rehab PT Goals Patient Stated Goal: none stated today PT Goal Formulation: With patient/family Time For Goal Achievement: 10/16/20 Potential to Achieve Goals: Good Progress towards PT goals: Progressing toward goals    Frequency    Min 5X/week      PT Plan Current plan remains appropriate       AM-PAC PT "6 Clicks" Mobility   Outcome Measure  Help needed turning from your back to your side while in a flat bed without using bedrails?: A Lot Help needed moving from lying on your back to sitting on the side of a flat bed without using bedrails?: A Lot Help needed moving to and from a bed to a chair (including a wheelchair)?: A Lot Help needed standing up from a chair using your arms (e.g., wheelchair or bedside chair)?: A Lot Help needed to walk in hospital room?: A Lot Help needed climbing 3-5 steps with a railing? : Total 6 Click Score: 11    End of Session Equipment Utilized During Treatment: Gait belt Activity Tolerance: Patient tolerated  treatment well Patient left: with call bell/phone within reach;with bed alarm set;in bed Nurse Communication: Mobility status PT Visit Diagnosis: Unsteadiness on feet (R26.81);Other abnormalities of gait and mobility (R26.89);Difficulty in walking, not elsewhere classified (R26.2);Hemiplegia and hemiparesis Hemiplegia - Right/Left: Left Hemiplegia - dominant/non-dominant: Non-dominant Hemiplegia - caused by: Cerebral infarction     Time: 3810-1751 PT Time Calculation (min) (ACUTE ONLY): 57 min  Charges:  $Gait Training: 8-22 mins $Therapeutic Exercise: 23-37 mins $Therapeutic Activity: 8-22 mins                     Raevyn Sokol B. Beverely Risen PT, DPT Acute Rehabilitation Services Pager [phone] Office [phone]    Elon Alas Fleet 10/19/2020, 1:44 PM

## 2020-10-19 NOTE — Progress Notes (Addendum)
STROKE TEAM PROGRESS NOTE   SUBJECTIVE (INTERVAL HISTORY) No family at the bedside.   Reports he feels his left numbness has lessened. Otherwise stable awaiting SNF in hard to place scenario. PT requested order to take patient outside which was provided.  OBJECTIVE Temp:  [98.3 F (36.8 C)-98.8 F (37.1 C)] 98.8 F (37.1 C) (05/06 0807) Pulse Rate:  [69-77] 77 (05/06 0807) Cardiac Rhythm: Normal sinus rhythm (05/06 0807) Resp:  [16-18] 16 (05/06 0807) BP: (118-147)/(76-90) 147/90 (05/06 0807) SpO2:  [99 %-100 %] 100 % (05/06 0807)  No results for input(s): GLUCAP in the last 168 hours. Recent Labs  Lab 10/13/20 0245 10/13/20 0745 10/15/20 0426 10/17/20 0421 10/19/20 0447  NA 138  --  140 140 136  K 2.7* 3.1* 3.3* 3.8 3.4*  CL 99  --  101 105 101  CO2 28  --  28 27 27   GLUCOSE 99  --  99 96 96  BUN 11  --  13 18 12   CREATININE 0.89  --  0.76 0.71 0.66  CALCIUM 7.8*  --  8.1* 8.1* 8.4*  MG  --  1.6* 1.7  --   --    Recent Labs  Lab 10/13/20 0245  AST 28  ALT 17  ALKPHOS 51  BILITOT 0.4  PROT 4.2*  ALBUMIN 1.4*   Recent Labs  Lab 10/13/20 0245 10/15/20 0426 10/17/20 0421 10/19/20 0447  WBC 6.9 7.3 6.7 5.8  HGB 8.9* 9.1* 8.7* 8.6*  HCT 26.4* 26.6* 25.7* 25.6*  MCV 85.7 85.0 85.7 86.8  PLT 463* 476* 462* 471*        Component Value Date/Time   CHOL 297 (H) 10/02/2020 0607   TRIG 62 10/02/2020 0607   HDL 73 10/02/2020 0607   CHOLHDL 4.1 10/02/2020 0607   VLDL 12 10/02/2020 0607   LDLCALC 212 (H) 10/02/2020 0607   Lab Results  Component Value Date   HGBA1C 5.3 10/02/2020      Component Value Date/Time   LABOPIA NONE DETECTED 10/05/2020 1321   COCAINSCRNUR NONE DETECTED 10/05/2020 1321   LABBENZ NONE DETECTED 10/05/2020 1321   AMPHETMU NONE DETECTED 10/05/2020 1321   THCU NONE DETECTED 10/05/2020 1321   LABBARB NONE DETECTED 10/05/2020 1321    No results for input(s): ETH in the last 168 hours.  MR Brain 1. Acute infarcts in the anterior and  posterior right frontal lobe. Associated edema with sulcal effacement. Mild curvilinear susceptibility artifact the regions of infarct likely represent petechial hemorrhage. No mass occupying acute hemorrhage. 2. Focal susceptibility artifact within a right M2 MCA branch in the region of the more posterior frontal lobe infarct, compatible with thrombus and known occlusion better characterized on recent CTA. 3. 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 4. Large right mastoid effusion  CTA 1. No pulmonary vascular malformation or acute intrathoracic abnormality. 2. Mild cardiomegaly  CT head 1. Acute infarcts in the anterior and posterior right frontal lobe. Associated edema with sulcal effacement. Mild curvilinear susceptibility artifact the regions of infarct likely represent petechial hemorrhage. No mass occupying acute hemorrhage. 2. Focal susceptibility artifact within a right M2 MCA branch in the region of the more posterior frontal lobe infarct, compatible with thrombus and known occlusion better characterized on recent CTA. 3. 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 4. Large right mastoid effusion  PHYSICAL EXAM  Temp:  [98.3 F (36.8 C)-98.8 F (37.1 C)] 98.8 F (37.1 C) (05/06 0807) Pulse Rate:  [69-77] 77 (05/06 0807) Resp:  [  16-18] 16 (05/06 0807) BP: (118-147)/(76-90) 147/90 (05/06 0807) SpO2:  [99 %-100 %] 100 % (05/06 0807)  General -frail middle-age African-American male not in distress Resp: No extra work of breathing Cardiovascular - Regular rhythm and rate.  Neuro - awake alert and oriented  to age, place, time and people. No aphasia, mild dysarthria but fluent language, following all simple commands. Able to name and repeat.  Mild dysarthria.  no gaze palsy now, but still more right gaze preference, visual field full no hemianopia, PERRL.  Left facial droop. Tongue protrusion to the left.  Right upper and lower extremity  at least 4/5, left upper extremity deltoid and bicep 3-/5  , tricep and finger movement 0/5, left lower extremity 3/5 proximal and distal PF/DF 1/5.  Sensation decreased on the left. Right finger-to-nose intact.  Gait not tested.    ASSESSMENT/PLAN Harry [name] is a 45 y.o. male with PMH of heart murmur not on medications presented to ER for acute onset left arm and leg weakness, left visual and sensory neglect, right gaze preference, left facial droop nausea/vomiting. tPA given.  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  Discussed with patient regarding risk and benefit of thrombectomy, patient declined thrombectomy.     Stroke: Right MCA infarct, embolic pattern, source unclear but likely cardioembolic (see below)  CT head early ischemic changes right MCA but no hypodensity  CTA head neck was done showed right M2/M3 occlusion.   MRI right moderate MCA cortical infarcts   2D Echo EF 40 to 45%  LE venous Doppler no DVT  TEE EF 35-40%, no clot but LA appendage with SEC and reduced LAA emptying velocity. positive for PFO  TCD bubble study minmal (Spencer degree 2 ) PFO  LE venous Doppler negative for DVT  LDL 212  HgbA1c 5.3  UDS negative  Lovenox for VTE prophylaxis  No antithrombotic prior to admission, now on aspirin 81mg  daily and clopidogrel 75 mg daily DAPT for 3 months and then aspirin alone given LVO intracranially.  Patient counseled to be compliant with his antithrombotic medications  Ongoing aggressive stroke risk factor management  Therapy recommendations: SNF   Disposition: SNF  Cardiomyopathy  EF 40 to 45%  TEE EF 35-40% with LAA SEC and reduced emptying velocity  Cardiology Dr. on board  CTA chest no pulmonary AVM  outpt follow up with Dr. Jacinto Halim  ?? PFO  TEE positive for bilateral shunting suggestive of PFO  TCD bubble study minmal (Spencer degree 2 ) PFO  CTA chest no pulmonary AVM  Follow up with Dr.  Jacinto Halim as outpt  Hypertensive urgency . Needed labetalol and Cleviprex IV before IV tPA for BP control . Stable on the high end . Off Cleviprex . On amlodipine 10   Long term BP goal normotensive  Hyperlipidemia  Home meds: None  LDL 212, goal < 70  Now on Lipitor 80  Continue statin at discharge  Dysphagia  Mild dysarthria  On dysphagia 2 and thin liquid  intermittent cough improving  Depressed mood  Dealing with new stroke deficits and reporting depressed mood  Less participation reported by therapy staff  Celexa initiated   Other Stroke Risk Factors  Hx of heart murmur  Klebsiella UTI  Leukocytosis WBC 10.9-> 8.1->7.4->6.5->10.4->13.8->6.9->7.3->6.7  Keflex 500mg  po q 12 hours x 5-7 days   Other Active Problems  Hypokalemia K 2.7-supplemented, recheck in am pending  Hospital day # 18 This plan of care was directed  by Dr. Everardo Pacific, NP-C  ATTENDING NOTE: I reviewed above note and agree with the assessment and plan. Pt was seen and examined.   Neuro stable unchanged. Encourage working hard with PT/OT or self for left hemiparesis.  Vitals and labs are stable.  Pending SNF  For detailed assessment and plan, please refer to above as I have made changes wherever appropriate.   Marvel Plan, MD PhD Stroke Neurology 10/20/2020 12:58 AM        To contact Stroke Continuity provider, please refer to [email or web address]. After hours, contact General Neurology

## 2020-10-19 NOTE — Progress Notes (Signed)
Occupational Therapy Treatment Patient Details Name: Harry [name] MRN: [id number] DOB: 11/08/1975 Today's Date: 10/19/2020    History of present illness 45 yo male presenting to ED with left sided weakness. CT showing early ischemic changes around right MCA but no hypodensity. tPA given on 4/18 @1823 . 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 Awaiting MRI. PMH including heart murmur not on medications.   OT comments  Pt making steady progress towards OT goals this session. Session focus on transfer training from EOB<>w/c, standing balance, BUE AAROM, and LLE therex. Pt currently requires MIN guard assist +1 to laterally scoot to w/c to pts R side but MIN A +1 to scoot to pts L side. Worked on standing balance with RW this session with able stand MOD A +1 with RW. Worked on shifting weight to L to march RLE with pt needing MOD A +1 for standing balance. Pt able to complete x2 stands with pt completing x10 reps of marching each trial. Additionally worked on April from sitting in w/c with OTA securing pts L hand to dowel rod to complete BUE therex as indicated below. Also worked on LLE quad activation with pt kicking ball back and forth with COTA with pt using LLE only. DC plan remains appropriate, will follow acutely per POC.    Follow Up Recommendations  SNF    Equipment Recommendations  3 in 1 bedside commode;Wheelchair (measurements OT);Wheelchair cushion (measurements OT)    Recommendations for Other Services      Precautions / Restrictions Precautions Precautions: Fall;Other (comment) Precaution Comments: LUE hemiparesis, subluxing LUE Required Braces or Orthoses: Other Brace Other Brace: L cock up wrist splint Restrictions Weight Bearing Restrictions: No       Mobility Bed Mobility Overal bed mobility: Needs Assistance Bed Mobility: Supine to Sit;Sit to Supine     Supine to sit: Min guard Sit to supine: Min guard   General bed mobility comments:  min guard for safety with bed mobility, increased time, effort and cues needed for pt to focus on task    Transfers Overall transfer level: Needs assistance Equipment used: Rolling walker (2 wheeled) Transfers: Sit to/from Stand;Lateral/Scoot Transfers Sit to Stand: Mod assist        Lateral/Scoot Transfers: Min assist;Min guard (minguard to R; MIN A to L) General transfer comment: pt completed lateral scoot transfer from EOB >w/c to pts R side with min guard assist, and MIN A +1 to scoot back to bed to pts L side. pt sit<>stand from w/c x2 with cues for hand placement and MOD A +1 to rise into standing, worked on stepping with RLE to achieve full WB on LLE pt able to complete x10 reps each stand before needing to sit, utilized mirror for visual feedback to improve body awareness    Balance Overall balance assessment: Needs assistance Sitting-balance support: Feet supported;Single extremity supported Sitting balance-Leahy Scale: Fair Sitting balance - Comments: close guard for safety statically, requires light assist dynamically   Standing balance support: During functional activity;Single extremity supported Standing balance-Leahy Scale: Poor Standing balance comment: min A for maintaining balance with R UE support                           ADL either performed or assessed with clinical judgement   ADL Overall ADL's : Needs assistance/impaired  Toilet Transfer: Charity fundraiser Details (indicate cue type and reason): simulated via lateral scoot from EOB>w/c going to pts R side with min guard assist +1, pt requires MIN A +1 to scoot back to bed d/t fatigue         Functional mobility during ADLs: Min guard;Minimal assistance;Moderate assistance (min guard - MIN A for lateral scoot transfer; MOD A for sit<>stand) General ADL Comments: session focus on transfer training from EOB<>w/c, standing balance, BUE  AAROM, and LLE therex     Vision       Perception     Praxis      Cognition Arousal/Alertness: Awake/alert Behavior During Therapy: Flat affect Overall Cognitive Status: Within Functional Limits for tasks assessed Area of Impairment: Following commands;Safety/judgement;Awareness;Attention;Problem solving;Memory                   Current Attention Level: Selective Memory: Decreased short-term memory (repeated cues for body awareness) Following Commands: Follows one step commands with increased time;Follows multi-step commands with increased time Safety/Judgement: Decreased awareness of safety;Decreased awareness of deficits Awareness: Emergent Problem Solving: Slow processing;Difficulty sequencing;Requires verbal cues;Requires tactile cues General Comments: pt initially a sleep and does not rouse easily, however once awake, able to follow commands with slowed processing.        Exercises General Exercises - Upper Extremity Shoulder Flexion: AAROM;Both;Seated;Limitations (with use of dowel) Shoulder Flexion Limitations: limited to ~ 90* Other Exercises Other Exercises: chest press AAROM with dowel rod x10 reps from sitting in w/c Other Exercises: shoulder circles AAROM with dowel rod x10 reps forward<>backward sitting in w/c Other Exercises: LLE LAQ with pt kicking back and forth to OTA with LLE from sitting in w/c Other Exercises: scapular elevation depression x5 reps LUE seated in w/c Other Exercises: elbow flexion <>extension LUE x5 reps seated in w/c   Shoulder Instructions       General Comments VSS, pt with increased independence with standing balance    Pertinent Vitals/ Pain       Pain Assessment: No/denies pain Faces Pain Scale: No hurt  Home Living                                          Prior Functioning/Environment              Frequency  Min 3X/week        Progress Toward Goals  OT Goals(current goals can now be  found in the care plan section)  Progress towards OT goals: Progressing toward goals  Acute Rehab OT Goals Patient Stated Goal: none stated today Time For Goal Achievement: 10/30/20 Potential to Achieve Goals: Good  Plan Discharge plan remains appropriate;Frequency remains appropriate    Co-evaluation                 AM-PAC OT "6 Clicks" Daily Activity     Outcome Measure   Help from another person eating meals?: A Little Help from another person taking care of personal grooming?: A Little Help from another person toileting, which includes using toliet, bedpan, or urinal?: A Lot Help from another person bathing (including washing, rinsing, drying)?: A Lot Help from another person to put on and taking off regular upper body clothing?: A Little Help from another person to put on and taking off regular lower body clothing?: A Lot 6 Click Score: 15    End of Session  Equipment Utilized During Treatment: Gait belt;Rolling walker;Other (comment) (w/c)  OT Visit Diagnosis: Unsteadiness on feet (R26.81);Other abnormalities of gait and mobility (R26.89);Muscle weakness (generalized) (M62.81);Hemiplegia and hemiparesis;Other symptoms and signs involving cognitive function Hemiplegia - Right/Left: Left Hemiplegia - dominant/non-dominant: Non-Dominant Hemiplegia - caused by: Cerebral infarction   Activity Tolerance Patient tolerated treatment well   Patient Left in bed;with call bell/phone within reach;with bed alarm set   Nurse Communication Mobility status        Time: 7017-7939 OT Time Calculation (min): 49 min  Charges: OT General Charges $OT Visit: 1 Visit OT Treatments $Therapeutic Activity: 38-52 mins  Lenor Derrick., COTA/L Acute Rehabilitation Services [phone]    Barron Schmid 10/19/2020, 4:41 PM

## 2020-10-20 LAB — BASIC METABOLIC PANEL
Anion gap: 5 (ref 5–15)
BUN: 15 mg/dL (ref 6–20)
CO2: 27 mmol/L (ref 22–32)
Calcium: 8.1 mg/dL — ABNORMAL LOW (ref 8.9–10.3)
Chloride: 105 mmol/L (ref 98–111)
Creatinine, Ser: 0.7 mg/dL (ref 0.61–1.24)
GFR, Estimated: >60 mL/min (ref >60)
Glucose, Bld: 105 mg/dL — ABNORMAL HIGH (ref 70–99)
Potassium: 3.6 mmol/L (ref 3.5–5.1)
Sodium: 137 mmol/L (ref 135–145)

## 2020-10-20 NOTE — Plan of Care (Signed)

## 2020-10-20 NOTE — Progress Notes (Addendum)
STROKE TEAM PROGRESS NOTE   SUBJECTIVE (INTERVAL HISTORY) No family at the bedside.   Reports he feels his left numbness has lessened. Otherwise stable awaiting SNF in hard to place scenario. PT requested order to take patient outside which was provided.   OBJECTIVE Temp:  [98.1 F (36.7 C)-99.3 F (37.4 C)] 98.7 F (37.1 C) (05/07 0842) Pulse Rate:  [72-82] 77 (05/07 0842) Cardiac Rhythm: Normal sinus rhythm;Heart block (05/07 0700) Resp:  [16-18] 16 (05/07 0842) BP: (123-144)/(78-89) 131/80 (05/07 0842) SpO2:  [97 %-100 %] 99 % (05/07 0842)  No results for input(s): GLUCAP in the last 168 hours. Recent Labs  Lab 10/15/20 0426 10/17/20 0421 10/19/20 0447 10/20/20 0152  NA 140 140 136 137  K 3.3* 3.8 3.4* 3.6  CL 101 105 101 105  CO2 28 27 27 27   GLUCOSE 99 96 96 105*  BUN 13 18 12 15   CREATININE 0.76 0.71 0.66 0.70  CALCIUM 8.1* 8.1* 8.4* 8.1*  MG 1.7  --   --   --    No results for input(s): AST, ALT, ALKPHOS, BILITOT, PROT, ALBUMIN in the last 168 hours. Recent Labs  Lab 10/15/20 0426 10/17/20 0421 10/19/20 0447  WBC 7.3 6.7 5.8  HGB 9.1* 8.7* 8.6*  HCT 26.6* 25.7* 25.6*  MCV 85.0 85.7 86.8  PLT 476* 462* 471*        Component Value Date/Time   CHOL 297 (H) 10/02/2020 0607   TRIG 62 10/02/2020 0607   HDL 73 10/02/2020 0607   CHOLHDL 4.1 10/02/2020 0607   VLDL 12 10/02/2020 0607   LDLCALC 212 (H) 10/02/2020 0607   Lab Results  Component Value Date   HGBA1C 5.3 10/02/2020      Component Value Date/Time   LABOPIA NONE DETECTED 10/05/2020 1321   COCAINSCRNUR NONE DETECTED 10/05/2020 1321   LABBENZ NONE DETECTED 10/05/2020 1321   AMPHETMU NONE DETECTED 10/05/2020 1321   THCU NONE DETECTED 10/05/2020 1321   LABBARB NONE DETECTED 10/05/2020 1321    No results for input(s): ETH in the last 168 hours.  MR Brain 1. Acute infarcts in the anterior and posterior right frontal lobe. Associated edema with sulcal effacement. Mild  curvilinear susceptibility artifact the regions of infarct likely represent petechial hemorrhage. No mass occupying acute hemorrhage. 2. Focal susceptibility artifact within a right M2 MCA branch in the region of the more posterior frontal lobe infarct, compatible with thrombus and known occlusion better characterized on recent CTA. 3. 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 4. Large right mastoid effusion  CTA 1. No pulmonary vascular malformation or acute intrathoracic abnormality. 2. Mild cardiomegaly  CT head 1. Acute infarcts in the anterior and posterior right frontal lobe. Associated edema with sulcal effacement. Mild curvilinear susceptibility artifact the regions of infarct likely represent petechial hemorrhage. No mass occupying acute hemorrhage. 2. Focal susceptibility artifact within a right M2 MCA branch in the region of the more posterior frontal lobe infarct, compatible with thrombus and known occlusion better characterized on recent CTA. 3. 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 4. Large right mastoid effusion  PHYSICAL EXAM  Temp:  [98.1 F (36.7 C)-99.3 F (37.4 C)] 98.7 F (37.1 C) (05/07 0842) Pulse Rate:  [72-82] 77 (05/07 0842) Resp:  [16-18] 16 (05/07 0842) BP: (123-144)/(78-89) 131/80 (05/07 0842) SpO2:  [97 %-100 %] 99 % (05/07 0842)  General -frail middle-age African-American male not in distress Resp: No extra work of breathing Cardiovascular - Regular rhythm and rate.  Neuro - awake alert and oriented  to age, place, time and people. No aphasia, mild dysarthria but fluent language, following all simple commands. Able to name and repeat.  Mild dysarthria.  no gaze palsy now, but still more right gaze preference, visual field full no hemianopia, PERRL.  Left facial droop. Tongue protrusion to the left.  Right upper and lower extremity at least 4/5, left upper extremity deltoid and bicep 3-/5  , tricep and finger  movement 0/5, left lower extremity 3/5 proximal and distal PF/DF 1/5.  Sensation decreased on the left. Right finger-to-nose intact.  Gait not tested.    ASSESSMENT/PLAN [name] is a 45 y.o. male with PMH of heart murmur not on medications presented to ER for acute onset left arm and leg weakness, left visual and sensory neglect, right gaze preference, left facial droop nausea/vomiting. tPA given.  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  Discussed with patient regarding risk and benefit of thrombectomy, patient declined thrombectomy.     Stroke: Right MCA infarct, embolic pattern, source unclear but likely cardioembolic (see below)  CT head early ischemic changes right MCA but no hypodensity  CTA head neck was done showed right M2/M3 occlusion.   MRI right moderate MCA cortical infarcts   2D Echo EF 40 to 45%  LE venous Doppler no DVT  TEE EF 35-40%, no clot but LA appendage with SEC and reduced LAA emptying velocity. positive for PFO  TCD bubble study minmal (Spencer degree 2 ) PFO  LE venous Doppler negative for DVT  LDL 212  HgbA1c 5.3  UDS negative  Lovenox for VTE prophylaxis  No antithrombotic prior to admission, now on aspirin 81mg  daily and clopidogrel 75 mg daily DAPT for 3 months and then aspirin alone given LVO intracranially.  Patient counseled to be compliant with his antithrombotic medications  Ongoing aggressive stroke risk factor management  Therapy recommendations: SNF   Disposition: SNF  Cardiomyopathy  EF 40 to 45%  TEE EF 35-40% with LAA SEC and reduced emptying velocity  Cardiology Dr. on board  CTA chest no pulmonary AVM  outpt follow up with Dr. Jacinto Halim  ?? PFO  TEE positive for bilateral shunting suggestive of PFO  TCD bubble study minmal (Spencer degree 2 ) PFO  CTA chest no pulmonary AVM  Follow up with Dr. Jacinto Halim as outpt  Hypertensive urgency . Needed labetalol and Cleviprex IV  before IV tPA for BP control . Stable on the high end . Off Cleviprex . On amlodipine 10   Long term BP goal normotensive  Hyperlipidemia  Home meds: None  LDL 212, goal < 70  Now on Lipitor 80  Continue statin at discharge  Dysphagia  Mild dysarthria  On dysphagia 2 and thin liquid  intermittent cough improving  Depressed mood  Dealing with new stroke deficits and reporting depressed mood  Less participation reported by therapy staff  Celexa initiated   Other Stroke Risk Factors  Hx of heart murmur  Klebsiella UTI  Leukocytosis now 5.8   Other Active Problems   Hospital day # 39   12, Suzan Garibaldi        To contact Stroke Continuity provider, please refer to [place]. After hours, contact General Neurology

## 2020-10-21 NOTE — Progress Notes (Signed)
STROKE TEAM PROGRESS NOTE   SUBJECTIVE (INTERVAL HISTORY) No family at the bedside.   Reports he feels his left numbness has lessened. Otherwise stable awaiting SNF in hard to place scenario. PT requested order to take patient outside which was provided.  He reports that he is doing about the same although overall slowly better.     OBJECTIVE Temp:  [98.3 F (36.8 C)-98.9 F (37.2 C)] 98.7 F (37.1 C) (05/08 0352) Pulse Rate:  [73-82] 80 (05/08 0352) Cardiac Rhythm: Normal sinus rhythm (05/07 2143) Resp:  [16-18] 18 (05/08 0352) BP: (119-146)/(77-99) 145/92 (05/08 0352) SpO2:  [97 %-100 %] 99 % (05/08 0352)  No results for input(s): GLUCAP in the last 168 hours. Recent Labs  Lab 10/15/20 0426 10/17/20 0421 10/19/20 0447 10/20/20 0152  NA 140 140 136 137  K 3.3* 3.8 3.4* 3.6  CL 101 105 101 105  CO2 28 27 27 27   GLUCOSE 99 96 96 105*  BUN 13 18 12 15   CREATININE 0.76 0.71 0.66 0.70  CALCIUM 8.1* 8.1* 8.4* 8.1*  MG 1.7  --   --   --    No results for input(s): AST, ALT, ALKPHOS, BILITOT, PROT, ALBUMIN in the last 168 hours. Recent Labs  Lab 10/15/20 0426 10/17/20 0421 10/19/20 0447  WBC 7.3 6.7 5.8  HGB 9.1* 8.7* 8.6*  HCT 26.6* 25.7* 25.6*  MCV 85.0 85.7 86.8  PLT 476* 462* 471*        Component Value Date/Time   CHOL 297 (H) 10/02/2020 0607   TRIG 62 10/02/2020 0607   HDL 73 10/02/2020 0607   CHOLHDL 4.1 10/02/2020 0607   VLDL 12 10/02/2020 0607   LDLCALC 212 (H) 10/02/2020 0607   Lab Results  Component Value Date   HGBA1C 5.3 10/02/2020      Component Value Date/Time   LABOPIA NONE DETECTED 10/05/2020 1321   COCAINSCRNUR NONE DETECTED 10/05/2020 1321   LABBENZ NONE DETECTED 10/05/2020 1321   AMPHETMU NONE DETECTED 10/05/2020 1321   THCU NONE DETECTED 10/05/2020 1321   LABBARB NONE DETECTED 10/05/2020 1321    No results for input(s): ETH in the last 168 hours.  MR Brain 1. Acute infarcts in the anterior and posterior right frontal  lobe. Associated edema with sulcal effacement. Mild curvilinear susceptibility artifact the regions of infarct likely represent petechial hemorrhage. No mass occupying acute hemorrhage. 2. Focal susceptibility artifact within a right M2 MCA branch in the region of the more posterior frontal lobe infarct, compatible with thrombus and known occlusion better characterized on recent CTA. 3. 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 4. Large right mastoid effusion  CTA 1. No pulmonary vascular malformation or acute intrathoracic abnormality. 2. Mild cardiomegaly  CT head 1. Acute infarcts in the anterior and posterior right frontal lobe. Associated edema with sulcal effacement. Mild curvilinear susceptibility artifact the regions of infarct likely represent petechial hemorrhage. No mass occupying acute hemorrhage. 2. Focal susceptibility artifact within a right M2 MCA branch in the region of the more posterior frontal lobe infarct, compatible with thrombus and known occlusion better characterized on recent CTA. 3. 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 4. Large right mastoid effusion  PHYSICAL EXAM  Temp:  [98.3 F (36.8 C)-98.9 F (37.2 C)] 98.7 F (37.1 C) (05/08 0352) Pulse Rate:  [73-82] 80 (05/08 0352) Resp:  [16-18] 18 (05/08 0352) BP: (119-146)/(77-99) 145/92 (05/08 0352) SpO2:  [97 %-100 %] 99 % (05/08 0352)  General -frail middle-age African-American male not  in distress Resp: No extra work of breathing Cardiovascular - Regular rhythm and rate.  Neuro - awake alert and oriented  to age, place, time and people. No aphasia, mild dysarthria but fluent language, following all simple commands. Able to name and repeat.  Mild dysarthria.  no gaze palsy now, but still more right gaze preference, visual field full no hemianopia, PERRL.  Left facial droop. Tongue protrusion to the left.  Right upper and lower extremity at least 4/5, left upper  extremity deltoid and bicep 3-/5  , tricep and finger movement 0/5, left lower extremity 3/5 proximal and distal PF/DF 1/5.  Sensation decreased on the left. Right finger-to-nose intact.  Gait not tested.    ASSESSMENT/PLAN [name] is a 45 y.o. male with PMH of heart murmur not on medications presented to ER for acute onset left arm and leg weakness, left visual and sensory neglect, right gaze preference, left facial droop nausea/vomiting. tPA given.  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  Discussed with patient regarding risk and benefit of thrombectomy, patient declined thrombectomy.     Stroke: Right MCA infarct, embolic pattern, source unclear but likely cardioembolic (see below)  CT head early ischemic changes right MCA but no hypodensity  CTA head neck was done showed right M2/M3 occlusion.   MRI right moderate MCA cortical infarcts   2D Echo EF 40 to 45%  LE venous Doppler no DVT  TEE EF 35-40%, no clot but LA appendage with SEC and reduced LAA emptying velocity. positive for PFO  TCD bubble study minmal (Spencer degree 2 ) PFO  LE venous Doppler negative for DVT  LDL 212  HgbA1c 5.3  UDS negative  Lovenox for VTE prophylaxis  No antithrombotic prior to admission, now on aspirin 81mg  daily and clopidogrel 75 mg daily DAPT for 3 months and then aspirin alone given LVO intracranially.  Patient counseled to be compliant with his antithrombotic medications  Ongoing aggressive stroke risk factor management  Therapy recommendations: SNF   Disposition: SNF  Cardiomyopathy  EF 40 to 45%  TEE EF 35-40% with LAA SEC and reduced emptying velocity  Cardiology Dr. on board  CTA chest no pulmonary AVM  outpt follow up with Dr. Jacinto Halim  ?? PFO  TEE positive for bilateral shunting suggestive of PFO  TCD bubble study minmal (Spencer degree 2 ) PFO  CTA chest no pulmonary AVM  Follow up with Dr. Jacinto Halim as  outpt  Hypertensive urgency . Needed labetalol and Cleviprex IV before IV tPA for BP control . Stable on the high end . Off Cleviprex . On amlodipine 10   Long term BP goal normotensive  Hyperlipidemia  Home meds: None  LDL 212, goal < 70  Now on Lipitor 80  Continue statin at discharge  Dysphagia  Mild dysarthria  On dysphagia 2 and thin liquid  intermittent cough improving  Depressed mood  Dealing with new stroke deficits and reporting depressed mood  Less participation reported by therapy staff  Celexa initiated   Other Stroke Risk Factors  Hx of heart murmur  Klebsiella UTI  Leukocytosis now 5.8   Other Active Problems   Hospital day # 20         To contact Stroke Continuity provider, please refer to Jacinto Halim. After hours, contact General Neurology

## 2020-10-21 NOTE — Plan of Care (Signed)

## 2020-10-22 LAB — BASIC METABOLIC PANEL
Anion gap: 8 (ref 5–15)
BUN: 12 mg/dL (ref 6–20)
CO2: 28 mmol/L (ref 22–32)
Calcium: 8.3 mg/dL — ABNORMAL LOW (ref 8.9–10.3)
Chloride: 103 mmol/L (ref 98–111)
Creatinine, Ser: 0.71 mg/dL (ref 0.61–1.24)
GFR, Estimated: >60 mL/min (ref >60)
Glucose, Bld: 91 mg/dL (ref 70–99)
Potassium: 3.2 mmol/L — ABNORMAL LOW (ref 3.5–5.1)
Sodium: 139 mmol/L (ref 135–145)

## 2020-10-22 LAB — CBC
HCT: 25.2 % — ABNORMAL LOW (ref 39.0–52.0)
Hemoglobin: 8.5 g/dL — ABNORMAL LOW (ref 13.0–17.0)
MCH: 29.1 pg (ref 26.0–34.0)
MCHC: 33.7 g/dL (ref 30.0–36.0)
MCV: 86.3 fL (ref 80.0–100.0)
Platelets: 406 10*3/uL — ABNORMAL HIGH (ref 150–400)
RBC: 2.92 MIL/uL — ABNORMAL LOW (ref 4.22–5.81)
RDW: 13 % (ref 11.5–15.5)
WBC: 4.7 10*3/uL (ref 4.0–10.5)
nRBC: 0 % (ref 0.0–0.2)

## 2020-10-22 NOTE — Progress Notes (Signed)
STROKE TEAM PROGRESS NOTE   SUBJECTIVE (INTERVAL HISTORY) Awaiting placement at SNF in difficult to place scenario Therapy team is at the bedside reporting some progress, trying to arrange a wheelchair. They are seeing him 5 days a week.  No visitors at bedside. Harry [name] does not have new concerns or complaints today. He is stable both neurologically and hemodynamically. He is awaiting SNF placement for a couple of weeks now due to lack of insurance/financial resources.   Neurological exam is unchanged.  Vital signs are stable.  OBJECTIVE Temp:  [97.4 F (36.3 C)-99.1 F (37.3 C)] 97.4 F (36.3 C) (05/09 0828) Pulse Rate:  [75-87] 75 (05/09 1218) Cardiac Rhythm: Normal sinus rhythm (05/09 0700) Resp:  [18-20] 18 (05/09 1218) BP: (124-152)/(87-92) 126/87 (05/09 1218) SpO2:  [95 %-100 %] 99 % (05/09 1218)  No results for input(s): GLUCAP in the last 168 hours. Recent Labs  Lab 10/17/20 0421 10/19/20 0447 10/20/20 0152 10/22/20 0752  NA 140 136 137 139  K 3.8 3.4* 3.6 3.2*  CL 105 101 105 103  CO2 27 27 27 28   GLUCOSE 96 96 105* 91  BUN 18 12 15 12   CREATININE 0.71 0.66 0.70 0.71  CALCIUM 8.1* 8.4* 8.1* 8.3*   No results for input(s): AST, ALT, ALKPHOS, BILITOT, PROT, ALBUMIN in the last 168 hours. Recent Labs  Lab 10/17/20 0421 10/19/20 0447 10/22/20 0752  WBC 6.7 5.8 4.7  HGB 8.7* 8.6* 8.5*  HCT 25.7* 25.6* 25.2*  MCV 85.7 86.8 86.3  PLT 462* 471* 406*        Component Value Date/Time   CHOL 297 (H) 10/02/2020 0607   TRIG 62 10/02/2020 0607   HDL 73 10/02/2020 0607   CHOLHDL 4.1 10/02/2020 0607   VLDL 12 10/02/2020 0607   LDLCALC 212 (H) 10/02/2020 0607   Lab Results  Component Value Date   HGBA1C 5.3 10/02/2020      Component Value Date/Time   LABOPIA NONE DETECTED 10/05/2020 1321   COCAINSCRNUR NONE DETECTED 10/05/2020 1321   LABBENZ NONE DETECTED 10/05/2020 1321   AMPHETMU NONE DETECTED 10/05/2020 1321   THCU NONE DETECTED 10/05/2020 1321    LABBARB NONE DETECTED 10/05/2020 1321    No results for input(s): ETH in the last 168 hours.  MR Brain 1. Acute infarcts in the anterior and posterior right frontal lobe. Associated edema with sulcal effacement. Mild curvilinear susceptibility artifact the regions of infarct likely represent petechial hemorrhage. No mass occupying acute hemorrhage. 2. Focal susceptibility artifact within a right M2 MCA branch in the region of the more posterior frontal lobe infarct, compatible with thrombus and known occlusion better characterized on recent CTA. 3. 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 4. Large right mastoid effusion  CTA 1. No pulmonary vascular malformation or acute intrathoracic abnormality. 2. Mild cardiomegaly  CT head 1. Acute infarcts in the anterior and posterior right frontal lobe. Associated edema with sulcal effacement. Mild curvilinear susceptibility artifact the regions of infarct likely represent petechial hemorrhage. No mass occupying acute hemorrhage. 2. Focal susceptibility artifact within a right M2 MCA branch in the region of the more posterior frontal lobe infarct, compatible with thrombus and known occlusion better characterized on recent CTA. 3. 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 4. Large right mastoid effusion  PHYSICAL EXAM  Temp:  [97.4 F (36.3 C)-99.1 F (37.3 C)] 97.4 F (36.3 C) (05/09 0828) Pulse Rate:  [75-87] 75 (05/09 1218) Resp:  [18-20] 18 (05/09 1218) BP: (124-152)/(87-92) 126/87 (  05/09 1218) SpO2:  [95 %-100 %] 99 % (05/09 1218)  General -frail middle-age African-American male not in distress Resp: No extra work of breathing Cardiovascular - Regular rhythm and rate.  Neuro - awake alert and oriented  to age, place, time and people. No aphasia, mild dysarthria but fluent language, following all simple commands. Able to name and repeat.  Mild dysarthria.  no gaze palsy now, but still more  right gaze preference, visual field full no hemianopia, PERRL.  Left facial droop. Tongue protrusion to the left.  Right upper and lower extremity at least 4/5, left upper extremity deltoid and bicep 3-/5  , tricep and finger movement 0/5, left lower extremity 3/5 proximal and distal PF/DF 1/5.  Sensation decreased on the left. Right finger-to-nose intact.  Gait not tested.    ASSESSMENT/PLAN Harry [name] is a 45 y.o. male with PMH of heart murmur not on medications presented to ER for acute onset left arm and leg weakness, left visual and sensory neglect, right gaze preference, left facial droop nausea/vomiting. tPA given.  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  Discussed with patient regarding risk and benefit of thrombectomy, patient declined thrombectomy.     Stroke: Right MCA infarct, embolic pattern, source unclear but likely cardioembolic (see below)  CT head early ischemic changes right MCA but no hypodensity  CTA head neck was done showed right M2/M3 occlusion.   MRI right moderate MCA cortical infarcts   2D Echo EF 40 to 45%  LE venous Doppler no DVT  TEE EF 35-40%, no clot but LA appendage with SEC and reduced LAA emptying velocity. positive for PFO  TCD bubble study minmal (Spencer degree 2 ) PFO  LE venous Doppler negative for DVT  LDL 212  HgbA1c 5.3  UDS negative  Lovenox for VTE prophylaxis  No antithrombotic prior to admission, now on aspirin 81mg  daily and clopidogrel 75 mg daily DAPT for 3 months and then aspirin alone given LVO intracranially.  Patient counseled to be compliant with his antithrombotic medications  Ongoing aggressive stroke risk factor management  Therapy recommendations: SNF   Disposition: SNF  Cardiomyopathy  EF 40 to 45%  TEE EF 35-40% with LAA SEC and reduced emptying velocity  Cardiology Dr. on board  CTA chest no pulmonary AVM  outpt follow up with Dr. Jacinto Harry [name]  ?? PFO  TEE positive  for bilateral shunting suggestive of PFO  TCD bubble study minmal (Spencer degree 2 ) PFO  CTA chest no pulmonary AVM  Follow up with Dr. Jacinto Harry [name] as outpt  Hypertensive urgency . Needed labetalol and Cleviprex IV before IV tPA for BP control . Stable on the high end . Off Cleviprex . On amlodipine 10   Long term BP goal normotensive  Hyperlipidemia  Home meds: None  LDL 212, goal < 70  Now on Lipitor 80  Continue statin at discharge  Dysphagia  Mild dysarthria  On dysphagia 2 and thin liquid  intermittent cough improving  Depressed mood  Dealing with new stroke deficits and reporting depressed mood  Less participation reported by therapy staff  Celexa initiated   Other Stroke Risk Factors  Hx of heart murmur  Klebsiella UTI  Leukocytosis now 5.8   Other Active Problems   Hospital day # 21  Continue ongoing therapies.  Transfer to skilled nursing facility when bed available.  Greater than 50% time during this 25-minute visit was spent in counseling and coordination of care and  discussion with care team.  Delia Heady MD       To contact Stroke Continuity provider, please refer to [email or web address]. After hours, contact General Neurology

## 2020-10-22 NOTE — Progress Notes (Signed)
Occupational Therapy Treatment Patient Details Name: Harry [name] MRN: [id number] DOB: Feb 22, 1976 Today's Date: 10/22/2020    History of present illness 45 yo male presenting to ED with left sided weakness. CT showing early ischemic changes around right MCA but no hypodensity. tPA given on 4/18 @1823 . 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 Awaiting MRI. PMH including heart murmur not on medications.   OT comments  Pt making steady progress towards OT goals this session. Session focus on standing tolerance and functional ambulation with L platform RW. Pt noted to be soiled upon arrival, but able to stand from EOB with L platform with MOD A +1 while tech assisted pt with posterior pericare.  Pt able to ambulate ~ 46 ft with L platform RW with MOD A +2 with close chair follow, pt needing assist to activate L quad during functional gait. Pt required seated rest break in between bouts. Pt requires step by step cues during ambulation to sequence managing RW and stepping BLEs. Pt additionally able to stand at sink ~ 3 mins to complete standing ADLs with LUE acting as gross assist, however repeated cues needed to incorporate LUE into ADLs. Pt with episode of nausea at end of session with pt noted to dry heave- alerted RN. Pt able to laterally scoot back to bed from w/c with MIN A +1. Pt would continue to benefit from skilled occupational therapy while admitted and after d/c to address the below listed limitations in order to improve overall functional mobility and facilitate independence with BADL participation. DC plan remains appropriate, will follow acutely per POC.     Follow Up Recommendations  SNF    Equipment Recommendations  3 in 1 bedside commode;Wheelchair (measurements OT);Wheelchair cushion (measurements OT)    Recommendations for Other Services      Precautions / Restrictions Precautions Precautions: Fall;Other (comment) Precaution Comments: LUE hemiparesis,  subluxing LUE ( K tape to LUE) Required Braces or Orthoses: Other Brace Other Brace: L cock up wrist splint Restrictions Weight Bearing Restrictions: No       Mobility Bed Mobility Overal bed mobility: Needs Assistance Bed Mobility: Supine to Sit;Sit to Supine     Supine to sit: Min guard;HOB elevated Sit to supine: Min assist   General bed mobility comments: MINA to return back to bed d/t nausea    Transfers Overall transfer level: Needs assistance Equipment used: Left platform walker;1 person hand held assist Transfers: Sit to/from Stand;Lateral/Scoot Transfers Sit to Stand: Min assist;Mod assist Stand pivot transfers: Min assist      Lateral/Scoot Transfers: Min assist General transfer comment: pt completed multiple sit<>stands at sink and with L platform RW, cues needed each trial for hand placement with pt needing up to MOD A as pt fatigued. pt able to laterally scoot to w/c from EOB with MIN A +1 to R and L side this session    Balance Overall balance assessment: Needs assistance Sitting-balance support: Feet supported;Single extremity supported Sitting balance-Leahy Scale: Fair     Standing balance support: During functional activity;Single extremity supported Standing balance-Leahy Scale: Poor Standing balance comment: min A for maintaining balance with R UE support                           ADL either performed or assessed with clinical judgement   ADL Overall ADL's : Needs assistance/impaired     Grooming: Wash/dry face;Standing;Minimal assistance Grooming Details (indicate cue type and reason): MIN  A +2 for standing grooming tasks at sink, pt requries cues to incorporate LUE into ADLs and needs assist to support LLE and maintain upright midline posture     Lower Body Bathing: Total assistance;Sit to/from stand Lower Body Bathing Details (indicate cue type and reason): pt completed sit<>stand from EOB with MOD A +1 with tech completing  posterior pericare in standing with pt needing total A for clealiness Upper Body Dressing : Minimal assistance;Sitting Upper Body Dressing Details (indicate cue type and reason): pt able to place LUE in sleeve as gown using RUE as assist     Toilet Transfer: Moderate assistance;+2 for physical assistance;RW;Ambulation (L platform RW) Toilet Transfer Details (indicate cue type and reason): simulated via functional mobility with pt able to ambualte ~ 46 ft with L platform RW and MOD A +2 to assist with WB thru LLE and and managing RW Toileting- Clothing Manipulation and Hygiene: Total assistance;Sit to/from stand Toileting - Clothing Manipulation Details (indicate cue type and reason): psoterior pericare in standing     Functional mobility during ADLs: Moderate assistance;+2 for physical assistance;Rolling walker (L platform RW) General ADL Comments: session focus on functional sit<>stands, functional ambulation with L platform RW, and standing tolerance     Vision       Perception     Praxis      Cognition Arousal/Alertness: Awake/alert Behavior During Therapy: Flat affect Overall Cognitive Status: Impaired/Different from baseline Area of Impairment: Following commands;Safety/judgement;Awareness;Attention;Problem solving;Memory                   Current Attention Level: Selective Memory: Decreased short-term memory (repeated cues for body mechanics) Following Commands: Follows one step commands with increased time;Follows multi-step commands with increased time Safety/Judgement: Decreased awareness of safety;Decreased awareness of deficits Awareness: Emergent Problem Solving: Slow processing;Difficulty sequencing;Requires verbal cues;Requires tactile cues General Comments: pt requires max multimodal cuing for attention to LUE/LE        Exercises Other Exercises Other Exercises: LUE horizontal ABD/ ADD with hand over hand assist in standing   Shoulder Instructions        General Comments reapplied K tape to LUE    Pertinent Vitals/ Pain       Pain Assessment: Faces Faces Pain Scale: Hurts little more Pain Location: general discomfort from feeling nauseous Pain Descriptors / Indicators: Discomfort;Grimacing;Other (Comment) (dry heaving) Pain Intervention(s): Monitored during session;Other (comment) (alerted RN about nausea and maybe giving nausea meds)  Home Living                                          Prior Functioning/Environment              Frequency  Min 3X/week        Progress Toward Goals  OT Goals(current goals can now be found in the care plan section)  Progress towards OT goals: Progressing toward goals  Acute Rehab OT Goals Patient Stated Goal: walk more OT Goal Formulation: With patient Time For Goal Achievement: 10/30/20 Potential to Achieve Goals: Good  Plan Discharge plan remains appropriate;Frequency remains appropriate    Co-evaluation                 AM-PAC OT "6 Clicks" Daily Activity     Outcome Measure   Help from another person eating meals?: A Little Help from another person taking care of personal grooming?: A Little Help from another  person toileting, which includes using toliet, bedpan, or urinal?: A Lot Help from another person bathing (including washing, rinsing, drying)?: A Lot Help from another person to put on and taking off regular upper body clothing?: A Little Help from another person to put on and taking off regular lower body clothing?: A Lot 6 Click Score: 15    End of Session Equipment Utilized During Treatment: Gait belt;Rolling walker;Other (comment) (L platform RW; w/c)  OT Visit Diagnosis: Unsteadiness on feet (R26.81);Other abnormalities of gait and mobility (R26.89);Muscle weakness (generalized) (M62.81);Hemiplegia and hemiparesis;Other symptoms and signs involving cognitive function Hemiplegia - Right/Left: Left Hemiplegia - dominant/non-dominant:  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

## 2020-10-22 NOTE — Plan of Care (Signed)
  Problem: Ischemic Stroke/TIA Tissue Perfusion: Goal: Complications of ischemic stroke/TIA will be minimized 10/22/2020 1742 by Melvenia Needles, RN Outcome: Progressing 10/22/2020 1207 by Melvenia Needles, RN Outcome: Progressing

## 2020-10-22 NOTE — Plan of Care (Signed)
  Problem: Clinical Measurements: Goal: Ability to maintain clinical measurements within normal limits will improve Outcome: Progressing Goal: Will remain free from infection Outcome: Progressing Goal: Diagnostic test results will improve Outcome: Progressing Goal: Respiratory complications will improve Outcome: Progressing Goal: Cardiovascular complication will be avoided Outcome: Progressing   Problem: Safety: Goal: Ability to remain free from injury will improve Outcome: Progressing   Problem: Ischemic Stroke/TIA Tissue Perfusion: Goal: Complications of ischemic stroke/TIA will be minimized Outcome: Progressing   

## 2020-10-22 NOTE — Progress Notes (Signed)
Physical Therapy Treatment Patient Details Name: Harry [name] MRN: [id number] DOB: 21-Sep-1975 Today's Date: 10/22/2020    History of Present Illness 45 yo male presenting to ED with left sided weakness. CT showing early ischemic changes around right MCA but no hypodensity. tPA given on 4/18 @1823 . 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 Awaiting MRI. PMH including heart murmur not on medications.    PT Comments    Pt eager to walk today, states "the doctors think I can get back to walking". Pt tolerating increasing mobility today, propelling self in w/c with min assist for LLE facilitation and steering; repeated sit<>stands, and repeated short hallway-distance gait using RUE on handrail in hallway. PT providing max cuing throughout for increasing L foot clearance and proper placement LLE during gait aided by use of external target cuing. Pt making great progress, will continue to follow acutely.     Follow Up Recommendations  SNF     Equipment Recommendations  3in1 (PT);Wheelchair cushion (measurements PT);Wheelchair (measurements PT)    Recommendations for Other Services       Precautions / Restrictions Precautions Precautions: Fall;Other (comment) Precaution Comments: LUE hemiparesis, subluxing LUE    Mobility  Bed Mobility Overal bed mobility: Needs Assistance Bed Mobility: Supine to Sit;Sit to Supine     Supine to sit: Min guard Sit to supine: Min assist   General bed mobility comments: min guard for supine>sit for safety, increased time with cues for step-by-step sequencing. Min assist for return to supine for LE lifting into bed, positioning, boost up in bed.    Transfers Overall transfer level: Needs assistance Equipment used: 1 person hand held assist Transfers: Sit to/from ;Lateral/Scoot Transfers Sit to Stand: Min assist Stand pivot transfers: Min assist      Lateral/Scoot Transfers: Min assist General  transfer comment: min assist for power up, stand, and pivot out of w/c. STS x4 throughout session. Scoot pivot from bed>recliner initially with focus on head-hips relationship to offweight hips, sequential scooting, scoot towards pt's R to drop arm recliner.  Ambulation/Gait Ambulation/Gait assistance: Min assist;+2 safety/equipment Gait Distance (Feet): 80 Feet (+50) Assistive device: 1 person hand held assist (R handrail in hallway, PT supporting LUE) Gait Pattern/deviations: Step-to pattern;Decreased step length - left;Decreased weight shift to left;Decreased dorsiflexion - left;Narrow base of support Gait velocity: decr   General Gait Details: Min assist to steady at waist via gait belt, occasional blocking at L knee to prevent buckling. Verbal cuing for widening BOS using lines on flooring as external cue, increasing foot clearance LLE via hip/knee flexion and DF. Pt ambulatory 80 ft, seated rest x2 minutes, followed by 50 more ft.   Stairs             [employer] mobility: Yes Wheelchair propulsion: Right upper extremity;Both lower extermities Wheelchair parts: Needs assistance Distance: 100 Wheelchair Assistance Details (indicate cue type and reason): Min assist for steering, facilitating LLE in propelling w/c with cuing needed for forward progression LLE nearly every time otherwise pt inattentive and forgets to progress LLE.  Modified Rankin (Stroke Patients Only) Modified Rankin (Stroke Patients Only) Pre-Morbid Rankin Score: No symptoms Modified Rankin: Moderately severe disability     Balance Overall balance assessment: Needs assistance Sitting-balance support: Feet supported;Single extremity supported Sitting balance-Leahy Scale: Fair     Standing balance support: During functional activity;Single extremity supported Standing balance-Leahy Scale: Poor Standing balance comment: min A for maintaining balance with R UE  support  Cognition Arousal/Alertness: Awake/alert Behavior During Therapy: Flat affect Overall Cognitive Status: Within Functional Limits for tasks assessed Area of Impairment: Following commands;Safety/judgement;Awareness;Attention;Problem solving;Memory                   Current Attention Level: Sustained Memory: Decreased short-term memory (repeated cues for body awareness) Following Commands: Follows one step commands with increased time;Follows multi-step commands with increased time Safety/Judgement: Decreased awareness of safety;Decreased awareness of deficits Awareness: Emergent Problem Solving: Slow processing;Difficulty sequencing;Requires verbal cues;Requires tactile cues General Comments: pt requires max multimodal cuing for attention to LUE/LE, especially dynamically in w/c and during gait training. Pt states "I know" each time PT cues.      Exercises Other Exercises Other Exercises: L lateral step over 2 inch object at sink, bilat UEs propped in WB, x5 to L/R with cues for increasing hip and knee flexion, DF    General Comments        Pertinent Vitals/Pain Pain Assessment: No/denies pain Pain Intervention(s): Monitored during session    Home Living                      Prior Function            PT Goals (current goals can now be found in the care plan section) Acute Rehab PT Goals Patient Stated Goal: "walk again, the doctors think I can" PT Goal Formulation: With patient/family Time For Goal Achievement: 11/05/20 Potential to Achieve Goals: Good Progress towards PT goals: Progressing toward goals    Frequency    Min 5X/week      PT Plan Current plan remains appropriate    Co-evaluation              AM-PAC PT "6 Clicks" Mobility   Outcome Measure  Help needed turning from your back to your side while in a flat bed without using bedrails?: A Little Help needed moving from lying on your  back to sitting on the side of a flat bed without using bedrails?: A Little Help needed moving to and from a bed to a chair (including a wheelchair)?: A Little Help needed standing up from a chair using your arms (e.g., wheelchair or bedside chair)?: A Little Help needed to walk in hospital room?: A Lot Help needed climbing 3-5 steps with a railing? : Total 6 Click Score: 15    End of Session Equipment Utilized During Treatment: Gait belt Activity Tolerance: Patient tolerated treatment well Patient left: with call bell/phone within reach;with bed alarm set;in bed Nurse Communication: Mobility status PT Visit Diagnosis: Unsteadiness on feet (R26.81);Other abnormalities of gait and mobility (R26.89);Difficulty in walking, not elsewhere classified (R26.2);Hemiplegia and hemiparesis Hemiplegia - Right/Left: Left Hemiplegia - dominant/non-dominant: Non-dominant Hemiplegia - caused by: Cerebral infarction     Time: 3500-9381 PT Time Calculation (min) (ACUTE ONLY): 47 min  Charges:  $Gait Training: 23-37 mins $Therapeutic Activity: 8-22 mins                    Marye Round, PT DPT Acute Rehabilitation Services Pager [phone]  Office [phone]   Tyrone Apple E Christain [place] 10/22/2020, 12:27 PM

## 2020-10-23 NOTE — Progress Notes (Signed)
STROKE TEAM PROGRESS NOTE   SUBJECTIVE (INTERVAL HISTORY) Patient is sitting in the bedside.  This therapist in the room.  Continues to work with them but is not safe enough to go home yet.  Unfortunately does not have a living situation where he can go at this time and is difficult to place in a nursing home.  Vital signs are stable.  Neurological exam unchanged OBJECTIVE Temp:  [97.6 F (36.4 C)-98.9 F (37.2 C)] 98.5 F (36.9 C) (05/10 1226) Pulse Rate:  [71-97] 71 (05/10 1226) Cardiac Rhythm: Normal sinus rhythm;Heart block (05/10 0703) Resp:  [10-19] 15 (05/10 0756) BP: (131-165)/(76-91) 136/84 (05/10 1226) SpO2:  [99 %-100 %] 99 % (05/10 1226)  No results for input(s): GLUCAP in the last 168 hours. Recent Labs  Lab 10/17/20 0421 10/19/20 0447 10/20/20 0152 10/22/20 0752  NA 140 136 137 139  K 3.8 3.4* 3.6 3.2*  CL 105 101 105 103  CO2 27 27 27 28   GLUCOSE 96 96 105* 91  BUN 18 12 15 12   CREATININE 0.71 0.66 0.70 0.71  CALCIUM 8.1* 8.4* 8.1* 8.3*   No results for input(s): AST, ALT, ALKPHOS, BILITOT, PROT, ALBUMIN in the last 168 hours. Recent Labs  Lab 10/17/20 0421 10/19/20 0447 10/22/20 0752  WBC 6.7 5.8 4.7  HGB 8.7* 8.6* 8.5*  HCT 25.7* 25.6* 25.2*  MCV 85.7 86.8 86.3  PLT 462* 471* 406*        Component Value Date/Time   CHOL 297 (H) 10/02/2020 0607   TRIG 62 10/02/2020 0607   HDL 73 10/02/2020 0607   CHOLHDL 4.1 10/02/2020 0607   VLDL 12 10/02/2020 0607   LDLCALC 212 (H) 10/02/2020 0607   Lab Results  Component Value Date   HGBA1C 5.3 10/02/2020      Component Value Date/Time   LABOPIA NONE DETECTED 10/05/2020 1321   COCAINSCRNUR NONE DETECTED 10/05/2020 1321   LABBENZ NONE DETECTED 10/05/2020 1321   AMPHETMU NONE DETECTED 10/05/2020 1321   THCU NONE DETECTED 10/05/2020 1321   LABBARB NONE DETECTED 10/05/2020 1321    No results for input(s): ETH in the last 168 hours.  MR Brain 1. Acute infarcts in the anterior and posterior right  frontal lobe. Associated edema with sulcal effacement. Mild curvilinear susceptibility artifact the regions of infarct likely represent petechial hemorrhage. No mass occupying acute hemorrhage. 2. Focal susceptibility artifact within a right M2 MCA branch in the region of the more posterior frontal lobe infarct, compatible with thrombus and known occlusion better characterized on recent CTA. 3. 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 4. Large right mastoid effusion  CTA 1. No pulmonary vascular malformation or acute intrathoracic abnormality. 2. Mild cardiomegaly  CT head 1. Acute infarcts in the anterior and posterior right frontal lobe. Associated edema with sulcal effacement. Mild curvilinear susceptibility artifact the regions of infarct likely represent petechial hemorrhage. No mass occupying acute hemorrhage. 2. Focal susceptibility artifact within a right M2 MCA branch in the region of the more posterior frontal lobe infarct, compatible with thrombus and known occlusion better characterized on recent CTA. 3. 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 4. Large right mastoid effusion  PHYSICAL EXAM  Temp:  [97.6 F (36.4 C)-98.9 F (37.2 C)] 98.5 F (36.9 C) (05/10 1226) Pulse Rate:  [71-97] 71 (05/10 1226) Resp:  [10-19] 15 (05/10 0756) BP: (131-165)/(76-91) 136/84 (05/10 1226) SpO2:  [99 %-100 %] 99 % (05/10 1226)  General -frail middle-age African-American male not in distress Resp:  No extra work of breathing Cardiovascular - Regular rhythm and rate.  Neuro - awake alert and oriented  to age, place, time and people. No aphasia, mild dysarthria but fluent language, following all simple commands. Able to name and repeat.  Mild dysarthria.  no gaze palsy now, but still more right gaze preference, visual field full no hemianopia, PERRL.  Left facial droop. Tongue protrusion to the left.  Right upper and lower extremity at least 4/5, left  upper extremity deltoid and bicep 3-/5  , tricep and finger movement 0/5, left lower extremity 3/5 proximal and distal PF/DF 1/5.  Sensation decreased on the left. Right finger-to-nose intact.  Gait not tested.    ASSESSMENT/PLAN [name] is a 45 y.o. male with PMH of heart murmur not on medications presented to ER for acute onset left arm and leg weakness, left visual and sensory neglect, right gaze preference, left facial droop nausea/vomiting. tPA given.  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  Discussed with patient regarding risk and benefit of thrombectomy, patient declined thrombectomy.     Stroke: Right MCA infarct, embolic pattern, source unclear but likely cardioembolic (see below)  CT head early ischemic changes right MCA but no hypodensity  CTA head neck was done showed right M2/M3 occlusion.   MRI right moderate MCA cortical infarcts   2D Echo EF 40 to 45%  LE venous Doppler no DVT  TEE EF 35-40%, no clot but LA appendage with SEC and reduced LAA emptying velocity. positive for PFO  TCD bubble study minmal (Spencer degree 2 ) PFO  LE venous Doppler negative for DVT  LDL 212  HgbA1c 5.3  UDS negative  Lovenox for VTE prophylaxis  No antithrombotic prior to admission, now on aspirin 81mg  daily and clopidogrel 75 mg daily DAPT for 3 months and then aspirin alone given LVO intracranially.  Patient counseled to be compliant with his antithrombotic medications  Ongoing aggressive stroke risk factor management  Therapy recommendations: SNF   Disposition: SNF  Cardiomyopathy  EF 40 to 45%  TEE EF 35-40% with LAA SEC and reduced emptying velocity  Cardiology Dr. on board  CTA chest no pulmonary AVM  outpt follow up with Dr. Jacinto Halim  ?? PFO  TEE positive for bilateral shunting suggestive of PFO  TCD bubble study minmal (Spencer degree 2 ) PFO  CTA chest no pulmonary AVM  Follow up with Dr. Jacinto Halim as  outpt  Hypertensive urgency . Needed labetalol and Cleviprex IV before IV tPA for BP control . Stable on the high end . Off Cleviprex . On amlodipine 10   Long term BP goal normotensive  Hyperlipidemia  Home meds: None  LDL 212, goal < 70  Now on Lipitor 80  Continue statin at discharge  Dysphagia  Mild dysarthria  On dysphagia 2 and thin liquid  intermittent cough improving  Depressed mood  Dealing with new stroke deficits and reporting depressed mood  Less participation reported by therapy staff  Celexa initiated   Other Stroke Risk Factors  Hx of heart murmur  Klebsiella UTI  Leukocytosis now 5.8   Other Active Problems   Hospital day # 22  Continue ongoing therapies.  Patient is medically stable for transfer to skilled nursing facility when bed available.  Greater than 50% time during this 15-minute visit was spent in counseling and coordination of care and discussion with care team.  Jacinto Halim MD       To contact  Stroke Continuity provider, please refer to [email or web address] After hours, contact General Neurology

## 2020-10-23 NOTE — Progress Notes (Signed)
Occupational Therapy Treatment Patient Details Name: Harry [name] MRN: [id number] DOB: [birth year] Today's Date: 10/23/2020    History of present illness 45 yo male presenting to ED with left sided weakness. CT showing early ischemic changes around right MCA but no hypodensity. tPA given on 4/18 @1823 . 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 Awaiting MRI. PMH including heart murmur not on medications.   OT comments  Pt making steady progress towards OT goals this session. Session focus on functional sit<>stands, functional ambulation with L platform RW, and standing tolerance, sesssion limited by incontinent BM during session with needing return to room with complete posterior pericare with total A in standing. Pt completed ~ 20 ft of functional ambulation in hallway with L platform RW and MOD A +2 with chair follow. Pt with better control of LLE during ambulation but still needs step by step cues to sequence RW mgmt and functional steps. DC plan currently remains appropriate, will continue to follow acutely per POC     Follow Up Recommendations  SNF    Equipment Recommendations  3 in 1 bedside commode;Wheelchair (measurements OT);Wheelchair cushion (measurements OT)    Recommendations for Other Services      Precautions / Restrictions Precautions Precautions: Fall;Other (comment) Precaution Comments: LUE hemiparesis, subluxing LUE ( K tape to LUE) Required Braces or Orthoses: Other Brace Other Brace: L cock up wrist splint Restrictions Weight Bearing Restrictions: No       Mobility Bed Mobility Overal bed mobility: Needs Assistance Bed Mobility: Supine to Sit;Sit to Supine     Supine to sit: Min guard;HOB elevated Sit to supine: Min guard;HOB elevated   General bed mobility comments: min guard for safety    Transfers Overall transfer level: Needs assistance Equipment used: Left platform walker;1 person hand held assist Transfers: Sit to/from  Stand;Lateral/Scoot Transfers Sit to Stand: Min assist;Mod assist        Lateral/Scoot Transfers: Min assist General transfer comment: pt completed multiple sit<>stands at sink and with L platform RW, cues needed each trial for hand placement with pt needing up to MOD A +1 as pt fatigued. pt able to laterally scoot to w/c from EOB with MIN A +1 to R and L side this session    Balance Overall balance assessment: Needs assistance Sitting-balance support: Feet supported;Single extremity supported Sitting balance-Leahy Scale: Fair Sitting balance - Comments: sitting EOB   Standing balance support: Bilateral upper extremity supported;During functional activity Standing balance-Leahy Scale: Poor Standing balance comment: min A for maintaining balance with R UE support                           ADL either performed or assessed with clinical judgement   ADL Overall ADL's : Needs assistance/impaired             Lower Body Bathing: Total assistance;Sit to/from stand Lower Body Bathing Details (indicate cue type and reason): pt completed sit<>stand from w/c at sink with MOD A +1 with tech completing posterior pericare in standing with pt needing total A for clealiness Upper Body Dressing : Minimal assistance;Sitting Upper Body Dressing Details (indicate cue type and reason): pt able to place LUE in sleeve as gown using RUE as assist     Toilet Transfer: Moderate assistance;+2 for physical assistance;RW;Ambulation Toilet Transfer Details (indicate cue type and reason): simulated via functional mobility with pt able to ambualte ~ 20 ft with L platform RW and MOD A +2  to assist with WB thru LLE and and managing RW, pt can laterally scoot to w/c from EOB with MIN A +1 as simulated toilet transfer to drop arm 3n1 Toileting- Clothing Manipulation and Hygiene: Total assistance;Sit to/from stand Toileting - Clothing Manipulation Details (indicate cue type and reason): psoterior pericare  in standing     Functional mobility during ADLs: Moderate assistance;+2 for physical assistance;Rolling walker (L platform RW) General ADL Comments: session focus on functional sit<>stands, functional ambulation with L platform RW, and standing tolerance, sesssion limited by incontinent BM during session     Vision       Perception     Praxis      Cognition Arousal/Alertness: Awake/alert Behavior During Therapy: Flat affect Overall Cognitive Status: Impaired/Different from baseline Area of Impairment: Following commands;Safety/judgement;Awareness;Attention;Problem solving;Memory                   Current Attention Level: Selective Memory: Decreased short-term memory (repeated cues for body awareness) Following Commands: Follows one step commands with increased time;Follows multi-step commands with increased time Safety/Judgement: Decreased awareness of safety;Decreased awareness of deficits Awareness: Intellectual Problem Solving: Slow processing;Requires verbal cues General Comments: pt requires max multimodal cuing for attention to LUE/LE        Exercises     Shoulder Instructions       General Comments      Pertinent Vitals/ Pain       Pain Assessment: No/denies pain  Home Living                                          Prior Functioning/Environment              Frequency  Min 3X/week        Progress Toward Goals  OT Goals(current goals can now be found in the care plan section)  Progress towards OT goals: Progressing toward goals  Acute Rehab OT Goals Patient Stated Goal: none stated this session OT Goal Formulation: With patient Time For Goal Achievement: 10/30/20 Potential to Achieve Goals: Good  Plan Discharge plan remains appropriate;Frequency remains appropriate    Co-evaluation                 AM-PAC OT "6 Clicks" Daily Activity     Outcome Measure   Help from another person eating meals?: A  Little Help from another person taking care of personal grooming?: A Little Help from another person toileting, which includes using toliet, bedpan, or urinal?: A Lot Help from another person bathing (including washing, rinsing, drying)?: A Lot Help from another person to put on and taking off regular upper body clothing?: A Little Help from another person to put on and taking off regular lower body clothing?: A Lot 6 Click Score: 15    End of Session Equipment Utilized During Treatment: Gait belt;Rolling walker;Other (comment) (L platform RW; w/c' L wrist splint)  OT Visit Diagnosis: Unsteadiness on feet (R26.81);Other abnormalities of gait and mobility (R26.89);Muscle weakness (generalized) (M62.81);Hemiplegia and hemiparesis;Other symptoms and signs involving cognitive function Hemiplegia - Right/Left: Left Hemiplegia - dominant/non-dominant: Non-Dominant Hemiplegia - caused by: Cerebral infarction   Activity Tolerance Patient tolerated treatment well   Patient Left in bed;with call bell/phone within reach;with bed alarm set   Nurse Communication Mobility status        Time: 6144-3154 OT Time Calculation (min): 46 min  Charges: OT General Charges $  OT Visit: 1 Visit OT Treatments $Self Care/Home Management : 23-37 mins $Therapeutic Activity: 8-22 mins  Lenor Derrick., COTA/L Acute Rehabilitation Services [phone]    Barron Schmid 10/23/2020, 11:47 AM

## 2020-10-23 NOTE — Plan of Care (Signed)
  Problem: Education: Goal: Knowledge of General Education information will improve Description: Including pain rating scale, medication(s)/side effects and non-pharmacologic comfort measures Outcome: Progressing   Problem: Clinical Measurements: Goal: Ability to maintain clinical measurements within normal limits will improve Outcome: Progressing Goal: Will remain free from infection Outcome: Progressing Goal: Diagnostic test results will improve Outcome: Progressing Goal: Respiratory complications will improve Outcome: Progressing Goal: Cardiovascular complication will be avoided Outcome: Progressing   Problem: Safety: Goal: Ability to remain free from injury will improve Outcome: Progressing   Problem: Ischemic Stroke/TIA Tissue Perfusion: Goal: Complications of ischemic stroke/TIA will be minimized Outcome: Progressing

## 2020-10-23 NOTE — Progress Notes (Signed)
Physical Therapy Treatment Patient Details Name: Harry [name] MRN: [id number] DOB: 1975-07-21 Today's Date: 10/23/2020    History of Present Illness 45 yo male presenting to ED with left sided weakness. CT showing early ischemic changes around right MCA but no hypodensity. tPA given on 4/18 @1823 . 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 Awaiting MRI. PMH including heart murmur not on medications.    PT Comments    Started session with discussion of level of assistance that brother requires before he will take pt back to his home. Pt with frequent incontinence of stool so introduced concept of bowel and bladder schedule. Had pt transfer to [facility] immediately on getting up to start process, pt requires min physical assist but continues to require maximal multimodal cuing for safety and sequencing with all mobility. Pt able to transfer from bed to [facility] and BSC, to wc. Plan to use outside space for therapy today however pt not comfortable with walking on concrete. Will work to get him shoes. Returned inside and worked on walking with L platform walker and propulsion of wc with min physical assist for both. PT/OT will combine session tomorrow to work on progression of good quality ambulation with platform walker.     Follow Up Recommendations  SNF     Equipment Recommendations  3in1 (PT);Wheelchair cushion (measurements PT);Wheelchair (measurements PT)       Precautions / Restrictions Precautions Precautions: Fall;Other (comment) Precaution Comments: LUE hemiparesis, subluxing LUE Required Braces or Orthoses: Other Brace (L cock up brace) Other Brace: L cock up wrist splint    Mobility  Bed Mobility Overal bed mobility: Needs Assistance Bed Mobility: Supine to Sit     Supine to sit: Min guard Sit to supine: Min guard   General bed mobility comments: min guard for safety with getting up and down from the bed, requirs constant cuing for attention to L side  especially L UE    Transfers Overall transfer level: Needs assistance Equipment used: Rolling walker (2 wheeled) Transfers: Sit to/from Stand Sit to Stand: Min assist Stand pivot transfers: Min assist;Mod assist Squat pivot transfers: Min assist     General transfer comment: min physical assist but mod-max cuing for sequencing pivot from bed to [facility] and BSC to w/c, cues for w/c safety with pivot back to bed, PT provided less cues for sequencing as pt becoming reliant on cues for what to due next, with increased time and effort pt able to sequence transfer back to bed with min A  Ambulation/Gait Ambulation/Gait assistance: Min assist;+2 safety/equipment Gait Distance (Feet): 30 Feet Assistive device: Left platform walker Gait Pattern/deviations: Step-to pattern;Decreased step length - right;Decreased step length - left;Decreased weight shift to left Gait velocity: decr Gait velocity interpretation: <1.31 ft/sec, indicative of household ambulator General Gait Details: min A at waist and maximal multimodal cuing for sequencing and particularly increasing BoS for improved [name] mobility: Yes Wheelchair propulsion: Right upper extremity;Right lower extremity Wheelchair parts: Needs assistance Distance: 300 Wheelchair Assistance Details (indicate cue type and reason): very light min A for steering, pt tired from excursion outside and walking with platform walker so allowed pt to leave L LE in foot rest and use only R side for propulsion  Modified Rankin (Stroke Patients Only) Modified Rankin (Stroke Patients Only) Pre-Morbid Rankin Score: No symptoms Modified Rankin: Moderately severe disability     Balance Overall balance assessment: Needs assistance Sitting-balance support: Feet supported;Single extremity supported  Sitting balance-Leahy Scale: Fair Sitting balance - Comments: close guard for safety statically, requires  light assist dynamically   Standing balance support: Bilateral upper extremity supported;During functional activity;Single extremity supported Standing balance-Leahy Scale: Poor Standing balance comment: min A for maintaining balance with R UE support                            Cognition Arousal/Alertness: Awake/alert Behavior During Therapy: Flat affect Overall Cognitive Status: Impaired/Different from baseline Area of Impairment: Following commands;Safety/judgement;Awareness;Attention;Problem solving;Memory                   Current Attention Level: Selective Memory: Decreased short-term memory (repeated cues for body awareness) Following Commands: Follows one step commands with increased time;Follows multi-step commands with increased time Safety/Judgement: Decreased awareness of safety;Decreased awareness of deficits Awareness: Intellectual Problem Solving: Slow processing;Requires verbal cues General Comments: continues to require max mutlimodal cuing for inattention to L side with mobility         General Comments General comments (skin integrity, edema, etc.): Took pt to covered outside area for therapy, however pt not comfortable with walking on the concrete will work on getting shoes      Pertinent Vitals/Pain Pain Assessment: No/denies pain Faces Pain Scale: No hurt           PT Goals (current goals can now be found in the care plan section) Acute Rehab PT Goals Patient Stated Goal: none stated today PT Goal Formulation: With patient/family Time For Goal Achievement: 10/16/20 Potential to Achieve Goals: Good Progress towards PT goals: Progressing toward goals    Frequency    Min 5X/week      PT Plan Current plan remains appropriate       AM-PAC PT "6 Clicks" Mobility   Outcome Measure  Help needed turning from your back to your side while in a flat bed without using bedrails?: A Lot Help needed moving from lying on your back to  sitting on the side of a flat bed without using bedrails?: A Lot Help needed moving to and from a bed to a chair (including a wheelchair)?: A Lot Help needed standing up from a chair using your arms (e.g., wheelchair or bedside chair)?: A Lot Help needed to walk in hospital room?: A Lot Help needed climbing 3-5 steps with a railing? : Total 6 Click Score: 11    End of Session Equipment Utilized During Treatment: Gait belt Activity Tolerance: Patient tolerated treatment well Patient left: with call bell/phone within reach;with bed alarm set;in bed Nurse Communication: Mobility status PT Visit Diagnosis: Unsteadiness on feet (R26.81);Other abnormalities of gait and mobility (R26.89);Difficulty in walking, not elsewhere classified (R26.2);Hemiplegia and hemiparesis Hemiplegia - Right/Left: Left Hemiplegia - dominant/non-dominant: Non-dominant Hemiplegia - caused by: Cerebral infarction     Time: 6222-9798 PT Time Calculation (min) (ACUTE ONLY): 57 min  Charges:  $Gait Training: 8-22 mins $Therapeutic Activity: 23-37 mins $Wheel Chair Management: 8-22 mins                     Geniya Fulgham B. Beverely Risen PT, DPT Acute Rehabilitation Services Pager [phone] Office [phone]    Elon Alas Fleet 10/23/2020, 4:01 PM

## 2020-10-23 NOTE — Progress Notes (Signed)
  Speech Language Pathology Treatment: Cognitive-Linquistic  Patient Details Name: Harry [name] MRN: [id number] DOB: [birth year] Today's Date: 10/23/2020 Time: 2878-6767 SLP Time Calculation (min) (ACUTE ONLY): 22 min  Assessment / Plan / Recommendation Clinical Impression  Pt seen for speech/cognitive treatment focusing on improving sustained attention, STM/working memory and awareness tasks specifically sequencing functional tasks such as cooking, self-care with min-mod verbal cues provided, leading questions for missed steps and general directives given without cues (ie: making a grilled cheese, he forgot all items required, stated "I just get the cheese and bread and make it") with 65% accuracy obtained overall.  He is aware of some deficits and needs, such as L hand splint, but requires min cueing for precautions.  Decreased problem solving/insight with cognitive deficits impacting his safety for d/c home or a facility evidenced by stating "Nothing" when asked what needs he would have if discharged home. He later stated he had difficulty "walking, but physical therapy is helping me with that", so awareness varies.  Speech was judged to be 85% intelligible within conversation with min cues provided for reduced rate, overarticulation and increased breath support prn.  He stated he "doesn't have as many problems eating with food getting stuck in the side of my mouth."  Pt had already had lunch tray and refused POs during session.  ST will continue to f/u for speech/cog deficits and tolerance of current diet/safety with swallowing while in acute setting.     HPI HPI: Harry [name] is a 45 y.o. African American male with PMH of heart murmur not on medications presented to ER for acute onset left arm and leg weakness, left neglect, right gaze preference, nausea/vomiting.  Patient fully awake alert orientated, per patient he stated that he was reading from his cell phone around 5:25 PM, he had acute  onset not feeling well, nausea and vomiting.  However, he denies any arm or leg weakness numbness.  His brother is in the same house but given room with him, brother saw him last at 26 AM at baseline, but then saw him after he had nausea vomiting and noticed that he was not able to move on the left side with left facial droop.      SLP Plan  Continue with current plan of care       Recommendations  Diet recommendations: Dysphagia 3 (mechanical soft);Thin liquid Liquids provided via: Cup;Straw Medication Administration: Whole meds with liquid Supervision: Patient able to self feed;Intermittent supervision to cue for compensatory strategies Compensations: Slow rate;Small sips/bites;Minimize environmental distractions;Lingual sweep for clearance of pocketing                Oral Care Recommendations: Oral care BID Follow up Recommendations: 24 hour supervision/assistance;Other (comment) (TBD) SLP Visit Diagnosis: Dysphagia, oropharyngeal phase (R13.12);Dysarthria and anarthria (R47.1);Cognitive communication deficit (M09.470) Plan: Continue with current plan of care                       Tressie Stalker, M.S., CCC-SLP 10/23/2020, 2:41 PM

## 2020-10-24 LAB — CBC
HCT: 26.6 % — ABNORMAL LOW (ref 39.0–52.0)
Hemoglobin: 8.8 g/dL — ABNORMAL LOW (ref 13.0–17.0)
MCH: 28.5 pg (ref 26.0–34.0)
MCHC: 33.1 g/dL (ref 30.0–36.0)
MCV: 86.1 fL (ref 80.0–100.0)
Platelets: 386 10*3/uL (ref 150–400)
RBC: 3.09 MIL/uL — ABNORMAL LOW (ref 4.22–5.81)
RDW: 13.2 % (ref 11.5–15.5)
WBC: 4.7 10*3/uL (ref 4.0–10.5)
nRBC: 0 % (ref 0.0–0.2)

## 2020-10-24 LAB — BASIC METABOLIC PANEL
Anion gap: 8 (ref 5–15)
BUN: 12 mg/dL (ref 6–20)
CO2: 29 mmol/L (ref 22–32)
Calcium: 8.3 mg/dL — ABNORMAL LOW (ref 8.9–10.3)
Chloride: 102 mmol/L (ref 98–111)
Creatinine, Ser: 0.64 mg/dL (ref 0.61–1.24)
GFR, Estimated: >60 mL/min (ref >60)
Glucose, Bld: 92 mg/dL (ref 70–99)
Potassium: 3 mmol/L — ABNORMAL LOW (ref 3.5–5.1)
Sodium: 139 mmol/L (ref 135–145)

## 2020-10-24 NOTE — Progress Notes (Signed)
STROKE TEAM PROGRESS NOTE   SUBJECTIVE (INTERVAL HISTORY) Patient is sitting in the bedside.  Therapist recommend rehabilitation in SNF setting.  Continues to work with them but is not safe enough to go home yet.  Unfortunately does not have a living situation where he can go at this time and is difficult to place in a nursing home.  Vital signs are stable.  Neurological exam unchanged.  No changes OBJECTIVE Temp:  [97.5 F (36.4 C)-98.1 F (36.7 C)] 98 F (36.7 C) (05/11 1233) Pulse Rate:  [71-83] 73 (05/11 1233) Cardiac Rhythm: Heart block (05/11 0700) Resp:  [17-19] 19 (05/11 0349) BP: (122-170)/(76-108) 126/80 (05/11 1233) SpO2:  [98 %-100 %] 98 % (05/11 1233)  No results for input(s): GLUCAP in the last 168 hours. Recent Labs  Lab 10/19/20 0447 10/20/20 0152 10/22/20 0752 10/24/20 0453  NA 136 137 139 139  K 3.4* 3.6 3.2* 3.0*  CL 101 105 103 102  CO2 27 27 28 29   GLUCOSE 96 105* 91 92  BUN 12 15 12 12   CREATININE 0.66 0.70 0.71 0.64  CALCIUM 8.4* 8.1* 8.3* 8.3*   No results for input(s): AST, ALT, ALKPHOS, BILITOT, PROT, ALBUMIN in the last 168 hours. Recent Labs  Lab 10/19/20 0447 10/22/20 0752 10/24/20 0453  WBC 5.8 4.7 4.7  HGB 8.6* 8.5* 8.8*  HCT 25.6* 25.2* 26.6*  MCV 86.8 86.3 86.1  PLT 471* 406* 386        Component Value Date/Time   CHOL 297 (H) 10/02/2020 0607   TRIG 62 10/02/2020 0607   HDL 73 10/02/2020 0607   CHOLHDL 4.1 10/02/2020 0607   VLDL 12 10/02/2020 0607   LDLCALC 212 (H) 10/02/2020 0607   Lab Results  Component Value Date   HGBA1C 5.3 10/02/2020      Component Value Date/Time   LABOPIA NONE DETECTED 10/05/2020 1321   COCAINSCRNUR NONE DETECTED 10/05/2020 1321   LABBENZ NONE DETECTED 10/05/2020 1321   AMPHETMU NONE DETECTED 10/05/2020 1321   THCU NONE DETECTED 10/05/2020 1321   LABBARB NONE DETECTED 10/05/2020 1321    No results for input(s): ETH in the last 168 hours.  MR Brain 1. Acute infarcts in the anterior and  posterior right frontal lobe. Associated edema with sulcal effacement. Mild curvilinear susceptibility artifact the regions of infarct likely represent petechial hemorrhage. No mass occupying acute hemorrhage. 2. Focal susceptibility artifact within a right M2 MCA branch in the region of the more posterior frontal lobe infarct, compatible with thrombus and known occlusion better characterized on recent CTA. 3. 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 4. Large right mastoid effusion  CTA 1. No pulmonary vascular malformation or acute intrathoracic abnormality. 2. Mild cardiomegaly  CT head 1. Acute infarcts in the anterior and posterior right frontal lobe. Associated edema with sulcal effacement. Mild curvilinear susceptibility artifact the regions of infarct likely represent petechial hemorrhage. No mass occupying acute hemorrhage. 2. Focal susceptibility artifact within a right M2 MCA branch in the region of the more posterior frontal lobe infarct, compatible with thrombus and known occlusion better characterized on recent CTA. 3. 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 4. Large right mastoid effusion  PHYSICAL EXAM  Temp:  [97.5 F (36.4 C)-98.1 F (36.7 C)] 98 F (36.7 C) (05/11 1233) Pulse Rate:  [71-83] 73 (05/11 1233) Resp:  [17-19] 19 (05/11 0349) BP: (122-170)/(76-108) 126/80 (05/11 1233) SpO2:  [98 %-100 %] 98 % (05/11 1233)  General -frail middle-age African-American male not in  distress Resp: No extra work of breathing Cardiovascular - Regular rhythm and rate.  Neuro - awake alert and oriented  to age, place, time and people. No aphasia, mild dysarthria but fluent language, following all simple commands. Able to name and repeat.  Mild dysarthria.  no gaze palsy now, but still more right gaze preference, visual field full no hemianopia, PERRL.  Left facial droop. Tongue protrusion to the left.  Right upper and lower extremity at  least 4/5, left upper extremity deltoid and bicep 3-/5  , tricep and finger movement 0/5, left lower extremity 3/5 proximal and distal PF/DF 1/5.  Sensation decreased on the left. Right finger-to-nose intact.  Gait not tested.    ASSESSMENT/PLAN Mr. KEVANTE LUNT is a 45 y.o. male with PMH of heart murmur not on medications presented to ER for acute onset left arm and leg weakness, left visual and sensory neglect, right gaze preference, left facial droop nausea/vomiting. tPA given.  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  Discussed with patient regarding risk and benefit of thrombectomy, patient declined thrombectomy.     Stroke: Right MCA infarct, embolic pattern, source unclear but likely cardioembolic (see below)  CT head early ischemic changes right MCA but no hypodensity  CTA head neck was done showed right M2/M3 occlusion.   MRI right moderate MCA cortical infarcts   2D Echo EF 40 to 45%  LE venous Doppler no DVT  TEE EF 35-40%, no clot but LA appendage with SEC and reduced LAA emptying velocity. positive for PFO  TCD bubble study minmal (Spencer degree 2 ) PFO  LE venous Doppler negative for DVT  LDL 212  HgbA1c 5.3  UDS negative  Lovenox for VTE prophylaxis  No antithrombotic prior to admission, now on aspirin 81mg  daily and clopidogrel 75 mg daily DAPT for 3 months and then aspirin alone given LVO intracranially.  Patient counseled to be compliant with his antithrombotic medications  Ongoing aggressive stroke risk factor management  Therapy recommendations: SNF   Disposition: SNF  Cardiomyopathy  EF 40 to 45%  TEE EF 35-40% with LAA SEC and reduced emptying velocity  Cardiology Dr. on board  CTA chest no pulmonary AVM  outpt follow up with Dr. Jacinto Halim  ?? PFO  TEE positive for bilateral shunting suggestive of PFO  TCD bubble study minmal (Spencer degree 2 ) PFO  CTA chest no pulmonary AVM  Follow up with Dr. Jacinto Halim  as outpt  Hypertensive urgency . Needed labetalol and Cleviprex IV before IV tPA for BP control . Stable on the high end . Off Cleviprex . On amlodipine 10   Long term BP goal normotensive  Hyperlipidemia  Home meds: None  LDL 212, goal < 70  Now on Lipitor 80  Continue statin at discharge  Dysphagia  Mild dysarthria  On dysphagia 2 and thin liquid  intermittent cough improving  Depressed mood  Dealing with new stroke deficits and reporting depressed mood  Less participation reported by therapy staff  Celexa initiated   Other Stroke Risk Factors  Hx of heart murmur  Klebsiella UTI  Leukocytosis now 5.8   Other Active Problems   Hospital day # 23  Continue ongoing therapies.  Patient is medically stable for transfer to skilled nursing facility when bed available.   Jacinto Halim MD       To contact Stroke Continuity provider, please refer to Delia Heady. After hours, contact General Neurology

## 2020-10-24 NOTE — Progress Notes (Signed)
Physical Therapy Treatment Patient Details Name: Harry [name] MRN: [id number] DOB: [birth year] Today's Date: 10/24/2020    History of Present Illness 45 yo male presenting to ED with left sided weakness. CT showing early ischemic changes around right MCA but no hypodensity. tPA given on 4/18 @1823 . 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 Awaiting MRI. PMH including heart murmur not on medications.    PT Comments    Pt has not liked to work with platform walker because he wants to walk with a cane. Focus of session to illustrate how pt is not ready for a cane right now and is able to better work on progressing ambulation with using platform walker right now. To further promote success pt provided shoes and utilized Ace wrap to provide increased dorsiflexion. Pt continues to be internally and externally distract which becomes worse with fatigue. Also working with pt on problem solving and sequencing on his own and pt has become dependent on cuing for ever step. When pt asked to get up on his own pt with maximal difficulty producing a motor plan. However, when in standing therapist were able to remove assist without attention to removal of assist and he was able to static stand instinctively. At end of session, PT/OT/pt called brother, , to discuss level of assist needed to achieve before going home. Shaun sympathetic however reports small living space that is further cluttered by his 9 yo daughters things. 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 Therapy will continue to work on progressing pt independence.     Follow Up Recommendations  SNF     Equipment Recommendations  3in1 (PT);Wheelchair cushion (measurements PT);Wheelchair (measurements PT)       Precautions / Restrictions Precautions Precautions: Fall;Other (comment) Precaution Comments: LUE hemiparesis, subluxing  LUE Required Braces or Orthoses: Other Brace Other Brace: L cock up wrist splint Restrictions Weight Bearing Restrictions: No    Mobility  Bed Mobility Overal bed mobility: Needs Assistance Bed Mobility: Supine to Sit;Sit to Supine     Supine to sit: Supervision;HOB elevated Sit to supine: Min assist   General bed mobility comments: supervision to transition from supine>sitting, better awareness on LUE during bed mobility, min A to return to supine needing assist to elevated BLEs back to bed d/t fatigue    Transfers Overall transfer level: Needs assistance Equipment used: None;Left platform walker Transfers: Sit to/from 2 Transfers Sit to Stand: Min assist;Min guard   Squat pivot transfers: Min assist     General transfer comment: pt completed multiple sit<>stands during session with pt needing MIN A +1 initially but progressing to min guard assist ( noted pt heavily relies on external cues from therapists to sequence steps during mobility tasks), worked on providing less cues to allow for maximum independence with functional mobility. Pt additionally completed more of squat pivot from EOB <>w/c with MIN A +1,continued cues needed for awareness to LUE during transfers  Ambulation/Gait Ambulation/Gait assistance: Min assist;+2 physical assistance;+2 safety/equipment Gait Distance (Feet): 30 Feet (1x10, 1x30) Assistive device: Left platform walker;Straight cane Gait Pattern/deviations: Step-to pattern;Decreased step length - right;Decreased step length - left;Decreased weight shift to left Gait velocity: decr Gait velocity interpretation: <1.31 ft/sec, indicative of household ambulator General Gait Details: initiated ambulation with SPC requireing modAx2 and additional therapist in front to faciltate safe L LE placement for ambulation. Pt requires increased cuing for  sequencing and weightshifting to progress mobility, returned to platform walker and pt able to  progress to minAx1 and therapist in front to assist with L LE placement, increased cuing for sequencing and Recruitment consultant Wheelchair mobility: Yes Wheelchair propulsion: Right upper extremity;Right lower extremity Wheelchair parts: Needs assistance Distance: 300 (x2) Wheelchair Assistance Details (indicate cue type and reason): supervision for safety, cuing for use of R UE on wheel and not pulling on hand rail in hallway to progress mobility  Modified Rankin (Stroke Patients Only) Modified Rankin (Stroke Patients Only) Pre-Morbid Rankin Score: No symptoms Modified Rankin: Moderately severe disability     Balance Overall balance assessment: Needs assistance Sitting-balance support: Feet supported;Single extremity supported Sitting balance-Leahy Scale: Fair     Standing balance support: Bilateral upper extremity supported;Single extremity supported;No upper extremity supported Standing balance-Leahy Scale: Fair Standing balance comment: able to static stand with close minguard this session                            Cognition Arousal/Alertness: Awake/alert Behavior During Therapy: Flat affect Overall Cognitive Status: Impaired/Different from baseline Area of Impairment: Following commands;Safety/judgement;Awareness;Attention;Problem solving;Memory                   Current Attention Level: Selective Memory: Decreased short-term memory (repeated cues for body awareness) Following Commands: Follows one step commands with increased time;Follows multi-step commands with increased time Safety/Judgement: Decreased awareness of safety;Decreased awareness of deficits Awareness: Intellectual Problem Solving: Slow processing;Requires verbal cues General Comments: continues to be limited by internal and external distractions         General Comments General comments (skin integrity, edema, etc.): pt provided with shoes and  utilized ace wrap for increased dorsiflexion, pt continuest to have internal and external distraction      Pertinent Vitals/Pain Pain Assessment: No/denies pain           PT Goals (current goals can now be found in the care plan section) Acute Rehab PT Goals Patient Stated Goal: to get his own shoes and clothes and to see "faith" PT Goal Formulation: With patient/family Time For Goal Achievement: 10/16/20 Potential to Achieve Goals: Good Progress towards PT goals: Progressing toward goals    Frequency    Min 5X/week      PT Plan Current plan remains appropriate    Co-evaluation PT/OT/SLP Co-Evaluation/Treatment: Yes Reason for Co-Treatment: Necessary to address cognition/behavior during functional activity PT goals addressed during session: Mobility/safety with mobility;Proper use of DME OT goals addressed during session: ADL's and self-care;Proper use of Adaptive equipment and DME      AM-PAC PT "6 Clicks" Mobility   Outcome Measure  Help needed turning from your back to your side while in a flat bed without using bedrails?: A Little Help needed moving from lying on your back to sitting on the side of a flat bed without using bedrails?: A Little Help needed moving to and from a bed to a chair (including a wheelchair)?: A Lot Help needed standing up from a chair using your arms (e.g., wheelchair or bedside chair)?: A Lot Help needed to walk in hospital room?: A Lot Help needed climbing 3-5 steps with a railing? 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 Beverely Risen PT, DPT Acute Rehabilitation Services Pager [phone] Office [phone]    [facility] 10/24/2020, 3:33 PM

## 2020-10-24 NOTE — Progress Notes (Signed)
Occupational Therapy Treatment Patient Details Name: Harry [name] MRN: [id number] DOB: 1975/08/30 Today's Date: 10/24/2020    History of present illness 45 yo male presenting to ED with left sided weakness. CT showing early ischemic changes around right MCA but no hypodensity. tPA given on 4/18 @1823 . 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 Awaiting MRI. PMH including heart murmur not on medications.   OT comments  Pt seen in conjunction with PT to collaborate on progressing functional mobility and determining LRAD for ambulation. Trialed use of SPC during ambulation with pt needing increased assist however pt making excellent progress with use of L platfrom RW during ambulation with pt completing functional mobility greater than a household distance with MIN A +2 with chair follow, pt continues to require step by step cues for body mechanics but was able to stand unsupported from w/c with min guard assist. Pt additionally making progress with w/c mobility needing only supervision. Spoke with pts brother today on the phone with brother confirming that pt needed to be independent to come home and if pt needed an AD to ambulate it would need to be a cane d/t small home environment, will work towards goal of using cane for ambulation. Will continue to follow pt acutely.    Follow Up Recommendations  SNF    Equipment Recommendations  3 in 1 bedside commode;Wheelchair (measurements OT);Wheelchair cushion (measurements OT)    Recommendations for Other Services      Precautions / Restrictions Precautions Precautions: Fall;Other (comment) Precaution Comments: LUE hemiparesis, subluxing LUE Required Braces or Orthoses: Other Brace Other Brace: L cock up wrist splint Restrictions Weight Bearing Restrictions: No       Mobility Bed Mobility Overal bed mobility: Needs Assistance Bed Mobility: Supine to Sit;Sit to Supine     Supine to sit: Supervision;HOB  elevated Sit to supine: Min assist   General bed mobility comments: supervision to transition from supine>sitting, better awareness on LUE during bed mobility, min A to return to supine needing assist to elevated BLEs back to bed d/t fatigue    Transfers Overall transfer level: Needs assistance Equipment used: None;Left platform walker Transfers: Sit to/from Transfers Sit to Stand: Min assist;Min guard   Squat pivot transfers: Min assist     General transfer comment: pt completed multiple sit<>stands during session with pt needing MIN A +1 initially but progressing to min guard assist ( noted pt heavily relies on external cues from therapists to sequence steps during mobility tasks), worked on providing less cues to allow for maximum independence with functional mobility. Pt additionally completed more of squat pivot from EOB <>w/c with MIN A +1,continued cues needed for awareness to LUE during transfers    Balance Overall balance assessment: Needs assistance Sitting-balance support: Feet supported;Single extremity supported Sitting balance-Leahy Scale: Fair     Standing balance support: Bilateral upper extremity supported;Single extremity supported;No upper extremity supported Standing balance-Leahy Scale: Fair Standing balance comment: able to static stand with close minguard this session                           ADL either performed or assessed with clinical judgement   ADL Overall ADL's : Needs assistance/impaired     Grooming: Oral care;Standing;Minimal assistance;Wash/dry [employer] Details (indicate cue type and reason): MIN A +2 for standing grooming tasks at sink with pt using mostly hemi techniques for grooming tasks, pt requries MIN A +2 for standing  balance at sink.pt also able to wash L hand with set- up assist and apply lotion to L hand, cues needed for safety as pt applying excessive lotion to L hand         Upper Body Dressing :  Minimal assistance;Sitting Upper Body Dressing Details (indicate cue type and reason): pt able to place LUE in sleeve as gown using RUE as assist Lower Body Dressing: Maximal assistance;Sit to/from stand Lower Body Dressing Details (indicate cue type and reason): to don shoes Toilet Transfer: +2 for physical assistance;RW;Ambulation;Minimal assistance Toilet Transfer Details (indicate cue type and reason): simulated via functional mobility with pt able to ambulate with  L platform RW with MIN A +2 to assist with sequence functional gait patternand managing RW, pt can laterally scoot to w/c from EOB with MIN A +1 as simulated toilet transfer to drop arm 3n1         Functional mobility during ADLs: Minimal assistance;+2 for physical assistance;+2 for safety/equipment (L platform RW) General ADL Comments: session focus progression functional mobility with L platform RW ( trialed cane with pt needing increased assist)     Vision       Perception     Praxis      Cognition Arousal/Alertness: Awake/alert Behavior During Therapy: Flat affect Overall Cognitive Status: Impaired/Different from baseline Area of Impairment: Following commands;Safety/judgement;Awareness;Attention;Problem solving;Memory                   Current Attention Level: Selective Memory: Decreased short-term memory (repeated cues for body awareness) Following Commands: Follows one step commands with increased time;Follows multi-step commands with increased time Safety/Judgement: Decreased awareness of safety;Decreased awareness of deficits Awareness: Intellectual Problem Solving: Slow processing;Requires verbal cues          Exercises     Shoulder Instructions       General Comments able to wear  Shoes this session with noted improvement in gait progression and w/c mobility    Pertinent Vitals/ Pain       Pain Assessment: No/denies pain  Home Living                                           Prior Functioning/Environment              Frequency  Min 3X/week        Progress Toward Goals  OT Goals(current goals can now be found in the care plan section)  Progress towards OT goals: Progressing toward goals  Acute Rehab OT Goals Patient Stated Goal: to get his own shoes and clothes and to see "faith" OT Goal Formulation: With patient Time For Goal Achievement: 10/30/20 Potential to Achieve Goals: Good  Plan Discharge plan remains appropriate;Frequency remains appropriate    Co-evaluation      Reason for Co-Treatment: Necessary to address cognition/behavior during functional activity;To address functional/ADL transfers;For patient/therapist safety   OT goals addressed during session: ADL's and self-care;Proper use of Adaptive equipment and DME      AM-PAC OT "6 Clicks" Daily Activity     Outcome Measure   Help from another person eating meals?: None Help from another person taking care of personal grooming?: A Little Help from another person toileting, which includes using toliet, bedpan, or urinal?: A Lot Help from another person bathing (including washing, rinsing, drying)?: A Lot Help from another person to put on and taking off regular  upper body clothing?: A Little Help from another person to put on and taking off regular lower body clothing?: A Lot 6 Click Score: 16    End of Session Equipment Utilized During Treatment: Gait belt;Rolling walker;Other (comment) (w/c; L platform RW)  OT Visit Diagnosis: Unsteadiness on feet (R26.81);Other abnormalities of gait and mobility (R26.89);Muscle weakness (generalized) (M62.81);Hemiplegia and hemiparesis;Other symptoms and signs involving cognitive function Hemiplegia - Right/Left: Left Hemiplegia - dominant/non-dominant: Non-Dominant Hemiplegia - caused by: Cerebral infarction   Activity Tolerance Patient tolerated treatment well   Patient Left in bed;with call bell/phone within reach;with bed  alarm set   Nurse Communication Mobility status;Other (comment) (working on setting up bowel program)        Time: 3382-5053 OT Time Calculation (min): 103 min  Charges: OT General Charges $OT Visit: 1 Visit OT Treatments $Self Care/Home Management : 53-67 mins Lenor Derrick., COTA/L Acute Rehabilitation Services [phone]    Barron Schmid 10/24/2020, 2:54 PM

## 2020-10-24 NOTE — Plan of Care (Signed)
  Problem: Education: Goal: Knowledge of General Education information will improve Description: Including pain rating scale, medication(s)/side effects and non-pharmacologic comfort measures Outcome: Progressing   Problem: Health Behavior/Discharge Planning: Goal: Ability to manage health-related needs will improve Outcome: Progressing   Problem: Clinical Measurements: Goal: Ability to maintain clinical measurements within normal limits will improve Outcome: Progressing Goal: Will remain free from infection Outcome: Progressing Goal: Diagnostic test results will improve Outcome: Progressing Goal: Respiratory complications will improve Outcome: Progressing Goal: Cardiovascular complication will be avoided Outcome: Progressing   Problem: Activity: Goal: Risk for activity intolerance will decrease Outcome: Progressing   Problem: Nutrition: Goal: Adequate nutrition will be maintained Outcome: Progressing   Problem: Coping: Goal: Level of anxiety will decrease Outcome: Progressing   Problem: Elimination: Goal: Will not experience complications related to bowel motility Outcome: Progressing Goal: Will not experience complications related to urinary retention Outcome: Progressing   Problem: Pain Managment: Goal: General experience of comfort will improve Outcome: Progressing   Problem: Safety: Goal: Ability to remain free from injury will improve Outcome: Progressing   Problem: Skin Integrity: Goal: Risk for impaired skin integrity will decrease Outcome: Progressing   Problem: Education: Goal: Knowledge of disease or condition will improve Outcome: Progressing Goal: Knowledge of secondary prevention will improve Outcome: Progressing Goal: Knowledge of patient specific risk factors addressed and post discharge goals established will improve Outcome: Progressing Goal: Individualized Educational Video(s) Outcome: Progressing   Problem: Ischemic Stroke/TIA Tissue  Perfusion: Goal: Complications of ischemic stroke/TIA will be minimized Outcome: Progressing   Problem: Ischemic Stroke/TIA Tissue Perfusion: Goal: Complications of ischemic stroke/TIA will be minimized Outcome: Progressing   

## 2020-10-25 NOTE — Plan of Care (Signed)
  Problem: Education: Goal: Knowledge of General Education information will improve Description: Including pain rating scale, medication(s)/side effects and non-pharmacologic comfort measures Outcome: Progressing   Problem: Health Behavior/Discharge Planning: Goal: Ability to manage health-related needs will improve Outcome: Progressing   Problem: Clinical Measurements: Goal: Ability to maintain clinical measurements within normal limits will improve Outcome: Progressing Goal: Will remain free from infection Outcome: Progressing Goal: Diagnostic test results will improve Outcome: Progressing Goal: Respiratory complications will improve Outcome: Progressing Goal: Cardiovascular complication will be avoided Outcome: Progressing   Problem: Activity: Goal: Risk for activity intolerance will decrease Outcome: Progressing   Problem: Nutrition: Goal: Adequate nutrition will be maintained Outcome: Progressing   Problem: Coping: Goal: Level of anxiety will decrease Outcome: Progressing   Problem: Elimination: Goal: Will not experience complications related to bowel motility Outcome: Progressing Goal: Will not experience complications related to urinary retention Outcome: Progressing   Problem: Pain Managment: Goal: General experience of comfort will improve Outcome: Progressing   Problem: Safety: Goal: Ability to remain free from injury will improve Outcome: Progressing   Problem: Skin Integrity: Goal: Risk for impaired skin integrity will decrease Outcome: Progressing   Problem: Education: Goal: Knowledge of disease or condition will improve Outcome: Progressing Goal: Knowledge of secondary prevention will improve Outcome: Progressing Goal: Knowledge of patient specific risk factors addressed and post discharge goals established will improve Outcome: Progressing Goal: Individualized Educational Video(s) Outcome: Progressing   Problem: Ischemic Stroke/TIA Tissue  Perfusion: Goal: Complications of ischemic stroke/TIA will be minimized Outcome: Progressing   Problem: Ischemic Stroke/TIA Tissue Perfusion: Goal: Complications of ischemic stroke/TIA will be minimized Outcome: Progressing   

## 2020-10-25 NOTE — Progress Notes (Signed)
Occupational Therapy Treatment Patient Details Name: Harry [name] MRN: [id number] DOB: 08/10/1975 Today's Date: 10/25/2020    History of present illness 45 yo male presenting to ED with left sided weakness. CT showing early ischemic changes around right MCA but no hypodensity. tPA given on 4/18 @1823 . 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 Awaiting MRI. PMH including heart murmur not on medications.   OT comments  Pt able to laterally scoot to drop arm 3n1 with min guard assist to void BM! , pt required set- up of wash cloths and sueprvision for safety but pt able to complete posterior pericare via lateral leans. Pt additionally completed EOB bathing with bathmit on L hand to facilitate improved AROM in LUE and improve overall L sided attention. Pt currently requires MIN - MOD A for UB ADLs and supervision for LB ADLs from EOB. Pt additionally completed seated BUE therex as indicated below. Pt very encouraged by improved independence today to get to [facility] for toileting. Will continue efforts and follow acutely per POC.   * posted BM schedule in pts room and collaborated with RN and NT to get pt up to [facility] 2x daily ( with therapy assisting with first transfer)   Follow Up Recommendations  SNF    Equipment Recommendations  3 in 1 bedside commode;Wheelchair (measurements OT);Wheelchair cushion (measurements OT)    Recommendations for Other Services      Precautions / Restrictions Precautions Precautions: Fall;Other (comment) Precaution Comments: LUE hemiparesis, subluxing LUE Required Braces or Orthoses: Other Brace Other Brace: L cock up wrist splint Restrictions Weight Bearing Restrictions: No       Mobility Bed Mobility Overal bed mobility: Needs Assistance Bed Mobility: Supine to Sit;Sit to Supine     Supine to sit: Supervision;HOB elevated Sit to supine: Supervision   General bed mobility comments: supervision for bed mobility transitions     Transfers Overall transfer level: Needs assistance   Transfers: Lateral/Scoot Transfers          Lateral/Scoot Transfers: Min guard General transfer comment: pt able to scoot from EOB <>drop arm 3n1 with increased time and min guard assist. pt required increased time and effort to problem solve through transfer ( lowering arm rests on BSC) and MAX cues to stay focused on task    Balance Overall balance assessment: Needs assistance Sitting-balance support: Feet supported;Single extremity supported Sitting balance-Leahy Scale: Fair Sitting balance - Comments: sitting EOB during dynamic reaching tasks with close supervision                                   ADL either performed or assessed with clinical judgement   ADL Overall ADL's : Needs assistance/impaired     Grooming: Wash/dry face;Sitting;Supervision/safety Grooming Details (indicate cue type and reason): sitting EOB to wash his face Upper Body Bathing: Moderate assistance;Sitting Upper Body Bathing Details (indicate cue type and reason): using bathmit on L hand to wash UB; MOD A to wash back Lower Body Bathing: Sitting/lateral leans;Supervison/ safety;Set up Lower Body Bathing Details (indicate cue type and reason): set- up bahtmit on L hand but pt able to wash LB from sitting EOB, pt additionally able to complete simulated LB bathing via posterior pericare ( lateral leans) from [facility] with set- up of wash cloths Upper Body Dressing : Minimal assistance;Sitting Upper Body Dressing Details (indicate cue type and reason): pt able to place LUE in sleeve as gown  using RUE as assist     Toilet Transfer: Min Emergency planning/management officer Details (indicate cue type and reason): pt able to laterally scoot to drop arm 3n1 from EOB with min guard and increased time Toileting- Clothing Manipulation and Hygiene: Supervision/safety;Set up;Sitting/lateral lean Toileting - Clothing Manipulation Details (indicate cue type and  reason): pt able to complete posterior pericare via lateral leans from EOB     Functional mobility during ADLs: Min guard (lateral scoot to drop arm 3n1) General ADL Comments: session focus on BADL reeducation via EOB bathing and toileting, pt additionally able to work on bilateral integration tasks with ball completed therex from EOB     Vision       Perception     Praxis      Cognition Arousal/Alertness: Awake/alert Behavior During Therapy: Flat affect Overall Cognitive Status: Impaired/Different from baseline Area of Impairment: Attention;Memory;Awareness;Problem solving                   Current Attention Level: Selective Memory: Decreased short-term memory (repeated cues for body mechanics)     Awareness: Emergent Problem Solving: Slow processing;Requires verbal cues;Decreased initiation General Comments: pt with improved awareness this session able to problem solve lower arm rests on [facility] with increased time and effort        Exercises Other Exercises Other Exercises: chest press with Bilateral hands on ball x20 reps from EOB with hand over hand assist to maintain grasp on L side Other Exercises: x20 reps of shoulder flexion with bilateral hands on ball needing hand over hand assist to maintain grasp on L side Other Exercises: scapular elevation<>depression with bialteral hands on ball x20 reps   Shoulder Instructions       General Comments spoke with RN about setting up bowel program, with therapy placing pt on BSC at 1030 and RN getting pt to [facility] either after lunch or after dinner. additonally spoke with NT about having pt trial using urinal vs condom catheter, also placed BM schedule in pts room to remind nursing and pt to try to get up to [facility] twice a day in an effort to regulate bowels, additionally issued pt LH sponge for bathing and bathmit for L hand    Pertinent Vitals/ Pain       Pain Assessment: No/denies pain  Home Living                                           Prior Functioning/Environment              Frequency  Min 3X/week        Progress Toward Goals  OT Goals(current goals can now be found in the care plan section)  Progress towards OT goals: Progressing toward goals  Acute Rehab OT Goals Patient Stated Goal: none stated today OT Goal Formulation: With patient Time For Goal Achievement: 10/30/20 Potential to Achieve Goals: Good  Plan Discharge plan remains appropriate;Frequency remains appropriate    Co-evaluation                 AM-PAC OT "6 Clicks" Daily Activity     Outcome Measure   Help from another person eating meals?: None Help from another person taking care of personal grooming?: A Little Help from another person toileting, which includes using toliet, bedpan, or urinal?: A Little Help from another person bathing (including washing, rinsing, drying)?: A  Little Help from another person to put on and taking off regular upper body clothing?: A Little Help from another person to put on and taking off regular lower body clothing?: A Lot 6 Click Score: 18    End of Session    OT Visit Diagnosis: Unsteadiness on feet (R26.81);Other abnormalities of gait and mobility (R26.89);Muscle weakness (generalized) (M62.81);Hemiplegia and hemiparesis;Other symptoms and signs involving cognitive function Hemiplegia - Right/Left: Left Hemiplegia - dominant/non-dominant: Non-Dominant Hemiplegia - caused by: Cerebral infarction   Activity Tolerance Patient tolerated treatment well   Patient Left in bed;with call bell/phone within reach;with bed alarm set   Nurse Communication Mobility status;Other (comment) (please get pt up to [facility] after lunch or dinner, trial using urinal today)        Time: 1000-1114 OT Time Calculation (min): 74 min  Charges: OT General Charges $OT Visit: 1 Visit OT Treatments $Self Care/Home Management : 68-82 mins  Lenor Derrick., COTA/L Acute  Rehabilitation Services [phone]    Barron Schmid 10/25/2020, 11:32 AM

## 2020-10-25 NOTE — Progress Notes (Signed)
Physical Therapy Treatment Patient Details Name: Harry [name] MRN: [id number] DOB: 1975-09-22 Today's Date: 10/25/2020    History of Present Illness 45 yo male presenting to ED with left sided weakness. CT showing early ischemic changes around right MCA but no hypodensity. tPA given on 4/18 @1823 . 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 Awaiting MRI. PMH including heart murmur not on medications.    PT Comments    Pt is making great progress towards his goals today. Phone call with his brother seems to have motivated him to work hard towards his independence. Pt is able to lateral scoot to [name] with min A, performed self pericare with min A for set up, sit<>stand with min A from BSC, balance to wash his hands. Propel w/c for 300 feet, ambulate for 2 bouts of 50 feet ambulation with L platform walker progressing to min guard for the last 10 feet. PT continues to be impressed with rapid progression of skills this week.      Follow Up Recommendations  SNF     Equipment Recommendations  3in1 (PT);Wheelchair cushion (measurements PT);Wheelchair (measurements PT)       Precautions / Restrictions Precautions Precautions: Fall;Other (comment) Precaution Comments: LUE hemiparesis, subluxing LUE Required Braces or Orthoses: Other Brace Other Brace: L cock up wrist splint, dorsiflexion wrap Restrictions Weight Bearing Restrictions: No    Mobility  Bed Mobility Overal bed mobility: Needs Assistance Bed Mobility: Supine to Sit;Sit to Supine     Supine to sit: Supervision Sit to supine: Supervision   General bed mobility comments: supervision for safety    Transfers Overall transfer level: Needs assistance Equipment used: None;Left platform walker Transfers: Sit to/from [name] Pivot Transfers Sit to Stand: Min assist;Min guard   Squat pivot transfers: Min assist     General transfer comment: min A for lateral scoot from bed to [name] as pt was stuck  in the void between the bed and the BSC, able to stand from [name] with min guard, and min A for standing in platform walker for balance and assist with strapping L arm into platform  Ambulation/Gait Ambulation/Gait assistance: Min assist;+2 physical assistance;+2 safety/equipment;Min guard;Mod assist Gait Distance (Feet): 40 Feet (x2)   Gait Pattern/deviations: Step-to pattern;Decreased step length - right;Decreased step length - left;Decreased weight shift to left Gait velocity: decr Gait velocity interpretation: <1.31 ft/sec, indicative of household ambulator General Gait Details: min A for stepping from [name] to sink to wash his hands, min/mod A for multiple LoB with ambulation with platform walker in first 76foot bout of walking, very little cuing provided and LoB due to narrow BoS, discussed outcomes with pt once sitting in w/c. Pt reports "I kept losing my balance." when asked what he could do to correct it pt states "I could keep my feet apart." on second bout of ambulation pt light min A with 1-2x LoB requiring min A for steady, decreased to min guard with last 10 feet of ambulation and pt with no LoB, pt fatigued at end and requests to return to room.   Stairs             45f mobility: Yes Wheelchair propulsion: Right upper extremity;Right lower extremity Wheelchair parts: Needs assistance Distance: 300 Wheelchair Assistance Details (indicate cue type and reason): supervision for safety, cuing for brake management  Modified Rankin (Stroke Patients Only) Modified Rankin (Stroke Patients Only) Pre-Morbid Rankin Score: No symptoms Modified Rankin: Moderately severe disability  Balance Overall balance assessment: Needs assistance Sitting-balance support: Feet supported;Single extremity supported Sitting balance-Leahy Scale: Fair     Standing balance support: Bilateral upper extremity supported;Single extremity supported;No  upper extremity supported Standing balance-Leahy Scale: Fair Standing balance comment: able to static stand with close minguard this session                            Cognition Arousal/Alertness: Awake/alert Behavior During Therapy: Flat affect Overall Cognitive Status: Impaired/Different from baseline Area of Impairment: Following commands;Safety/judgement;Awareness;Attention;Problem solving;Memory                   Current Attention Level: Selective Memory: Decreased short-term memory (repeated cues for body awareness) Following Commands: Follows one step commands with increased time;Follows multi-step commands with increased time Safety/Judgement: Decreased awareness of safety;Decreased awareness of deficits Awareness: Intellectual;Emergent Problem Solving: Slow processing;Requires verbal cues;Requires tactile cues;Difficulty sequencing;Decreased initiation General Comments: pt with better focus today despite distractions in hallway         General Comments General comments (skin integrity, edema, etc.): pt able to perform own pericare in sitting after trying to move his bowels before therapy session      Pertinent Vitals/Pain Pain Assessment: No/denies pain           PT Goals (current goals can now be found in the care plan section) Acute Rehab PT Goals Patient Stated Goal: to get his own shoes and clothes and to see "faith" PT Goal Formulation: With patient/family Time For Goal Achievement: 10/16/20 Potential to Achieve Goals: Good    Frequency    Min 5X/week      PT Plan Current plan remains appropriate       AM-PAC PT "6 Clicks" Mobility   Outcome Measure  Help needed turning from your back to your side while in a flat bed without using bedrails?: A Little Help needed moving from lying on your back to sitting on the side of a flat bed without using bedrails?: A Little Help needed moving to and from a bed to a chair (including a  wheelchair)?: A Little Help needed standing up from a chair using your arms (e.g., wheelchair or bedside chair)?: A Little   Help needed climbing 3-5 steps with a railing? : Total 6 Click Score: 13    End of Session Equipment Utilized During Treatment: Gait belt Activity Tolerance: Patient tolerated treatment well Patient left: Other (comment) (in wheelchair with OT) Nurse Communication: Mobility status PT Visit Diagnosis: Unsteadiness on feet (R26.81);Other abnormalities of gait and mobility (R26.89);Difficulty in walking, not elsewhere classified (R26.2);Hemiplegia and hemiparesis Hemiplegia - Right/Left: Left Hemiplegia - dominant/non-dominant: Non-dominant Hemiplegia - caused by: Cerebral infarction     Time: 0981-1914 PT Time Calculation (min) (ACUTE ONLY): 57 min  Charges:  $Gait Training: 23-37 mins $Therapeutic Activity: 23-37 mins                     Kiana Hollar B. Beverely Risen PT, DPT Acute Rehabilitation Services Pager [phone] Office [phone]    Elon Alas [facility] 10/25/2020, 4:24 PM

## 2020-10-25 NOTE — Progress Notes (Signed)
STROKE TEAM PROGRESS NOTE   SUBJECTIVE (INTERVAL HISTORY) Patient is sitting in the bedside.  Therapist is working with the patient.  Vital signs are stable.  Neurological exam unchanged.  No changes OBJECTIVE Temp:  [97.4 F (36.3 C)-99.1 F (37.3 C)] 98 F (36.7 C) (05/12 1230) Pulse Rate:  [69-77] 77 (05/12 1230) Cardiac Rhythm: Normal sinus rhythm;Heart block (05/12 0700) Resp:  [18-19] 18 (05/12 0832) BP: (129-166)/(85-93) 129/86 (05/12 1230) SpO2:  [98 %-100 %] 100 % (05/12 1230)  No results for input(s): GLUCAP in the last 168 hours. Recent Labs  Lab 10/19/20 0447 10/20/20 0152 10/22/20 0752 10/24/20 0453  NA 136 137 139 139  K 3.4* 3.6 3.2* 3.0*  CL 101 105 103 102  CO2 27 27 28 29   GLUCOSE 96 105* 91 92  BUN 12 15 12 12   CREATININE 0.66 0.70 0.71 0.64  CALCIUM 8.4* 8.1* 8.3* 8.3*   No results for input(s): AST, ALT, ALKPHOS, BILITOT, PROT, ALBUMIN in the last 168 hours. Recent Labs  Lab 10/19/20 0447 10/22/20 0752 10/24/20 0453  WBC 5.8 4.7 4.7  HGB 8.6* 8.5* 8.8*  HCT 25.6* 25.2* 26.6*  MCV 86.8 86.3 86.1  PLT 471* 406* 386        Component Value Date/Time   CHOL 297 (H) 10/02/2020 0607   TRIG 62 10/02/2020 0607   HDL 73 10/02/2020 0607   CHOLHDL 4.1 10/02/2020 0607   VLDL 12 10/02/2020 0607   LDLCALC 212 (H) 10/02/2020 0607   Lab Results  Component Value Date   HGBA1C 5.3 10/02/2020      Component Value Date/Time   LABOPIA NONE DETECTED 10/05/2020 1321   COCAINSCRNUR NONE DETECTED 10/05/2020 1321   LABBENZ NONE DETECTED 10/05/2020 1321   AMPHETMU NONE DETECTED 10/05/2020 1321   THCU NONE DETECTED 10/05/2020 1321   LABBARB NONE DETECTED 10/05/2020 1321    No results for input(s): ETH in the last 168 hours.  MR Brain 1. Acute infarcts in the anterior and posterior right frontal lobe. Associated edema with sulcal effacement. Mild curvilinear susceptibility artifact the regions of infarct likely represent petechial hemorrhage. No mass  occupying acute hemorrhage. 2. Focal susceptibility artifact within a right M2 MCA branch in the region of the more posterior frontal lobe infarct, compatible with thrombus and known occlusion better characterized on recent CTA. 3. 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 4. Large right mastoid effusion  CTA 1. No pulmonary vascular malformation or acute intrathoracic abnormality. 2. Mild cardiomegaly  CT head 1. Acute infarcts in the anterior and posterior right frontal lobe. Associated edema with sulcal effacement. Mild curvilinear susceptibility artifact the regions of infarct likely represent petechial hemorrhage. No mass occupying acute hemorrhage. 2. Focal susceptibility artifact within a right M2 MCA branch in the region of the more posterior frontal lobe infarct, compatible with thrombus and known occlusion better characterized on recent CTA. 3. 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 4. Large right mastoid effusion  PHYSICAL EXAM  Temp:  [97.4 F (36.3 C)-99.1 F (37.3 C)] 98 F (36.7 C) (05/12 1230) Pulse Rate:  [69-77] 77 (05/12 1230) Resp:  [18-19] 18 (05/12 0832) BP: (129-166)/(85-93) 129/86 (05/12 1230) SpO2:  [98 %-100 %] 100 % (05/12 1230)  General -frail middle-age African-American male not in distress Resp: No extra work of breathing Cardiovascular - Regular rhythm and rate.  Neuro - awake alert and oriented  to age, place, time and people. No aphasia, mild dysarthria but fluent language, following all simple  commands. Able to name and repeat.  Mild dysarthria.  no gaze palsy now, but still more right gaze preference, visual field full no hemianopia, PERRL.  Left facial droop. Tongue protrusion to the left.  Right upper and lower extremity at least 4/5, left upper extremity deltoid and bicep 3-/5  , tricep and finger movement 0/5, left lower extremity 3/5 proximal and distal PF/DF 1/5.  Sensation decreased on the left. Right  finger-to-nose intact.  Gait not tested.    ASSESSMENT/PLAN Mr. HERALD VALLIN is a 45 y.o. male with PMH of heart murmur not on medications presented to ER for acute onset left arm and leg weakness, left visual and sensory neglect, right gaze preference, left facial droop nausea/vomiting. tPA given.  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  Discussed with patient regarding risk and benefit of thrombectomy, patient declined thrombectomy.     Stroke: Right MCA infarct, embolic pattern, source unclear but likely cardioembolic (see below)  CT head early ischemic changes right MCA but no hypodensity  CTA head neck was done showed right M2/M3 occlusion.   MRI right moderate MCA cortical infarcts   2D Echo EF 40 to 45%  LE venous Doppler no DVT  TEE EF 35-40%, no clot but LA appendage with SEC and reduced LAA emptying velocity. positive for PFO  TCD bubble study minmal (Spencer degree 2 ) PFO  LE venous Doppler negative for DVT  LDL 212  HgbA1c 5.3  UDS negative  Lovenox for VTE prophylaxis  No antithrombotic prior to admission, now on aspirin 81mg  daily and clopidogrel 75 mg daily DAPT for 3 months and then aspirin alone given LVO intracranially.  Patient counseled to be compliant with his antithrombotic medications  Ongoing aggressive stroke risk factor management  Therapy recommendations: SNF   Disposition: SNF  Cardiomyopathy  EF 40 to 45%  TEE EF 35-40% with LAA SEC and reduced emptying velocity  Cardiology Dr. on board  CTA chest no pulmonary AVM  outpt follow up with Dr. Jacinto Halim  ?? PFO  TEE positive for bilateral shunting suggestive of PFO  TCD bubble study minmal (Spencer degree 2 ) PFO  CTA chest no pulmonary AVM  Follow up with Dr. Jacinto Halim as outpt  Hypertensive urgency . Needed labetalol and Cleviprex IV before IV tPA for BP control . Stable on the high end . Off Cleviprex . On amlodipine 10   Long term BP goal  normotensive  Hyperlipidemia  Home meds: None  LDL 212, goal < 70  Now on Lipitor 80  Continue statin at discharge  Dysphagia  Mild dysarthria  On dysphagia 2 and thin liquid  intermittent cough improving  Depressed mood  Dealing with new stroke deficits and reporting depressed mood  Less participation reported by therapy staff  Celexa initiated   Other Stroke Risk Factors  Hx of heart murmur  Klebsiella UTI  Leukocytosis now 5.8   Other Active Problems   Hospital day # 24  Continue ongoing therapies.  No changes.  Patient is medically stable for transfer to skilled nursing facility when bed available.   Jacinto Halim MD       To contact Stroke Continuity provider, please refer to Delia Heady. After hours, contact General Neurology

## 2020-10-26 LAB — POTASSIUM: Potassium: 3.1 mmol/L — ABNORMAL LOW (ref 3.5–5.1)

## 2020-10-26 MED ORDER — POTASSIUM CHLORIDE 20 MEQ PO PACK
40.0000 meq | PACK | Freq: Two times a day (BID) | ORAL | Status: AC
  Administered 2020-10-26 – 2020-10-27 (×4): 40 meq via ORAL
  Filled 2020-10-26 (×4): qty 2

## 2020-10-26 NOTE — Progress Notes (Signed)
SecureText to on call neuro as follows:  Hi sir, patient with K of 3.0 as of 5/11(latest draw). Was 3.2 on 5/9. Should pt get new draw/replacement orders? ThanksKathlene November RN [phone]

## 2020-10-26 NOTE — Progress Notes (Signed)
Occupational Therapy Treatment Patient Details Name: Harry [name] MRN: [id number] DOB: [birth year] Today's Date: 10/26/2020    History of present illness 45 yo male presenting to ED with left sided weakness. CT showing early ischemic changes around right MCA but no hypodensity. tPA given on 4/18 @1823 . 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 Awaiting MRI. PMH including heart murmur not on medications.   OT comments  Pt making excellent progress this session. Focus of session on neuromuscular reeducation on mat table with able to transition into prone position on propped elbows ~ 2 mins with close min guard assist. Additionally worked on lateral leans to L side from edge of mat with min guard assist. Pt able to complete LUE therex in standing as indicated below. Pt completed w/c mobility greater than a household distance with supervision. Pt completed multiple squat pivot transfers with up to MIN A. Pt able to verbalize need to void bowels this session with pt able to pivot to drop arm 3n1 with min guard assist, set- up for posterior pericare. Pt also able to take steps to sink from [facility] with hand held MIN A to wash hands with supervision.  Pt currently requires MINA for LB ADLs and UB ADLs. DC plan remains appropriate. Will continue POC.   Follow Up Recommendations  SNF    Equipment Recommendations  3 in 1 bedside commode;Wheelchair (measurements OT);Wheelchair cushion (measurements OT)    Recommendations for Other Services      Precautions / Restrictions Precautions Precautions: Fall;Other (comment) Precaution Comments: LUE hemiparesis, subluxing LUE Required Braces or Orthoses: Other Brace Other Brace: L cock up wrist splint, dorsiflexion wrap Restrictions Weight Bearing Restrictions: No       Mobility Bed Mobility Overal bed mobility: Needs Assistance       Supine to sit: Supervision Sit to supine: Supervision   General bed mobility comments:  supervision for safety    Transfers Overall transfer level: Needs assistance Equipment used: None;2 person hand held assist Transfers: Sit to/from [name] Sit to Stand: Min assist;Min guard   Squat pivot transfers: Min guard     General transfer comment: pt completed mulitple transfers during session; squat pivot from EOB>w/c and w/c >drop arm 3n1 with min guard and squat pivot from w/c >mat table x2 with min guard assist. pt additionally completed sit<>stand from w/c with HHA- MINA  from w/c however pt additionally standing from w/c to step to sink with min guard asssit.    Balance Overall balance assessment: Needs assistance Sitting-balance support: Feet supported;Single extremity supported Sitting balance-Leahy Scale: Fair Sitting balance - Comments: pt able to sit EOM with supervision, pt needed light min guard assist when working on lateral leans to L side to return to midline, pt benefit from tech holding RUE as pt tends to try to overcompensate by pushing with RUE vs using LUE. worked on [employer] in prone position on propped elbows with pt able to maintain position ~ 2 mins before fatiguing   Standing balance support: Single extremity supported;During functional activity Standing balance-Leahy Scale: Fair Standing balance comment: standing at sink to wash hands                           ADL either performed or assessed with clinical judgement   ADL Overall ADL's : Needs assistance/impaired     Grooming: Wash/dry hands;Standing;Supervision/safety Grooming Details (indicate cue type and reason): standing at sink to wash hands  Upper Body Dressing : Minimal assistance;Sitting Upper Body Dressing Details (indicate cue type and reason): pt able to place LUE in sleeve as gown using RUE as assist Lower Body Dressing: Minimal assistance;Sitting/lateral leans Lower Body Dressing Details (indicate cue type and reason): light MINA to don L  shoe Toilet Transfer: Minimal assistance;Squat-pivot;BSC Toilet Transfer Details (indicate cue type and reason): pt able to squat pivot from w/c >BSC with light  MIN A going to pts R side Toileting- Clothing Manipulation and Hygiene: Supervision/safety;Set up;Sitting/lateral lean Toileting - Clothing Manipulation Details (indicate cue type and reason): pt able to complete posterior pericare via lateral leans from [facility]     Functional mobility during ADLs: Minimal assistance (squat pivot transfers) General ADL Comments: session focus on neuromuscular reeducation through WB in prone on propped elbows, standing tolerance with LUE functional reach, w/c mobility, toilet transfer, LB dressing, and LUE therex     Vision       Perception     Praxis      Cognition Arousal/Alertness: Awake/alert Behavior During Therapy: Flat affect Overall Cognitive Status: Impaired/Different from baseline Area of Impairment: Following commands;Safety/judgement;Awareness;Attention;Problem solving;Memory                   Current Attention Level: Selective Memory: Decreased short-term memory (repeated cues for attention to LUE during therex) Following Commands: Follows one step commands with increased time;Follows multi-step commands with increased time Safety/Judgement: Decreased awareness of safety;Decreased awareness of deficits Awareness: Intellectual;Emergent Problem Solving: Slow processing;Requires verbal cues;Requires tactile cues;Difficulty sequencing;Decreased initiation General Comments: pt continues to needed repeated cues for LUE awareness and carryover of education        Exercises General Exercises - Upper Extremity Shoulder Horizontal ABduction: AROM;Left;Standing;Other (comment);10 reps (sliding LUE down rails of stairs in ortho gym) Shoulder Horizontal ADduction: AAROM;Left;10 reps;Standing;Other (comment) (sliding LUE down rails of stairs in ortho gym) Other Exercises Other  Exercises: scapular protraction<>retraction with level 1 theraband from sitting EOB with LUE x10 reps, pt needed active assist to achieve full ROM Other Exercises: shoulder flexion with with level 1 theraband from EOM x 10 reps,pt needed active assist to achieve full ROM   Shoulder Instructions       General Comments pt had BM durign session around 4pm - documeneted in flow sheet and alerted NT    Pertinent Vitals/ Pain       Pain Assessment: Faces Faces Pain Scale: Hurts little more Pain Location: LUE during prone exercies Pain Descriptors / Indicators: Sore Pain Intervention(s): Limited activity within patient's tolerance;Monitored during session  Home Living                                          Prior Functioning/Environment              Frequency  Min 3X/week        Progress Toward Goals  OT Goals(current goals can now be found in the care plan section)  Progress towards OT goals: Progressing toward goals  Acute Rehab OT Goals Patient Stated Goal: none stated today Time For Goal Achievement: 10/30/20 Potential to Achieve Goals: Good  Plan Discharge plan remains appropriate;Frequency remains appropriate    Co-evaluation                 AM-PAC OT "6 Clicks" Daily Activity     Outcome Measure   Help from another person eating meals?: None Help  from another person taking care of personal grooming?: A Little Help from another person toileting, which includes using toliet, bedpan, or urinal?: A Little Help from another person bathing (including washing, rinsing, drying)?: A Little Help from another person to put on and taking off regular upper body clothing?: A Little Help from another person to put on and taking off regular lower body clothing?: A Little 6 Click Score: 19    End of Session Equipment Utilized During Treatment: Gait belt;Other (comment) (w/c; drop arm 3n1)  OT Visit Diagnosis: Unsteadiness on feet (R26.81);Other  abnormalities of gait and mobility (R26.89);Muscle weakness (generalized) (M62.81);Hemiplegia and hemiparesis;Other symptoms and signs involving cognitive function Hemiplegia - Right/Left: Left Hemiplegia - dominant/non-dominant: Non-Dominant Hemiplegia - caused by: Cerebral infarction   Activity Tolerance Patient tolerated treatment well   Patient Left in bed;with call bell/phone within reach;with bed alarm set   Nurse Communication Mobility status;Other (comment) (had BM on 3n1)        Time: 1433-1601 OT Time Calculation (min): 88 min  Charges: OT General Charges $OT Visit: 1 Visit OT Treatments $Self Care/Home Management : 38-52 mins $Neuromuscular Re-education: 38-52 mins  Lenor Derrick., COTA/L Acute Rehabilitation Services [phone]    Barron Schmid 10/26/2020, 4:41 PM

## 2020-10-26 NOTE — Progress Notes (Signed)
Physical Therapy Treatment Patient Details Name: Harry [name] MRN: [id number] DOB: [birth year] Today's Date: 10/26/2020    History of Present Illness 45 yo male presenting to ED with left sided weakness. CT showing early ischemic changes around right MCA but no hypodensity. tPA given on 4/18 @1823 . 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 Awaiting MRI. PMH including heart murmur not on medications.    PT Comments    Pt continues to be motivated about progressing his mobility. Pt reports having BM earlier so did not start session with transfer to [facility] at beginning of session. Performed FIST balance measurement outcome and scored 56/56, pt at basement of BERG balance test, will look for more appropriate outcome measure for next week. Pt continues to need min guard-min A for pivot transfers and sit<>stand. Pt ambulates in straight line with min A and modA for turning. Use of ACE wrap for dorsiflexion has become unsafe due to increased gait speed. AFO ordered for use next week.     Follow Up Recommendations  SNF     Equipment Recommendations  3in1 (PT);Wheelchair cushion (measurements PT);Wheelchair (measurements PT)       Precautions / Restrictions Precautions Precautions: Fall;Other (comment) Precaution Comments: LUE hemiparesis, subluxing LUE Required Braces or Orthoses: Other Brace Other Brace: L cock up wrist splint, dorsiflexion wrap Restrictions Weight Bearing Restrictions: No    Mobility  Bed Mobility Overal bed mobility: Needs Assistance Bed Mobility: Supine to Sit;Sit to Supine     Supine to sit: Supervision Sit to supine: Supervision   General bed mobility comments: supervision for safety    Transfers Overall transfer level: Needs assistance Equipment used: None;Left platform walker Transfers: Sit to/from Stand;Squat Pivot Transfers Sit to Stand: Min assist;Min guard   Squat pivot transfers: Min guard     General transfer comment: min  guard for lateral pivot transfers from bed to straight back chair and from w/c back to bed, min A for power up to RW for placement if L arm in platform and velcroing it down 4x sit<>stand during sessio  Ambulation/Gait Ambulation/Gait assistance: Min assist;+2 safety/equipment;Mod assist Gait Distance (Feet): 40 Feet Assistive device: Left platform walker Gait Pattern/deviations: Step-to pattern;Decreased step length - right;Decreased step length - left;Decreased weight shift to left Gait velocity: decr Gait velocity interpretation: <1.31 ft/sec, indicative of household ambulator General Gait Details: pt able to walk out of room today with min A, requires modA for turning to his R in hallway, working on being able to steer platform walker with shoulder, initially using ACE wrap to facilitate dorsiflexion, however with increased velocity pt slipping, AFO ordered for gait progression next week     [name] mobility: Yes Wheelchair propulsion: Right upper extremity;Right lower extremity Wheelchair Assistance Details (indicate cue type and reason): supervision for safety, cuing for brake management  Modified Rankin (Stroke Patients Only) Modified Rankin (Stroke Patients Only) Pre-Morbid Rankin Score: No symptoms Modified Rankin: Moderately severe disability     Balance Overall balance assessment: Needs assistance Sitting-balance support: Feet supported;Single extremity supported Sitting balance-Leahy Scale: Fair     Standing balance support: Bilateral upper extremity supported;Single extremity supported;No upper extremity supported Standing balance-Leahy Scale: Fair Standing balance comment: able to static stand with close minguard this session                 Standardized Balance Assessment Standardized Balance Assessment :  (FIST 56/56)          Cognition Arousal/Alertness: Awake/alert Behavior  During Therapy: Flat  affect Overall Cognitive Status: Impaired/Different from baseline Area of Impairment: Following commands;Safety/judgement;Awareness;Attention;Problem solving;Memory                   Current Attention Level: Selective Memory: Decreased short-term memory (repeated cues for body awareness) Following Commands: Follows one step commands with increased time;Follows multi-step commands with increased time Safety/Judgement: Decreased awareness of safety;Decreased awareness of deficits Awareness: Intellectual;Emergent Problem Solving: Slow processing;Requires verbal cues;Requires tactile cues;Difficulty sequencing;Decreased initiation General Comments: pt continues to improve distractability with mobility and improved sequencing with minimal cuing         General Comments General comments (skin integrity, edema, etc.): pt reports having BM at 9 am so did not transfer to [facility] before session today      Pertinent Vitals/Pain Pain Assessment: No/denies pain           PT Goals (current goals can now be found in the care plan section) Acute Rehab PT Goals Patient Stated Goal: to get his own shoes and clothes and to see "faith" PT Goal Formulation: With patient/family Time For Goal Achievement: 10/16/20 Potential to Achieve Goals: Good Progress towards PT goals: Progressing toward goals    Frequency    Min 5X/week      PT Plan Current plan remains appropriate       AM-PAC PT "6 Clicks" Mobility   Outcome Measure  Help needed turning from your back to your side while in a flat bed without using bedrails?: A Little Help needed moving from lying on your back to sitting on the side of a flat bed without using bedrails?: A Little Help needed moving to and from a bed to a chair (including a wheelchair)?: A Little Help needed standing up from a chair using your arms (e.g., wheelchair or bedside chair)?: A Little   Help needed climbing 3-5 steps with a railing? : Total 6 Click  Score: 13    End of Session Equipment Utilized During Treatment: Gait belt Activity Tolerance: Patient tolerated treatment well Patient left: in bed;with call bell/phone within reach;with bed alarm set Nurse Communication: Mobility status PT Visit Diagnosis: Unsteadiness on feet (R26.81);Other abnormalities of gait and mobility (R26.89);Difficulty in walking, not elsewhere classified (R26.2);Hemiplegia and hemiparesis Hemiplegia - Right/Left: Left Hemiplegia - dominant/non-dominant: Non-dominant Hemiplegia - caused by: Cerebral infarction     Time: 3300-7622 PT Time Calculation (min) (ACUTE ONLY): 50 min  Charges:  $Therapeutic Activity: 8-22 mins $Neuromuscular Re-education: 8-22 mins $Physical Performance Test: 8-22 mins                     Torianne Laflam B. Beverely Risen PT, DPT Acute Rehabilitation Services Pager [phone] Office [phone]    Elon Alas Fleet 10/26/2020, 1:22 PM

## 2020-10-26 NOTE — TOC Progression Note (Signed)
Transition of Care [facility]) - Progression Note    Patient Details  Name: Harry [name] MRN: [id number] Date of Birth: [birth year]  Transition of Care [facility]) CM/SW Contact  [name], [place] Phone Number: 10/26/2020, 3:06 PM  Clinical Narrative:   CSW continuing to follow for SNF placement. No LOG bed offers at this time.     Expected Discharge Plan: Skilled Nursing Facility Barriers to Discharge: Continued Medical Work up,Inadequate or no insurance  Expected Discharge Plan and Services Expected Discharge Plan: Skilled Nursing Facility     Post Acute Care Choice: Skilled Nursing Facility Living arrangements for the past 2 months: Apartment                                       Social Determinants of Health (SDOH) Interventions    Readmission Risk Interventions No flowsheet data found.

## 2020-10-26 NOTE — Progress Notes (Addendum)
STROKE TEAM PROGRESS NOTE   SUBJECTIVE (INTERVAL HISTORY) Patient is sitting in the bedside.     Vital signs are stable.  Neurological exam unchanged.  No changes OBJECTIVE Temp:  [97.8 F (36.6 C)-99 F (37.2 C)] 98.8 F (37.1 C) (05/13 1258) Pulse Rate:  [71-79] 74 (05/13 1258) Cardiac Rhythm: Heart block (05/13 0700) Resp:  [13-19] 18 (05/13 1258) BP: (119-146)/(74-99) 138/88 (05/13 1258) SpO2:  [98 %-100 %] 100 % (05/13 1258)  No results for input(s): GLUCAP in the last 168 hours. Recent Labs  Lab 10/20/20 0152 10/22/20 0752 10/24/20 0453  NA 137 139 139  K 3.6 3.2* 3.0*  CL 105 103 102  CO2 27 28 29   GLUCOSE 105* 91 92  BUN 15 12 12   CREATININE 0.70 0.71 0.64  CALCIUM 8.1* 8.3* 8.3*   No results for input(s): AST, ALT, ALKPHOS, BILITOT, PROT, ALBUMIN in the last 168 hours. Recent Labs  Lab 10/22/20 0752 10/24/20 0453  WBC 4.7 4.7  HGB 8.5* 8.8*  HCT 25.2* 26.6*  MCV 86.3 86.1  PLT 406* 386        Component Value Date/Time   CHOL 297 (H) 10/02/2020 0607   TRIG 62 10/02/2020 0607   HDL 73 10/02/2020 0607   CHOLHDL 4.1 10/02/2020 0607   VLDL 12 10/02/2020 0607   LDLCALC 212 (H) 10/02/2020 0607   Lab Results  Component Value Date   HGBA1C 5.3 10/02/2020      Component Value Date/Time   LABOPIA NONE DETECTED 10/05/2020 1321   COCAINSCRNUR NONE DETECTED 10/05/2020 1321   LABBENZ NONE DETECTED 10/05/2020 1321   AMPHETMU NONE DETECTED 10/05/2020 1321   THCU NONE DETECTED 10/05/2020 1321   LABBARB NONE DETECTED 10/05/2020 1321     MR Brain 10/02/20 1. Acute infarcts in the anterior and posterior right frontal lobe. Associated edema with sulcal effacement. Mild curvilinear susceptibility artifact the regions of infarct likely represent petechial hemorrhage. No mass occupying acute hemorrhage. 2. Focal susceptibility artifact within a right M2 MCA branch in the region of the more posterior frontal lobe infarct, compatible with thrombus and known  occlusion better characterized on recent CTA. 3. 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 4. Large right mastoid effusion  CTA Chest 4/26/202  1. No pulmonary vascular malformation or acute intrathoracic abnormality. 2. Mild cardiomegaly  CT head 1. Acute infarcts in the anterior and posterior right frontal lobe. Associated edema with sulcal effacement. Mild curvilinear susceptibility artifact the regions of infarct likely represent petechial hemorrhage. No mass occupying acute hemorrhage. 2. Focal susceptibility artifact within a right M2 MCA branch in the region of the more posterior frontal lobe infarct, compatible with thrombus and known occlusion better characterized on recent CTA. 3. 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 4. Large right mastoid effusion  PHYSICAL EXAM  Temp:  [97.8 F (36.6 C)-99 F (37.2 C)] 98.8 F (37.1 C) (05/13 1258) Pulse Rate:  [71-79] 74 (05/13 1258) Resp:  [13-19] 18 (05/13 1258) BP: (119-146)/(74-99) 138/88 (05/13 1258) SpO2:  [98 %-100 %] 100 % (05/13 1258)  General -frail middle-age African-American male not in distress Resp: No extra work of breathing Cardiovascular - Regular rhythm and rate.  Neuro - awake alert and oriented  to age, place, time and people. No aphasia, mild dysarthria but fluent language, following all simple commands. Able to name and repeat.  Mild dysarthria.  no gaze palsy now, but still more right gaze preference, visual field full no hemianopia, PERRL.  Left facial  droop. Tongue protrusion to the left.  Right upper and lower extremity at least 4/5, left upper extremity deltoid and bicep 3-/5  , tricep and finger movement 0/5, left lower extremity 3/5 proximal and distal PF/DF 1/5.  Sensation decreased on the left. Right finger-to-nose intact.  Gait not tested.    ASSESSMENT/PLAN [name] is a 45 y.o. male with PMH of heart murmur not on medications presented to ER for  acute onset left arm and leg weakness, left visual and sensory neglect, right gaze preference, left facial droop nausea/vomiting. tPA given.  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  Discussed with patient regarding risk and benefit of thrombectomy, patient declined thrombectomy.     Stroke: Right MCA infarct, embolic pattern, source unclear but likely cardioembolic (see below)  CT head early ischemic changes right MCA but no hypodensity  CTA head neck was done showed right M2/M3 occlusion.   MRI right moderate MCA cortical infarcts   2D Echo EF 40 to 45%  LE venous Doppler no DVT  TEE EF 35-40%, no clot but LA appendage with SEC and reduced LAA emptying velocity. positive for PFO  TCD bubble study minmal (Spencer degree 2 ) PFO  LE venous Doppler negative for DVT  LDL 212  HgbA1c 5.3  UDS negative  Lovenox for VTE prophylaxis  No antithrombotic prior to admission, now on aspirin 81mg  daily and clopidogrel 75 mg daily DAPT for 3 months and then aspirin alone given LVO intracranially.  Patient counseled to be compliant with his antithrombotic medications  Ongoing aggressive stroke risk factor management Continue ongoing therapies.  No changes.  Patient is medically stable for transfer to skilled nursing facility when bed available.        Therapy recommendations: SNF   Disposition: SNF  Cardiomyopathy  EF 40 to 45%  TEE EF 35-40% with LAA SEC and reduced emptying velocity  Cardiology Dr. on board  CTA chest no pulmonary AVM  outpt follow up with Dr. Jacinto Halim  ?? PFO  TEE positive for bilateral shunting suggestive of PFO  TCD bubble study minmal (Spencer degree 2 ) PFO  CTA chest no pulmonary AVM  Follow up with Dr. Jacinto Halim as outpt  Hypertensive urgency . Needed labetalol and Cleviprex IV before IV tPA for BP control . Stable on the high end . Off Cleviprex . On amlodipine 10   Long term BP goal  normotensive  Hyperlipidemia  Home meds: None  LDL 212, goal < 70  Now on Lipitor 80  Continue statin at discharge  Dysphagia  Mild dysarthria  On dysphagia 2 and thin liquid  intermittent cough improving  Depressed mood  Dealing with new stroke deficits and reporting depressed mood  Less participation reported by therapy staff  Celexa initiated   Other Stroke Risk Factors  Hx of heart murmur  Klebsiella UTI  Leukocytosis now 5.8   Other Active Problems   Hospital day # 25  I have personally obtained history,examined this patient, reviewed notes, independently viewed imaging studies, participated in medical decision making and plan of care.ROS completed by me personally and pertinent positives fully documented  I have made any additions or clarifications directly to the above note. Agree with note above.   26, MD Medical Director [facility] Stroke [facility] Pager: [phone] 10/26/2020 1:26 PM        To contact Stroke Continuity provider, please refer to 10/28/2020. After hours, contact General Neurology

## 2020-10-27 MED ORDER — POTASSIUM CHLORIDE CRYS ER 20 MEQ PO TBCR
40.0000 meq | EXTENDED_RELEASE_TABLET | ORAL | Status: AC
  Administered 2020-10-27 – 2020-10-28 (×2): 40 meq via ORAL
  Filled 2020-10-27: qty 2
  Filled 2020-10-27: qty 4

## 2020-10-27 NOTE — Progress Notes (Signed)
Orthopedic Tech Progress Note Patient Details:  Harry [name] 05/30/1976 [id number] Called order into [facility] Patient ID: [name], male   DOB: March 30, 1976, 45 y.o.   MRN: [id number]   [name] 10/27/2020, 8:56 AM

## 2020-10-27 NOTE — Progress Notes (Addendum)
STROKE TEAM PROGRESS NOTE   SUBJECTIVE (INTERVAL HISTORY) Patient is sitting in the bedside.     Vital signs are stable.  Neurological exam unchanged.    OBJECTIVE Temp:  [98 F (36.7 C)-98.9 F (37.2 C)] 98.7 F (37.1 C) (05/14 0740) Pulse Rate:  [74-82] 74 (05/14 0740) Cardiac Rhythm: Normal sinus rhythm (05/14 0733) Resp:  [17-19] 19 (05/14 0740) BP: (131-146)/(79-100) 146/96 (05/14 0740) SpO2:  [98 %-100 %] 98 % (05/14 0740)  No results for input(s): GLUCAP in the last 168 hours. Recent Labs  Lab 10/22/20 0752 10/24/20 0453 10/26/20 2142  NA 139 139  --   K 3.2* 3.0* 3.1*  CL 103 102  --   CO2 28 29  --   GLUCOSE 91 92  --   BUN 12 12  --   CREATININE 0.71 0.64  --   CALCIUM 8.3* 8.3*  --    No results for input(s): AST, ALT, ALKPHOS, BILITOT, PROT, ALBUMIN in the last 168 hours. Recent Labs  Lab 10/22/20 0752 10/24/20 0453  WBC 4.7 4.7  HGB 8.5* 8.8*  HCT 25.2* 26.6*  MCV 86.3 86.1  PLT 406* 386        Component Value Date/Time   CHOL 297 (H) 10/02/2020 0607   TRIG 62 10/02/2020 0607   HDL 73 10/02/2020 0607   CHOLHDL 4.1 10/02/2020 0607   VLDL 12 10/02/2020 0607   LDLCALC 212 (H) 10/02/2020 0607   Lab Results  Component Value Date   HGBA1C 5.3 10/02/2020      Component Value Date/Time   LABOPIA NONE DETECTED 10/05/2020 1321   COCAINSCRNUR NONE DETECTED 10/05/2020 1321   LABBENZ NONE DETECTED 10/05/2020 1321   AMPHETMU NONE DETECTED 10/05/2020 1321   THCU NONE DETECTED 10/05/2020 1321   LABBARB NONE DETECTED 10/05/2020 1321     MR Brain 10/02/20 1. Acute infarcts in the anterior and posterior right frontal lobe. Associated edema with sulcal effacement. Mild curvilinear susceptibility artifact the regions of infarct likely represent petechial hemorrhage. No mass occupying acute hemorrhage. 2. Focal susceptibility artifact within a right M2 MCA branch in the region of the more posterior frontal lobe infarct, compatible with thrombus and known  occlusion better characterized on recent CTA. 3. 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 4. Large right mastoid effusion  CTA Chest 4/26/202  1. No pulmonary vascular malformation or acute intrathoracic abnormality. 2. Mild cardiomegaly  CT head 1. Acute infarcts in the anterior and posterior right frontal lobe. Associated edema with sulcal effacement. Mild curvilinear susceptibility artifact the regions of infarct likely represent petechial hemorrhage. No mass occupying acute hemorrhage. 2. Focal susceptibility artifact within a right M2 MCA branch in the region of the more posterior frontal lobe infarct, compatible with thrombus and known occlusion better characterized on recent CTA. 3. 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 4. Large right mastoid effusion  PHYSICAL EXAM  Temp:  [98 F (36.7 C)-98.9 F (37.2 C)] 98.7 F (37.1 C) (05/14 0740) Pulse Rate:  [74-82] 74 (05/14 0740) Resp:  [17-19] 19 (05/14 0740) BP: (131-146)/(79-100) 146/96 (05/14 0740) SpO2:  [98 %-100 %] 98 % (05/14 0740)  General -frail middle-age African-American male not in distress Resp: No extra work of breathing Cardiovascular - Regular rhythm and rate.  Neuro - awake alert and oriented  to age, place, time and people. No aphasia, mild dysarthria but fluent language, following all simple commands. Able to name and repeat.  Mild dysarthria.  no gaze palsy now,  but still more right gaze preference, visual field full no hemianopia, PERRL.  Left facial droop. Tongue protrusion to the left.  Right upper and lower extremity at least 4/5, left upper extremity deltoid and bicep 3-/5  , tricep and finger movement 0/5, left lower extremity 3/5 proximal and distal PF/DF 1/5.  Sensation decreased on the left. Right finger-to-nose intact.  Gait not tested.    ASSESSMENT/PLAN Mr. NILE PRISK is a 45 y.o. male with PMH of heart murmur not on medications presented to ER for  acute onset left arm and leg weakness, left visual and sensory neglect, right gaze preference, left facial droop nausea/vomiting. tPA given.  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  Discussed with patient regarding risk and benefit of thrombectomy, patient declined thrombectomy.     Stroke: Right MCA infarct, embolic pattern, source unclear but likely cardioembolic (see below)  CT head early ischemic changes right MCA but no hypodensity  CTA head neck was done showed right M2/M3 occlusion.   MRI right moderate MCA cortical infarcts   2D Echo EF 40 to 45%  LE venous Doppler no DVT  TEE EF 35-40%, no clot but LA appendage with SEC and reduced LAA emptying velocity. positive for PFO  TCD bubble study minmal (Spencer degree 2 ) PFO  LE venous Doppler negative for DVT  LDL 212  HgbA1c 5.3  UDS negative  Lovenox for VTE prophylaxis  No antithrombotic prior to admission, now on aspirin 81mg  daily and clopidogrel 75 mg daily DAPT for 3 months and then aspirin alone given LVO intracranially.  Patient counseled to be compliant with his antithrombotic medications  Ongoing aggressive stroke risk factor management Continue ongoing therapies.  No changes.  Patient is medically stable for transfer to skilled nursing facility when bed available.        Therapy recommendations: SNF   Disposition: SNF  Cardiomyopathy  EF 40 to 45%  TEE EF 35-40% with LAA SEC and reduced emptying velocity  Cardiology Dr. on board  CTA chest no pulmonary AVM  outpt follow up with Dr. Jacinto Halim  ?? PFO  TEE positive for bilateral shunting suggestive of PFO  TCD bubble study minmal (Spencer degree 2 ) PFO  CTA chest no pulmonary AVM  Follow up with Dr. Jacinto Halim as outpt  Hypertensive urgency . Needed labetalol and Cleviprex IV before IV tPA for BP control . Stable on the high end . Off Cleviprex . On amlodipine 10   Long term BP goal  normotensive  Hyperlipidemia  Home meds: None  LDL 212, goal < 70  Now on Lipitor 80  Continue statin at discharge  Dysphagia  Mild dysarthria  On dysphagia 2 and thin liquid  intermittent cough improving  Depressed mood  Dealing with new stroke deficits and reporting depressed mood  Less participation reported by therapy staff  Celexa initiated   Other Stroke Risk Factors  Hx of heart murmur    Other Active Problems   Hospital day # 26   ATTENDING NOTE: I reviewed above note and agree with the assessment and plan.   Neuro unchanged. Continue current management. K still low at 3.1, will give supplement. Recheck on Monday along with magnesium. Pending SNF.  For detailed assessment and plan, please refer to above as I have made changes wherever appropriate.   Monday, MD PhD Stroke Neurology 10/27/2020 10:43 PM         To contact Stroke Continuity provider, please refer  to [email or web address] After hours, contact General Neurology

## 2020-10-28 NOTE — Progress Notes (Signed)
  Speech Language Pathology Treatment: Dysphagia  Patient Details Name: Harry [name] MRN: [id number] DOB: March 24, 1976 Today's Date: 10/28/2020 Time: 7782-4235 SLP Time Calculation (min) (ACUTE ONLY): 8 min  Assessment / Plan / Recommendation Clinical Impression  Patient seen for f/u diet tolerance assessment. Patient alert and pleasant, agreeable to po trials under SLP skilled observation. Able to consume regular texture solids and thin liquids without overt indication of aspiration. Mastication remains mildly prolonged but functional with independent use of lingual sweep to clear mild buccal residuals. SLP provided patient with cup following each bite to facilitate clearance as well. Overall, patient tolerating current diet, but states that he continues to order primarily soft solids that he can easily masticate. Dysphagia 3 diet remains appropriate however patient does have potential to advance to regular textures with improved oral strength . Will continue to f/u.    HPI HPI: Harry [name] is a 45 y.o. African American male with PMH of heart murmur not on medications presented to ER for acute onset left arm and leg weakness, left neglect, right gaze preference, nausea/vomiting.  Patient fully awake alert orientated, per patient he stated that he was reading from his cell phone around 5:25 PM, he had acute onset not feeling well, nausea and vomiting.  However, he denies any arm or leg weakness numbness.  His brother is in the same house but given room with him, brother saw him last at 42 AM at baseline, but then saw him after he had nausea vomiting and noticed that he was not able to move on the left side with left facial droop.      SLP Plan  Continue with current plan of care       Recommendations  Diet recommendations: Dysphagia 3 (mechanical soft);Thin liquid Liquids provided via: Cup;Straw Medication Administration: Whole meds with liquid Supervision: Patient able to self  feed;Intermittent supervision to cue for compensatory strategies Compensations: Slow rate;Small sips/bites;Minimize environmental distractions;Lingual sweep for clearance of pocketing Postural Changes and/or Swallow Maneuvers: Seated upright 90 degrees                Oral Care Recommendations: Oral care BID Follow up Recommendations: Inpatient Rehab SLP Visit Diagnosis: Dysphagia, oropharyngeal phase (R13.12);Dysarthria and anarthria (R47.1);Cognitive communication deficit (R41.841) Plan: Continue with current plan of care       GO              Maquita Sandoval MA, CCC-SLP    [name] 10/28/2020, 10:03 AM

## 2020-10-28 NOTE — Progress Notes (Addendum)
STROKE TEAM PROGRESS NOTE   SUBJECTIVE (INTERVAL HISTORY) Patient seen in room. Lying in bed and appears comfortable.  Vital signs are stable.  Neurological exam unchanged.    OBJECTIVE Temp:  [97.4 F (36.3 C)-98.7 F (37.1 C)] 98.7 F (37.1 C) (05/15 1206) Pulse Rate:  [74-80] 79 (05/15 1206) Cardiac Rhythm: Normal sinus rhythm;Heart block (05/15 0700) Resp:  [18-20] 20 (05/15 1206) BP: (126-157)/(87-97) 154/87 (05/15 1206) SpO2:  [98 %-100 %] 99 % (05/15 1206)  No results for input(s): GLUCAP in the last 168 hours. Recent Labs  Lab 10/22/20 0752 10/24/20 0453 10/26/20 2142  NA 139 139  --   K 3.2* 3.0* 3.1*  CL 103 102  --   CO2 28 29  --   GLUCOSE 91 92  --   BUN 12 12  --   CREATININE 0.71 0.64  --   CALCIUM 8.3* 8.3*  --    No results for input(s): AST, ALT, ALKPHOS, BILITOT, PROT, ALBUMIN in the last 168 hours. Recent Labs  Lab 10/22/20 0752 10/24/20 0453  WBC 4.7 4.7  HGB 8.5* 8.8*  HCT 25.2* 26.6*  MCV 86.3 86.1  PLT 406* 386        Component Value Date/Time   CHOL 297 (H) 10/02/2020 0607   TRIG 62 10/02/2020 0607   HDL 73 10/02/2020 0607   CHOLHDL 4.1 10/02/2020 0607   VLDL 12 10/02/2020 0607   LDLCALC 212 (H) 10/02/2020 0607   Lab Results  Component Value Date   HGBA1C 5.3 10/02/2020      Component Value Date/Time   LABOPIA NONE DETECTED 10/05/2020 1321   COCAINSCRNUR NONE DETECTED 10/05/2020 1321   LABBENZ NONE DETECTED 10/05/2020 1321   AMPHETMU NONE DETECTED 10/05/2020 1321   THCU NONE DETECTED 10/05/2020 1321   LABBARB NONE DETECTED 10/05/2020 1321     MR Brain 10/02/20 1. Acute infarcts in the anterior and posterior right frontal lobe. Associated edema with sulcal effacement. Mild curvilinear susceptibility artifact the regions of infarct likely represent petechial hemorrhage. No mass occupying acute hemorrhage. 2. Focal susceptibility artifact within a right M2 MCA branch in the region of the more posterior frontal lobe infarct,  compatible with thrombus and known occlusion better characterized on recent CTA. 3. 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 4. Large right mastoid effusion  CTA Chest 4/26/202  1. No pulmonary vascular malformation or acute intrathoracic abnormality. 2. Mild cardiomegaly  CT head 1. Acute infarcts in the anterior and posterior right frontal lobe. Associated edema with sulcal effacement. Mild curvilinear susceptibility artifact the regions of infarct likely represent petechial hemorrhage. No mass occupying acute hemorrhage. 2. Focal susceptibility artifact within a right M2 MCA branch in the region of the more posterior frontal lobe infarct, compatible with thrombus and known occlusion better characterized on recent CTA. 3. 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 4. Large right mastoid effusion  PHYSICAL EXAM  Temp:  [97.4 F (36.3 C)-98.7 F (37.1 C)] 98.7 F (37.1 C) (05/15 1206) Pulse Rate:  [74-80] 79 (05/15 1206) Resp:  [18-20] 20 (05/15 1206) BP: (126-157)/(87-97) 154/87 (05/15 1206) SpO2:  [98 %-100 %] 99 % (05/15 1206)  General -frail middle-age African-American male not in distress Resp: No extra work of breathing Cardiovascular - Regular rhythm and rate.  Neuro - awake alert and oriented  to age, place, time and people. No aphasia, mild dysarthria but fluent language, following all simple commands. Able to name and repeat.  Mild dysarthria.  no gaze  palsy now, but still more right gaze preference, visual field full no hemianopia, PERRL.  Left facial droop. Tongue protrusion to the left.  Right upper and lower extremity at least 4/5, left upper extremity deltoid and bicep 3-/5  , tricep and finger movement 0/5, left lower extremity 3/5 proximal and distal PF/DF 1/5.  Sensation decreased on the left. Right finger-to-nose intact.  Gait not tested.    ASSESSMENT/PLAN [name] is a 45 y.o. male with PMH of heart murmur not  on medications presented to ER for acute onset left arm and leg weakness, left visual and sensory neglect, right gaze preference, left facial droop nausea/vomiting. tPA given.  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  Discussed with patient regarding risk and benefit of thrombectomy, patient declined thrombectomy.     Stroke: Right MCA infarct, embolic pattern, source unclear but likely cardioembolic (see below)  CT head early ischemic changes right MCA but no hypodensity  CTA head neck was done showed right M2/M3 occlusion.   MRI right moderate MCA cortical infarcts   2D Echo EF 40 to 45%  LE venous Doppler no DVT  TEE EF 35-40%, no clot but LA appendage with SEC and reduced LAA emptying velocity. positive for PFO  TCD bubble study minmal (Spencer degree 2 ) PFO  LE venous Doppler negative for DVT  LDL 212  HgbA1c 5.3  UDS negative  Lovenox for VTE prophylaxis  No antithrombotic prior to admission, now on aspirin 81mg  daily and clopidogrel 75 mg daily DAPT for 3 months and then aspirin alone given LVO intracranially.  Patient counseled to be compliant with his antithrombotic medications  Ongoing aggressive stroke risk factor management Continue ongoing therapies.  No changes.  Patient is medically stable for transfer to skilled nursing facility when bed available.    Therapy recommendations: SNF   Disposition: SNF  Cardiomyopathy  EF 40 to 45%  TEE EF 35-40% with LAA SEC and reduced emptying velocity  Cardiology Dr. on board  CTA chest no pulmonary AVM  outpt follow up with Dr. Jacinto Halim  ?? PFO  TEE positive for bilateral shunting suggestive of PFO  TCD bubble study minmal (Spencer degree 2 ) PFO  CTA chest no pulmonary AVM  Follow up with Dr. Jacinto Halim as outpt  Hypertensive urgency . Needed labetalol and Cleviprex IV before IV tPA for BP control . Stable on the high end . Off Cleviprex . On amlodipine 10   Long term BP goal  normotensive  Hyperlipidemia  Home meds: None  LDL 212, goal < 70  Now on Lipitor 80  Continue statin at discharge  Dysphagia  Mild dysarthria  On dysphagia 2 and thin liquid  intermittent cough improving  Depressed mood  Dealing with new stroke deficits and reporting depressed mood  Less participation reported by therapy staff  Celexa initiated   Other Stroke Risk Factors  Hx of heart murmur    Other Active Problems   Hospital day # 27  ATTENDING NOTE: I reviewed above note and agree with the assessment and plan. Pt was seen and examined.   Pt lying in bed, awake alert, LLE much improved in strength, and LUE also improving. Will need PT/OT re-eval to see if pt can go home with home PT/OT.   28, MD PhD Stroke Neurology 10/28/2020 6:07 PM        To contact Stroke Continuity provider, please refer to 10/30/2020. After hours, contact General Neurology

## 2020-10-29 LAB — BASIC METABOLIC PANEL
Anion gap: 7 (ref 5–15)
BUN: 8 mg/dL (ref 6–20)
CO2: 27 mmol/L (ref 22–32)
Calcium: 8.3 mg/dL — ABNORMAL LOW (ref 8.9–10.3)
Chloride: 103 mmol/L (ref 98–111)
Creatinine, Ser: 0.62 mg/dL (ref 0.61–1.24)
GFR, Estimated: >60 mL/min (ref >60)
Glucose, Bld: 93 mg/dL (ref 70–99)
Potassium: 3.6 mmol/L (ref 3.5–5.1)
Sodium: 137 mmol/L (ref 135–145)

## 2020-10-29 LAB — CBC
HCT: 27.5 % — ABNORMAL LOW (ref 39.0–52.0)
Hemoglobin: 9.2 g/dL — ABNORMAL LOW (ref 13.0–17.0)
MCH: 28.8 pg (ref 26.0–34.0)
MCHC: 33.5 g/dL (ref 30.0–36.0)
MCV: 85.9 fL (ref 80.0–100.0)
Platelets: 341 10*3/uL (ref 150–400)
RBC: 3.2 MIL/uL — ABNORMAL LOW (ref 4.22–5.81)
RDW: 13.5 % (ref 11.5–15.5)
WBC: 5.9 10*3/uL (ref 4.0–10.5)
nRBC: 0 % (ref 0.0–0.2)

## 2020-10-29 LAB — MAGNESIUM: Magnesium: 1.5 mg/dL — ABNORMAL LOW (ref 1.7–2.4)

## 2020-10-29 MED ORDER — MAGNESIUM SULFATE 2 GM/50ML IV SOLN
2.0000 g | Freq: Once | INTRAVENOUS | Status: AC
  Administered 2020-10-29: 2 g via INTRAVENOUS
  Filled 2020-10-29: qty 50

## 2020-10-29 NOTE — Plan of Care (Signed)
  Problem: Education: Goal: Knowledge of General Education information will improve Description: Including pain rating scale, medication(s)/side effects and non-pharmacologic comfort measures Outcome: Progressing   Problem: Clinical Measurements: Goal: Ability to maintain clinical measurements within normal limits will improve Outcome: Progressing Goal: Will remain free from infection Outcome: Progressing Goal: Diagnostic test results will improve Outcome: Progressing Goal: Respiratory complications will improve Outcome: Progressing Goal: Cardiovascular complication will be avoided Outcome: Progressing   Problem: Safety: Goal: Ability to remain free from injury will improve Outcome: Progressing   Problem: Ischemic Stroke/TIA Tissue Perfusion: Goal: Complications of ischemic stroke/TIA will be minimized Outcome: Progressing

## 2020-10-29 NOTE — Progress Notes (Signed)
Physical Therapy Treatment Patient Details Name: Harry [name] MRN: [id number] DOB: 08-10-1975 Today's Date: 10/29/2020    History of Present Illness 45 yo male presenting to ED with left sided weakness. CT showing early ischemic changes around right MCA but no hypodensity. tPA given on 4/18 @1823 . 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 Awaiting MRI. PMH including heart murmur not on medications.    PT Comments    Pt's family brought clothing and shoes over the weekend. Educated pt on need to transition to a schedule more in line with that of his home environment. Pt able to transfer to and from [facility] with min guard. Pt is able to perform pericare with set up. Assisted pt with dressing. Afterward pt able to transfer to wheelchair with light min A. Once in hallway facilitated ambulation using platform walker. Pt requiring modAx2 for providing support to progress L LE movement with improved weightshift and hip rotation and knee flexion and extension. Pt also able to perform stepping at 4" and 6" steps. Contacted ortho tech and hopefully AFO will be fitted to pt shoes today. Pt continues to make progression to discharge.     Follow Up Recommendations  SNF     Equipment Recommendations  3in1 (PT);Wheelchair cushion (measurements PT);Wheelchair (measurements PT)       Precautions / Restrictions Precautions Precautions: Fall;Other (comment) Precaution Comments: LUE hemiparesis, subluxing LUE Required Braces or Orthoses: Other Brace Other Brace: L cock up wrist splint, dorsiflexion wrap Restrictions Weight Bearing Restrictions: No    Mobility  Bed Mobility Overal bed mobility: Needs Assistance Bed Mobility: Supine to Sit;Sit to Supine     Supine to sit: Supervision Sit to supine: Supervision   General bed mobility comments: supervision for safety    Transfers Overall transfer level: Needs assistance Equipment used: None;Left platform walker Transfers:  Sit to/from Stand;Squat Pivot Transfers Sit to Stand: Min assist;Min guard   Squat pivot transfers: Min guard     General transfer comment: min guard for squat pivot to and from BSC, and min A-min guard for multiple sit<>stand from wheelchair  Ambulation/Gait Ambulation/Gait assistance: Min assist;+2 safety/equipment;Mod assist;+2 physical assistance (+3 chair follow, PT for facilitation of weightshift and PT for support) Gait Distance (Feet): 40 Feet (40 foot platform walker, 8 feet without AD, used windowsill to walk from end of bed to chair) Assistive device: Left platform walker Gait Pattern/deviations: Step-to pattern;Decreased step length - right;Decreased step length - left;Decreased weight shift to left Gait velocity: decr Gait velocity interpretation: <1.8 ft/sec, indicate of risk for recurrent falls General Gait Details: Pt able to progress ambulation today, with A for weightshift to L and for disassociation of hip and knee synergy, continues to have L knee flexion with weightshift and able to maintain weightbearing with muscle [name] Wheelchair mobility: Yes Wheelchair propulsion: Right upper extremity;Right lower extremity Wheelchair parts: Supervision/cueing Distance: 300 Wheelchair Assistance Details (indicate cue type and reason): supervision for safety, cuing for brake management  Modified Rankin (Stroke Patients Only) Modified Rankin (Stroke Patients Only) Pre-Morbid Rankin Score: No symptoms Modified Rankin: Moderately severe disability     Balance Overall balance assessment: Needs assistance Sitting-balance support: Feet supported;Single extremity supported Sitting balance-Leahy Scale: Fair     Standing balance support: Bilateral upper extremity supported;Single extremity supported;No upper extremity supported Standing balance-Leahy Scale: Fair Standing balance comment: able to static stand with close minguard  this session  Standardized Balance Assessment Standardized Balance Assessment :  (FIST 56/56)          Cognition Arousal/Alertness: Awake/alert Behavior During Therapy: Flat affect Overall Cognitive Status: Impaired/Different from baseline Area of Impairment: Following commands;Safety/judgement;Awareness;Attention;Problem solving;Memory                   Current Attention Level: Selective Memory: Decreased short-term memory (continuest to need cues for awareness of his L dside with movement especially his L UE) Following Commands: Follows one step commands with increased time;Follows multi-step commands with increased time Safety/Judgement: Decreased awareness of safety;Decreased awareness of deficits Awareness: Emergent Problem Solving: Slow processing;Requires verbal cues;Requires tactile cues;Difficulty sequencing;Decreased initiation General Comments: pt continues to improve at keeping on task, requires, maximal multimodal cuing for advancing movement      Exercises Other Exercises Other Exercises: stepping up to 4" steps 10x each side Other Exercises: stepping up to 6" step x10 each foot    General Comments General comments (skin integrity, edema, etc.): Pt assisted with dressing after use of BSC, also encouraged to stay up in recliner, in attempt to facilitate home environment      Pertinent Vitals/Pain Pain Assessment: No/denies pain           PT Goals (current goals can now be found in the care plan section) Acute Rehab PT Goals Patient Stated Goal: to get his own shoes and clothes and to see "faith" PT Goal Formulation: With patient/family Time For Goal Achievement: 10/16/20 Potential to Achieve Goals: Good Progress towards PT goals: Progressing toward goals    Frequency    Min 5X/week      PT Plan Current plan remains appropriate       AM-PAC PT "6 Clicks" Mobility   Outcome Measure  Help needed turning from your  back to your side while in a flat bed without using bedrails?: A Little Help needed moving from lying on your back to sitting on the side of a flat bed without using bedrails?: A Little Help needed moving to and from a bed to a chair (including a wheelchair)?: A Little Help needed standing up from a chair using your arms (e.g., wheelchair or bedside chair)?: A Little   Help needed climbing 3-5 steps with a railing? : Total 6 Click Score: 13    End of Session Equipment Utilized During Treatment: Gait belt Activity Tolerance: Patient tolerated treatment well Patient left: with call bell/phone within reach;in chair;with chair alarm set Nurse Communication: Mobility status PT Visit Diagnosis: Unsteadiness on feet (R26.81);Other abnormalities of gait and mobility (R26.89);Difficulty in walking, not elsewhere classified (R26.2);Hemiplegia and hemiparesis Hemiplegia - Right/Left: Left Hemiplegia - dominant/non-dominant: Non-dominant Hemiplegia - caused by: Cerebral infarction     Time: 3335-4562 PT Time Calculation (min) (ACUTE ONLY): 64 min  Charges:  $Gait Training: 8-22 mins $Therapeutic Exercise: 8-22 mins $Therapeutic Activity: 8-22 mins $Self Care/Home Management: 8-22                     Gaspare Netzel B. Beverely Risen PT, DPT Acute Rehabilitation Services Pager [phone] Office [phone]    Elon Alas Fleet 10/29/2020, 3:16 PM

## 2020-10-29 NOTE — Progress Notes (Signed)
Occupational Therapy Treatment Patient Details Name: Harry [name] MRN: [id number] DOB: [birth year] Today's Date: 10/29/2020    History of present illness 44 yo male presenting to ED with left sided weakness. CT showing early ischemic changes around right MCA but no hypodensity. tPA given on 4/18 @1823 . 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 Awaiting MRI. PMH including heart murmur not on medications.   OT comments  Pt making excellent progress completing household distance functional mobility with no AD and MIN A. Pt also makign excellent progress with pivoting to [facility] with min guard assist for toileting needing set- up of wash cloths for pericare. Session focus on neuromuscular reeducation to LUE with pt in prone position on mat table on propped elbows, however pt reports 6/10 pain unable to tolerate WB thru LUE while pt reached with RUE. Downgraded task to have pt standing at sink and reaching for items with RUE on L side with pt WB'ing through LUE and LLE to retrieve items. Pt continues to present with decreased activity tolerance, impaired safety awareness and impaired attention to task but overall making great strides towards OT goals.     Follow Up Recommendations  SNF    Equipment Recommendations  3 in 1 bedside commode;Wheelchair (measurements OT);Wheelchair cushion (measurements OT)    Recommendations for Other Services      Precautions / Restrictions Precautions Precautions: Fall;Other (comment) Precaution Comments: LUE hemiparesis, subluxing LUE Required Braces or Orthoses: Other Brace Other Brace: L cock up wrist splint, dorsiflexion wrap Restrictions Weight Bearing Restrictions: No       Mobility Bed Mobility Overal bed mobility: Needs Assistance Bed Mobility: Supine to Sit;Sit to Supine     Supine to sit: Supervision Sit to supine: Supervision   General bed mobility comments: supervision for safety    Transfers Overall transfer  level: Needs assistance Equipment used: None;Left platform walker Transfers: Sit to/from [name] Pivot Transfers Sit to Stand: Min guard   Squat pivot transfers: Min guard     General transfer comment: pt standing from w/c, EOM, and EOB with min guard assist. squat pivot to BSC with min gaurd assist to pts R and L side    Balance Overall balance assessment: Needs assistance Sitting-balance support: Feet supported;Single extremity supported Sitting balance-Leahy Scale: Fair     Standing balance support: Single extremity supported;During functional activity Standing balance-Leahy Scale: Fair Standing balance comment: standing reaching activities in standingn with pt needing MIN A when reaching out of BOS to retrieve items on L side with RUE                           ADL either performed or assessed with clinical judgement   ADL Overall ADL's : Needs assistance/impaired     Grooming: Wash/dry hands;Standing;Min guard Grooming Details (indicate cue type and reason): standing at sink to wash hands     Lower Body Bathing: Sitting/lateral leans;Supervison/ safety;Set up Lower Body Bathing Details (indicate cue type and reason): simulated via posterior pericare from [facility] with set- up assist of wash cloths     Lower Body Dressing: Maximal assistance;Sitting/lateral leans Lower Body Dressing Details (indicate cue type and reason): to don shoes with laces Toilet Transfer: Min guard;Squat-pivot;BSC Toilet Transfer Details (indicate cue type and reason): minguad for squat pivot transfer from EOB to drop arm 3n1 to pts R side Toileting- Clothing Manipulation and Hygiene: Supervision/safety;Set up;Sitting/lateral lean Toileting - Clothing Manipulation Details (indicate cue type and reason):  pt able to complete posterior pericare via lateral leans from [facility]     Functional mobility during ADLs: Minimal assistance General ADL Comments: session focus on neuromuscular reeducation  through WB in prone on propped elbows and during standing reaching activity with pt WB'ing thru LUE and LLE, w/c mobility, toilet transfer, LB dressing, and functional mobility with no AD     Vision       Perception     Praxis      Cognition Arousal/Alertness: Awake/alert Behavior During Therapy: Flat affect Overall Cognitive Status: Impaired/Different from baseline Area of Impairment: Following commands;Safety/judgement;Awareness;Attention;Problem solving;Memory                   Current Attention Level: Selective Memory: Decreased short-term memory (repeated cues for LUE awareness during mobility) Following Commands: Follows one step commands with increased time;Follows multi-step commands with increased time Safety/Judgement: Decreased awareness of safety;Decreased awareness of deficits Awareness: Emergent Problem Solving: Slow processing;Requires verbal cues;Requires tactile cues;Difficulty sequencing;Decreased initiation General Comments: pt seemed more flat this session needing increased encouragment and repeated cues for awareness of LUE        Exercises Other Exercises Other Exercises: prone on mat on propped elbows ~ 2 mins totals Other Exercises: neuromuscular reeducation with pt standing at sink WB'ing thru LLE and LUE and using RUE to reach laterally across body to retrieve items with RUE ~ 4 mins   Shoulder Instructions       General Comments      Pertinent Vitals/ Pain       Pain Assessment: Faces Pain Score: 6  Pain Location: L shoulder when in prone on mat Pain Descriptors / Indicators: Sore Pain Intervention(s): Limited activity within patient's tolerance;Monitored during session;Repositioned  Home Living                                          Prior Functioning/Environment              Frequency  Min 3X/week        Progress Toward Goals  OT Goals(current goals can now be found in the care plan section)   Progress towards OT goals: Progressing toward goals  Acute Rehab OT Goals Patient Stated Goal: none stated OT Goal Formulation: With patient Time For Goal Achievement: 10/30/20 Potential to Achieve Goals: Good  Plan Discharge plan remains appropriate;Frequency remains appropriate    Co-evaluation                 AM-PAC OT "6 Clicks" Daily Activity     Outcome Measure   Help from another person eating meals?: None Help from another person taking care of personal grooming?: A Little Help from another person toileting, which includes using toliet, bedpan, or urinal?: A Little Help from another person bathing (including washing, rinsing, drying)?: A Little Help from another person to put on and taking off regular upper body clothing?: A Little Help from another person to put on and taking off regular lower body clothing?: A Little 6 Click Score: 19    End of Session Equipment Utilized During Treatment: Gait belt;Other (comment) (L platform RW, w/c, BSC)  OT Visit Diagnosis: Unsteadiness on feet (R26.81);Other abnormalities of gait and mobility (R26.89);Muscle weakness (generalized) (M62.81);Hemiplegia and hemiparesis;Other symptoms and signs involving cognitive function Hemiplegia - Right/Left: Left Hemiplegia - dominant/non-dominant: Non-Dominant Hemiplegia - caused by: Cerebral infarction   Activity Tolerance Patient tolerated  treatment well   Patient Left in bed;with call bell/phone within reach;with bed alarm set   Nurse Communication Mobility status        Time: 1624-4695 OT Time Calculation (min): 77 min  Charges: OT General Charges $OT Visit: 1 Visit OT Treatments $Self Care/Home Management : 23-37 mins $Neuromuscular Re-education: 38-52 mins  Lenor Derrick., COTA/L Acute Rehabilitation Services [phone]    Barron Schmid 10/29/2020, 4:30 PM

## 2020-10-29 NOTE — Progress Notes (Addendum)
STROKE TEAM PROGRESS NOTE   SUBJECTIVE (INTERVAL HISTORY) No acute events S/p therapy, Tele discontinued. Lying in bed in NAD.  Awaiting SNF placement, making progress with therapy.   OBJECTIVE Temp:  [97.4 F (36.3 C)-99.6 F (37.6 C)] 97.4 F (36.3 C) (05/16 1206) Pulse Rate:  [74-95] 95 (05/16 1206) Cardiac Rhythm: Normal sinus rhythm (05/16 0924) Resp:  [15-20] 18 (05/16 1206) BP: (109-156)/(73-99) 109/73 (05/16 1206) SpO2:  [99 %-100 %] 99 % (05/16 1206)  No results for input(s): GLUCAP in the last 168 hours. Recent Labs  Lab 10/24/20 0453 10/26/20 2142 10/29/20 0453  NA 139  --  137  K 3.0* 3.1* 3.6  CL 102  --  103  CO2 29  --  27  GLUCOSE 92  --  93  BUN 12  --  8  CREATININE 0.64  --  0.62  CALCIUM 8.3*  --  8.3*  MG  --   --  1.5*   No results for input(s): AST, ALT, ALKPHOS, BILITOT, PROT, ALBUMIN in the last 168 hours. Recent Labs  Lab 10/24/20 0453 10/29/20 0453  WBC 4.7 5.9  HGB 8.8* 9.2*  HCT 26.6* 27.5*  MCV 86.1 85.9  PLT 386 341        Component Value Date/Time   CHOL 297 (H) 10/02/2020 0607   TRIG 62 10/02/2020 0607   HDL 73 10/02/2020 0607   CHOLHDL 4.1 10/02/2020 0607   VLDL 12 10/02/2020 0607   LDLCALC 212 (H) 10/02/2020 0607   Lab Results  Component Value Date   HGBA1C 5.3 10/02/2020      Component Value Date/Time   LABOPIA NONE DETECTED 10/05/2020 1321   COCAINSCRNUR NONE DETECTED 10/05/2020 1321   LABBENZ NONE DETECTED 10/05/2020 1321   AMPHETMU NONE DETECTED 10/05/2020 1321   THCU NONE DETECTED 10/05/2020 1321   LABBARB NONE DETECTED 10/05/2020 1321     MR Brain 10/02/20 1. Acute infarcts in the anterior and posterior right frontal lobe. Associated edema with sulcal effacement. Mild curvilinear susceptibility artifact the regions of infarct likely represent petechial hemorrhage. No mass occupying acute hemorrhage. 2. Focal susceptibility artifact within a right M2 MCA branch in the region of the more posterior frontal  lobe infarct, compatible with thrombus and known occlusion better characterized on recent CTA. 3. 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 4. Large right mastoid effusion  CTA Chest 4/26/202  1. No pulmonary vascular malformation or acute intrathoracic abnormality. 2. Mild cardiomegaly  CT head 1. Acute infarcts in the anterior and posterior right frontal lobe. Associated edema with sulcal effacement. Mild curvilinear susceptibility artifact the regions of infarct likely represent petechial hemorrhage. No mass occupying acute hemorrhage. 2. Focal susceptibility artifact within a right M2 MCA branch in the region of the more posterior frontal lobe infarct, compatible with thrombus and known occlusion better characterized on recent CTA. 3. 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 4. Large right mastoid effusion  PHYSICAL EXAM  Temp:  [97.4 F (36.3 C)-99.6 F (37.6 C)] 97.4 F (36.3 C) (05/16 1206) Pulse Rate:  [74-95] 95 (05/16 1206) Resp:  [15-20] 18 (05/16 1206) BP: (109-156)/(73-99) 109/73 (05/16 1206) SpO2:  [99 %-100 %] 99 % (05/16 1206)  General -frail middle-age African-American male not in distress Resp: No extra work of breathing Cardiovascular - Regular rhythm and rate.  Neuro - awake alert and oriented  to age, place, time and people. No aphasia, mild dysarthria but fluent language, following all simple commands. Able to name  and repeat.  Mild dysarthria.  no gaze palsy now, but still more right gaze preference, visual field full no hemianopia, PERRL.  Left facial droop. Tongue protrusion to the left.  Right upper and lower extremity at least 4/5, left upper extremity deltoid and bicep 3-/5  , tricep and finger movement 0/5, left lower extremity 3/5 proximal and distal PF/DF 1/5.  Sensation decreased on the left. Right finger-to-nose intact.  Gait not tested.    ASSESSMENT/PLAN [name] is a 45 y.o. male with PMH of  heart murmur not on medications presented to ER for acute onset left arm and leg weakness, left visual and sensory neglect, right gaze preference, left facial droop nausea/vomiting. tPA given.  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  Discussed with patient regarding risk and benefit of thrombectomy, patient declined thrombectomy.     Stroke: Right MCA infarct, embolic pattern, source unclear but likely cardioembolic (see below)  CT head early ischemic changes right MCA but no hypodensity  CTA head neck was done showed right M2/M3 occlusion.   MRI right moderate MCA cortical infarcts   2D Echo EF 40 to 45%  LE venous Doppler no DVT  TEE EF 35-40%, no clot but LA appendage with SEC and reduced LAA emptying velocity. positive for PFO  TCD bubble study minmal (Spencer degree 2 ) PFO  LE venous Doppler negative for DVT  LDL 212  HgbA1c 5.3  UDS negative  Lovenox for VTE prophylaxis  No antithrombotic prior to admission, now on aspirin 81mg  daily and clopidogrel 75 mg daily DAPT for 3 months and then aspirin alone given LVO intracranially.  Patient counseled to be compliant with his antithrombotic medications  Ongoing aggressive stroke risk factor management Continue ongoing therapies.  No changes.  Patient is medically stable for transfer to skilled nursing facility when bed available.    Therapy recommendations: SNF   Disposition: SNF  Cardiomyopathy  EF 40 to 45%  TEE EF 35-40% with LAA SEC and reduced emptying velocity  Cardiology Dr. on board  CTA chest no pulmonary AVM  outpt follow up with Dr. Jacinto Halim  ?? PFO  TEE positive for bilateral shunting suggestive of PFO  TCD bubble study minmal (Spencer degree 2 ) PFO  CTA chest no pulmonary AVM  Follow up with Dr. Jacinto Halim as outpt  Hypertensive urgency . Needed labetalol and Cleviprex IV before IV tPA for BP control . Stable on the high end . Off Cleviprex . On amlodipine 10    Long term BP goal normotensive  Hyperlipidemia  Home meds: None  LDL 212, goal < 70  Now on Lipitor 80  Continue statin at discharge  Dysphagia  Mild dysarthria  On dysphagia 2 and thin liquid  intermittent cough improving  Depressed mood  Dealing with new stroke deficits and reporting depressed mood  Less participation reported by therapy staff  Celexa initiated   Other Stroke Risk Factors  Hx of heart murmur  Other Active Problems   Hospital day # 28  Delila A Bailey-Modzik, NP-C   ATTENDING NOTE: I reviewed above note and agree with the assessment and plan. Pt was seen and examined.   Patient neuro stable, no acute event overnight.  Left lower extremity much improved.  Discussed with PT/OT, given no 24/7 support at home (brother working at home, also has a 73 yo), so far PT/OT still recommend SNF.  However, patient received intensive PT/OT so far on the floor.  Today labs stable, potassium 3.6 however magnesium 1.5, will give magnesium supplement.  For detailed assessment and plan, please refer to above as I have made changes wherever appropriate.   Marvel Plan, MD PhD Stroke Neurology 10/29/2020 5:12 PM     To contact Stroke Continuity provider, please refer to [email or web address]. After hours, contact General Neurology

## 2020-10-30 NOTE — Progress Notes (Signed)
Occupational Therapy Treatment Patient Details Name: Harry [name] MRN: [id number] DOB: [birth year] Today's Date: 10/30/2020    History of present illness 45 yo male presenting to ED with left sided weakness. CT showing early ischemic changes around right MCA but no hypodensity. tPA given on 4/18 @1823 . 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 Awaiting MRI. PMH including heart murmur not on medications.   OT comments  Session conducted in collaboration with OTR for goal update. Pt making good progess towards OT goals with session focusing on BADL reeducation, and functional mobility. Pt able to ambulate ~ 30 ft with quad cane with MIN A +1. Pt completed dressing EOB with MOD A for UB/LB dressing. Pt with new L ADO needing total A to son L shoe, will work on using elastic shoe laces for R shoe as compensatory method for LB Dressing. Pt completed squat pivot transfer from EOB <>BSC with min guard assist to void bowels. DC plan currently remains appropriate, but working to maximize pts independence to potentially get pt home to his brothers house.   Follow Up Recommendations  SNF    Equipment Recommendations  3 in 1 bedside commode;Wheelchair (measurements OT);Wheelchair cushion (measurements OT)    Recommendations for Other Services      Precautions / Restrictions Precautions Precautions: Fall;Other (comment) Precaution Comments: LUE hemiparesis, subluxing LUE Required Braces or Orthoses: Other Brace Other Brace: L cock up wrist splint, L AFO Restrictions Weight Bearing Restrictions: No       Mobility Bed Mobility Overal bed mobility: Needs Assistance Bed Mobility: Supine to Sit;Sit to Supine     Supine to sit: Supervision Sit to supine: Supervision   General bed mobility comments: cues for awareness to LUE during bed mobility    Transfers Overall transfer level: Needs assistance Equipment used: None;Quad cane Transfers: Sit to/from Sit to Stand: Min guard;Min assist   Squat pivot transfers: Min assist     General transfer comment: Min guard for squat pivot transfer from EOB >BSC, min A to rise from EOB and w/c with cues for hand placement    Balance Overall balance assessment: Needs assistance Sitting-balance support: Feet supported;Single extremity supported Sitting balance-Leahy Scale: Fair     Standing balance support: Single extremity supported;During functional activity Standing balance-Leahy Scale: Fair                             ADL either performed or assessed with clinical judgement   ADL Overall ADL's : Needs assistance/impaired                 Upper Body Dressing : Maximal assistance;Moderate assistance;Sitting Upper Body Dressing Details (indicate cue type and reason): to don [place] shirt,  MAX cues for sequencing as pt wanting to don RUE Lower Body Dressing: Moderate assistance;Sit to/from stand Lower Body Dressing Details (indicate cue type and reason): to don pants/ brief and shoes with L AFO Toilet Transfer: Minimal assistance;RW;Ambulation;Min guard;BSC;Squat-pivot Toilet Transfer Details (indicate cue type and reason): pt able to squat pivot to [facility] with min guard assist to pts L side, pt additionally able to complete simulated toilet transfer via functional mobility with quad cane and MIN A +1 Toileting- Clothing Manipulation and Hygiene: Supervision/safety;Set up;Sitting/lateral lean Toileting - Clothing Manipulation Details (indicate cue type and reason): pt able to complete posterior pericare via lateral leans from [facility]     Functional mobility during ADLs: Minimal assistance;Cane (quad  cane) General ADL Comments: session conducted in collaboration with OTR for goal update, session focus BADL reeducation, and functional mobility     Vision       Perception     Praxis      Cognition Arousal/Alertness: Awake/alert Behavior During Therapy: Flat  affect Overall Cognitive Status: Impaired/Different from baseline Area of Impairment: Following commands;Safety/judgement;Awareness;Attention;Problem solving;Memory                   Current Attention Level: Selective Memory: Decreased short-term memory (repeated cues for attention to LUE) Following Commands: Follows one step commands with increased time;Follows multi-step commands with increased time Safety/Judgement: Decreased awareness of safety;Decreased awareness of deficits Awareness: Emergent Problem Solving: Slow processing;Requires verbal cues;Requires tactile cues;Difficulty sequencing;Decreased initiation General Comments: increased cues needed to attend to LUE during ADLs, especially dressing        Exercises     Shoulder Instructions       General Comments      Pertinent Vitals/ Pain       Pain Assessment: No/denies pain  Home Living                                          Prior Functioning/Environment              Frequency  Min 3X/week        Progress Toward Goals  OT Goals(current goals can now be found in the care plan section)  Progress towards OT goals: Progressing toward goals  Acute Rehab OT Goals Patient Stated Goal: to walk more OT Goal Formulation: With patient Time For Goal Achievement: 11/13/20 (OTR to update goals) Potential to Achieve Goals: Good  Plan Discharge plan remains appropriate;Frequency remains appropriate    Co-evaluation                 AM-PAC OT "6 Clicks" Daily Activity     Outcome Measure   Help from another person eating meals?: None Help from another person taking care of personal grooming?: A Little Help from another person toileting, which includes using toliet, bedpan, or urinal?: A Little Help from another person bathing (including washing, rinsing, drying)?: A Little Help from another person to put on and taking off regular upper body clothing?: A Little Help from  another person to put on and taking off regular lower body clothing?: A Lot 6 Click Score: 18    End of Session Equipment Utilized During Treatment: Gait belt;Other (comment) (quad cane, w/c, BSC)  OT Visit Diagnosis: Unsteadiness on feet (R26.81);Other abnormalities of gait and mobility (R26.89);Muscle weakness (generalized) (M62.81);Hemiplegia and hemiparesis;Other symptoms and signs involving cognitive function Hemiplegia - Right/Left: Left Hemiplegia - dominant/non-dominant: Non-Dominant   Activity Tolerance Patient tolerated treatment well   Patient Left in chair;with call bell/phone within reach;with chair alarm set   Nurse Communication Mobility status        Time: 0623-7628 OT Time Calculation (min): 47 min  Charges: OT General Charges $OT Visit: 1 Visit OT Treatments $Self Care/Home Management : 38-52 mins  Lenor Derrick., COTA/L Acute Rehabilitation Services [phone]    Barron Schmid 10/30/2020, 1:49 PM

## 2020-10-30 NOTE — Progress Notes (Signed)
Occupational Therapy Treatment Patient Details Name: Harry [name] MRN: [id number] DOB: [birth year] Today's Date: 10/30/2020    History of present illness 45 yo male presenting to ED with left sided weakness. CT showing early ischemic changes around right MCA but no hypodensity. tPA given on 4/18 @1823 . 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 Awaiting MRI. PMH including heart murmur not on medications.   OT comments  Pt seen in collaboration with COTA to assess progress, update goals and manage plan of care. Pt has made excellent progress with OT over the last 2 weeks, meeting 6 out of 7 goals. Increased functional movement and awareness with LUE with decreased complaints of pain. Pt moving our of flexor synergy pattern with increased strength, especially proximally. Using wrist cock-up splint to help support wrist during functional tasks. Gross grasp  Present with initiation of digit extension however pt does not demonstrate functional in-hand manipulation skills at this time.  Increasing awareness of LUE, attempting to use as functional assist with ADL tasks, however requires mod VC at times to incorporate into mobility to reduce risk of injury. Pt ambulated @ 100 ft (AFO in L shoe) with use of LBQC and facilitation under L axilla with min A at times due to L foot drag when distracted/fatigued. Requires mod A with dressing, min A with toilet transfers and toileting.   Recommend continued focus on ADL retraining, neuro muscular facilitation and increase time OOB. Discussed establishing schedule with COTA and pt regarding increasing time OOB in wc in addition to continuing to address bowel/bladder program with nursing. Goals were updated to overall modified independent level.  Will continue to follow acutely.  Follow Up Recommendations  SNF    Equipment Recommendations  3 in 1 bedside commode;Wheelchair (measurements OT);Wheelchair cushion (measurements OT)     Recommendations for Other Services      Precautions / Restrictions Precautions Precautions: Fall Precaution Comments: LUE hemiparesis, subluxing LUE Required Braces or Orthoses: Other Brace Other Brace: L cock up wrist splint, L AFO Restrictions Weight Bearing Restrictions: No       Mobility Bed Mobility Overal bed mobility: Needs Assistance Bed Mobility: Supine to Sit;Sit to Supine   Sidelying to sit: Supervision Supine to sit: Supervision Sit to supine: Supervision   General bed mobility comments: cues for awareness to LUE during bed mobility    Transfers Overall transfer level: Needs assistance Equipment used: None;Quad cane Transfers: Sit to/from [employer] Sit to Stand: Min guard   Squat pivot transfers: Min guard     General transfer comment: Min guard for squat pivot transfer    Balance Overall balance assessment: Needs assistance Sitting-balance support: Feet supported Sitting balance-Leahy Scale: Fair Sitting balance - Comments: note posterior lean when distracted during ADL tasks   Standing balance support: Single extremity supported;During functional activity Standing balance-Leahy Scale: Fair                             ADL either performed or assessed with clinical judgement   ADL Overall ADL's : Needs assistance/impaired                 Upper Body Dressing : Maximal assistance;Moderate assistance;Sitting Upper Body Dressing Details (indicate cue type and reason): to don [place] shirt,  MAX cues for sequencing as pt wanting to don RUE Lower Body Dressing: Moderate assistance;Sit to/from stand Lower Body Dressing Details (indicate cue type and reason): to don pants/  brief and shoes with L AFO Toilet Transfer: Minimal assistance;RW;Ambulation;Min guard;BSC;Squat-pivot Toilet Transfer Details (indicate cue type and reason): pt able to squat pivot to [facility] with min guard assist to pts L side, pt additionally able to  complete simulated toilet transfer via functional mobility with quad cane and MIN A +1 Toileting- Clothing Manipulation and Hygiene: Supervision/safety;Set up;Sitting/lateral lean Toileting - Clothing Manipulation Details (indicate cue type and reason): pt able to complete posterior pericare via lateral leans from [facility]     Functional mobility during ADLs: Minimal assistance;Cane (quad cane) General ADL Comments: Session conducted in collaboration with COTA. Pt requiring Mod A with UB dressing for compensatory strategies adn physical assist to begin and problem solve during task     Vision   Additional Comments: impaired visual attention   Perception     Praxis      Cognition Arousal/Alertness: Awake/alert Behavior During Therapy: Flat affect Overall Cognitive Status: Impaired/Different from baseline Area of Impairment: Following commands;Safety/judgement;Awareness;Attention;Problem solving;Memory                   Current Attention Level: Selective Memory: Decreased recall of precautions;Decreased short-term memory Following Commands: Follows one step commands consistently Safety/Judgement: Decreased awareness of safety;Decreased awareness of deficits Awareness: Emergent Problem Solving: Slow processing General Comments: improved attention and awareness of L environment since last treatment session        Exercises Other Exercises Other Exercises: Scapular strengthening using PNF technique in R sidelying. Increased strength in elevation; scapular winging noted. Pt able to facilitate anterior and posterior depression however not as strong as elevation. Other Exercises: PNF technique for trunk strengthening in R sidelying -  elevating hip and depressing shoulder x 10 Other Exercises: In supine completed D1/D2 diagonals for L shoulder/UE strengthening x 10 Other Exercises: Scapular protraction x 10 in supine against min resistance Other Exercises: weight bearing through  LUE/forearm when reaching cross body with RUE to gather clothes in L field   Shoulder Instructions       General Comments      Pertinent Vitals/ Pain       Pain Assessment: Faces Faces Pain Scale: No hurt  Home Living                                          Prior Functioning/Environment              Frequency  Min 3X/week        Progress Toward Goals  OT Goals(current goals can now be found in the care plan section)  Progress towards OT goals: Goals met and updated - see care plan  Acute Rehab OT Goals Patient Stated Goal: to walk more OT Goal Formulation: With patient Time For Goal Achievement: 11/13/20 Potential to Achieve Goals: Good ADL Goals Pt Will Perform Grooming: with supervision;standing Pt Will Perform Upper Body Dressing: with modified independence;sitting Pt Will Perform Lower Body Dressing: with modified independence;sit to/from stand;bed level Pt Will Transfer to Toilet: with modified independence;ambulating Pt Will Perform Toileting - Clothing Manipulation and hygiene: with modified independence;sitting/lateral leans Additional ADL Goal #1: Pt will direct level of care for bowel program 75% of time with min vc for safety awareness Additional ADL Goal #2: Pt will incorporate LUE into all mobility with min vc to increase functional use of LUE adn reduce risk of injury Additional ADL Goal #3:  (GOAL MET 5/17) Additional ADL  Goal #4:  (GOAL MET 5/17)  Plan Discharge plan remains appropriate;Frequency remains appropriate    Co-evaluation                 AM-PAC OT "6 Clicks" Daily Activity     Outcome Measure   Help from another person eating meals?: None Help from another person taking care of personal grooming?: A Little Help from another person toileting, which includes using toliet, bedpan, or urinal?: A Little Help from another person bathing (including washing, rinsing, drying)?: A Little Help from another person to  put on and taking off regular upper body clothing?: A Lot Help from another person to put on and taking off regular lower body clothing?: A Lot 6 Click Score: 17    End of Session Equipment Utilized During Treatment: Gait belt  OT Visit Diagnosis: Unsteadiness on feet (R26.81);Other abnormalities of gait and mobility (R26.89);Muscle weakness (generalized) (M62.81);Hemiplegia and hemiparesis;Other symptoms and signs involving cognitive function Hemiplegia - Right/Left: Left Hemiplegia - dominant/non-dominant: Non-Dominant Hemiplegia - caused by: Cerebral infarction   Activity Tolerance Patient tolerated treatment well   Patient Left Other (comment) (in wc)   Nurse Communication Mobility status        Time: 0141-0301 OT Time Calculation (min): 18 min  Charges: OT General Charges $OT Visit: 1 Visit OT Treatments $Self Care/Home Management : 38-52 mins $Neuromuscular Re-education: 8-22 mins  Maurie Boettcher, OT/L   Acute OT Clinical Specialist Acute Rehabilitation Services Pager [phone] Office [phone]    [facility] 10/30/2020, 2:53 PM

## 2020-10-30 NOTE — Plan of Care (Signed)
  Problem: Education: Goal: Knowledge of General Education information will improve Description: Including pain rating scale, medication(s)/side effects and non-pharmacologic comfort measures Outcome: Progressing   Problem: Health Behavior/Discharge Planning: Goal: Ability to manage health-related needs will improve Outcome: Progressing   Problem: Clinical Measurements: Goal: Ability to maintain clinical measurements within normal limits will improve Outcome: Progressing Goal: Will remain free from infection Outcome: Progressing Goal: Diagnostic test results will improve Outcome: Progressing Goal: Respiratory complications will improve Outcome: Progressing Goal: Cardiovascular complication will be avoided Outcome: Progressing   Problem: Activity: Goal: Risk for activity intolerance will decrease Outcome: Progressing   Problem: Nutrition: Goal: Adequate nutrition will be maintained Outcome: Progressing   Problem: Coping: Goal: Level of anxiety will decrease Outcome: Progressing   Problem: Elimination: Goal: Will not experience complications related to bowel motility Outcome: Progressing Goal: Will not experience complications related to urinary retention Outcome: Progressing   Problem: Pain Managment: Goal: General experience of comfort will improve Outcome: Progressing   Problem: Safety: Goal: Ability to remain free from injury will improve Outcome: Progressing   Problem: Skin Integrity: Goal: Risk for impaired skin integrity will decrease Outcome: Progressing   Problem: Education: Goal: Knowledge of disease or condition will improve Outcome: Progressing Goal: Knowledge of secondary prevention will improve Outcome: Progressing Goal: Knowledge of patient specific risk factors addressed and post discharge goals established will improve Outcome: Progressing Goal: Individualized Educational Video(s) Outcome: Progressing   Problem: Ischemic Stroke/TIA Tissue  Perfusion: Goal: Complications of ischemic stroke/TIA will be minimized Outcome: Progressing   Problem: Ischemic Stroke/TIA Tissue Perfusion: Goal: Complications of ischemic stroke/TIA will be minimized Outcome: Progressing

## 2020-10-30 NOTE — TOC Progression Note (Signed)
Transition of Care [facility]) - Progression Note    Patient Details  Name: Harry [name] MRN: [id number] Date of Birth: [birth year]  Transition of Care [facility]) CM/SW Contact  Melanny Wire [place], [place] Phone Number: 10/30/2020, 11:31 AM  Clinical Narrative:     CSW resubmitted referrals to [facility] facilities and requested they review pt for possible SNF admission with LOG  Expected Discharge Plan: Skilled Nursing Facility Barriers to Discharge: Continued Medical Work up,Inadequate or no insurance  Expected Discharge Plan and Services Expected Discharge Plan: Skilled Nursing Facility     Post Acute Care Choice: Skilled Nursing Facility Living arrangements for the past 2 months: Apartment                                       Social Determinants of Health (SDOH) Interventions    Readmission Risk Interventions No flowsheet data found.

## 2020-10-30 NOTE — Progress Notes (Addendum)
STROKE TEAM PROGRESS NOTE   SUBJECTIVE (INTERVAL HISTORY) Patient seen in room. Sitting in chair and appears comfortable.  Vital signs are stable.  Neurological exam improving slightly on the left.  Awaiting SNF placement  OBJECTIVE Temp:  [98.1 F (36.7 C)-98.6 F (37 C)] 98.2 F (36.8 C) (05/17 1644) Pulse Rate:  [72-83] 72 (05/17 1644) Cardiac Rhythm: Normal sinus rhythm (05/16 2000) Resp:  [14-20] 19 (05/17 1644) BP: (115-146)/(67-96) 132/84 (05/17 1644) SpO2:  [99 %-100 %] 100 % (05/17 1644)  No results for input(s): GLUCAP in the last 168 hours. Recent Labs  Lab 10/24/20 0453 10/26/20 2142 10/29/20 0453  NA 139  --  137  K 3.0* 3.1* 3.6  CL 102  --  103  CO2 29  --  27  GLUCOSE 92  --  93  BUN 12  --  8  CREATININE 0.64  --  0.62  CALCIUM 8.3*  --  8.3*  MG  --   --  1.5*   No results for input(s): AST, ALT, ALKPHOS, BILITOT, PROT, ALBUMIN in the last 168 hours. Recent Labs  Lab 10/24/20 0453 10/29/20 0453  WBC 4.7 5.9  HGB 8.8* 9.2*  HCT 26.6* 27.5*  MCV 86.1 85.9  PLT 386 341        Component Value Date/Time   CHOL 297 (H) 10/02/2020 0607   TRIG 62 10/02/2020 0607   HDL 73 10/02/2020 0607   CHOLHDL 4.1 10/02/2020 0607   VLDL 12 10/02/2020 0607   LDLCALC 212 (H) 10/02/2020 0607   Lab Results  Component Value Date   HGBA1C 5.3 10/02/2020      Component Value Date/Time   LABOPIA NONE DETECTED 10/05/2020 1321   COCAINSCRNUR NONE DETECTED 10/05/2020 1321   LABBENZ NONE DETECTED 10/05/2020 1321   AMPHETMU NONE DETECTED 10/05/2020 1321   THCU NONE DETECTED 10/05/2020 1321   LABBARB NONE DETECTED 10/05/2020 1321     MR Brain 10/02/20 1. Acute infarcts in the anterior and posterior right frontal lobe. Associated edema with sulcal effacement. Mild curvilinear susceptibility artifact the regions of infarct likely represent petechial hemorrhage. No mass occupying acute hemorrhage. 2. Focal susceptibility artifact within a right M2 MCA branch in  the region of the more posterior frontal lobe infarct, compatible with thrombus and known occlusion better characterized on recent CTA. 3. 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 4. Large right mastoid effusion  CTA Chest 4/26/202  1. No pulmonary vascular malformation or acute intrathoracic abnormality. 2. Mild cardiomegaly  CT head 1. Acute infarcts in the anterior and posterior right frontal lobe. Associated edema with sulcal effacement. Mild curvilinear susceptibility artifact the regions of infarct likely represent petechial hemorrhage. No mass occupying acute hemorrhage. 2. Focal susceptibility artifact within a right M2 MCA branch in the region of the more posterior frontal lobe infarct, compatible with thrombus and known occlusion better characterized on recent CTA. 3. 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 4. Large right mastoid effusion  PHYSICAL EXAM  Temp:  [98.1 F (36.7 C)-98.6 F (37 C)] 98.2 F (36.8 C) (05/17 1644) Pulse Rate:  [72-83] 72 (05/17 1644) Resp:  [14-20] 19 (05/17 1644) BP: (115-146)/(67-96) 132/84 (05/17 1644) SpO2:  [99 %-100 %] 100 % (05/17 1644)  General -frail middle-age African-American male not in distress Resp: No extra work of breathing Cardiovascular - Regular rhythm and rate.  Neuro - awake alert and oriented  to age, place, time and people. No aphasia, mild dysarthria but fluent language, following  all simple commands. Able to name and repeat.  no gaze palsy now, but still more right gaze preference, visual field full no hemianopia, PERRL.  Left facial droop. Tongue midline.  Right upper and lower extremity at least 4/5, left upper extremity deltoid and bicep 3-/5, tricep and finger movement 0/5, left lower extremity 3/5 proximal and distal PF/DF 1/5.  Sensation decreased on the left. Right finger-to-nose intact.  Gait not tested.    ASSESSMENT/PLAN Harry [name] is a 45 y.o. male with PMH  of heart murmur not on medications presented to ER for acute onset left arm and leg weakness, left visual and sensory neglect, right gaze preference, left facial droop nausea/vomiting. tPA given.  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  Discussed with patient regarding risk and benefit of thrombectomy, patient declined thrombectomy.     Stroke: Right MCA infarct, embolic pattern, source unclear but likely cardioembolic (see below)  CT head early ischemic changes right MCA but no hypodensity  CTA head neck was done showed right M2/M3 occlusion.   MRI right moderate MCA cortical infarcts   2D Echo EF 40 to 45%  LE venous Doppler no DVT  TEE EF 35-40%, no clot but LA appendage with SEC and reduced LAA emptying velocity. positive for PFO  TCD bubble study minmal (Spencer degree 2 ) PFO  LE venous Doppler negative for DVT  LDL 212  HgbA1c 5.3  UDS negative  Lovenox for VTE prophylaxis  No antithrombotic prior to admission, now on aspirin 81mg  daily and clopidogrel 75 mg daily DAPT for 3 months and then aspirin alone given LVO intracranially.  Patient counseled to be compliant with his antithrombotic medications  Ongoing aggressive stroke risk factor management Continue ongoing therapies.  No changes.  Patient is medically stable for transfer to skilled nursing facility when bed available.    Therapy recommendations: SNF   Disposition: SNF  Cardiomyopathy  EF 40 to 45%  TEE EF 35-40% with LAA SEC and reduced emptying velocity  Cardiology Dr. on board  CTA chest no pulmonary AVM  outpt follow up with Dr. [name]  ?? PFO  TEE positive for bilateral shunting suggestive of PFO  TCD bubble study minmal (Spencer degree 2 ) PFO  CTA chest no pulmonary AVM  Follow up with Dr. [name] as outpt  Hypertension . Better controlled . On amlodipine 10 mg daily  Long term BP goal normotensive  Hyperlipidemia  Home meds: None  LDL 212, goal <  70  Now on Lipitor 80  Continue statin at discharge  Dysphagia  Mild dysarthria  On dysphagia 2 and thin liquid  intermittent cough improving  Depressed mood  Dealing with new stroke deficits and reporting depressed mood  Less participation reported by therapy staff  Celexa initiated   Other Stroke Risk Factors  Hx of heart murmur  Other Active Problems   Hospital day # 29  Lissy Olivencia-Simmons, ACNP-BC Stroke NP  ATTENDING NOTE: I reviewed above note and agree with the assessment and plan. Pt was seen and examined.   Patient seen in the hallway working with PT.  Patient was able to walk near independently with hemiwalker, still has left-sided hemiparesis, but certainly improving.  However not able to go home at this time given lack of adequate support at home.  Still pending SNF.  For detailed assessment and plan, please refer to above as I have made changes wherever appropriate.   Harry Halim, MD PhD Stroke  Neurology 10/30/2020 6:46 PM      To contact Stroke Continuity provider, please refer to [email or web address]. After hours, contact General Neurology

## 2020-10-30 NOTE — Plan of Care (Signed)
  Problem: Education: Goal: Knowledge of General Education information will improve Description: Including pain rating scale, medication(s)/side effects and non-pharmacologic comfort measures Outcome: Progressing   Problem: Health Behavior/Discharge Planning: Goal: Ability to manage health-related needs will improve Outcome: Progressing   Problem: Clinical Measurements: Goal: Ability to maintain clinical measurements within normal limits will improve Outcome: Progressing Goal: Will remain free from infection Outcome: Progressing Goal: Diagnostic test results will improve Outcome: Progressing Goal: Respiratory complications will improve Outcome: Progressing Goal: Cardiovascular complication will be avoided Outcome: Progressing   Problem: Activity: Goal: Risk for activity intolerance will decrease Outcome: Progressing   Problem: Nutrition: Goal: Adequate nutrition will be maintained Outcome: Progressing   Problem: Coping: Goal: Level of anxiety will decrease Outcome: Progressing   Problem: Elimination: Goal: Will not experience complications related to bowel motility Outcome: Progressing Goal: Will not experience complications related to urinary retention Outcome: Progressing   Problem: Pain Managment: Goal: General experience of comfort will improve Outcome: Progressing   Problem: Safety: Goal: Ability to remain free from injury will improve Outcome: Progressing   Problem: Skin Integrity: Goal: Risk for impaired skin integrity will decrease Outcome: Progressing   Problem: Education: Goal: Knowledge of disease or condition will improve Outcome: Progressing Goal: Knowledge of secondary prevention will improve Outcome: Progressing Goal: Knowledge of patient specific risk factors addressed and post discharge goals established will improve Outcome: Progressing Goal: Individualized Educational Video(s) Outcome: Progressing   Problem: Ischemic Stroke/TIA Tissue  Perfusion: Goal: Complications of ischemic stroke/TIA will be minimized Outcome: Progressing   Problem: Ischemic Stroke/TIA Tissue Perfusion: Goal: Complications of ischemic stroke/TIA will be minimized Outcome: Progressing   

## 2020-10-30 NOTE — Progress Notes (Signed)
Physical Therapy Treatment Patient Details Name: Harry [name] MRN: [id number] DOB: [birth year] Today's Date: 10/30/2020    History of Present Illness 45 yo male presenting to ED with left sided weakness. CT showing early ischemic changes around right MCA but no hypodensity. tPA given on 4/18 @1823 . 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 Awaiting MRI. PMH including heart murmur not on medications.    PT Comments    Pt asleep in bed on entry, however reports that he did stay up for over an hour after OT session this morning. Pt continues to make incredible progress towards his goals with PT. Pt L AFO in place today which definitely is effective in decreased toe drag. Pt introduced to hemiwalker and is able to progress his mobility including turning which he was not able to do effectively with platform walker. By the end of 3rd bout of walking able to progress to light min A, however as he fatigues has 1x LoB requiring increased assist to steady. Pt is becoming more aware of his fatigue and self regulating when he appropriately needs to stop due to weakness. Pt also able to realize need for BM at end of session. Will continue to progress mobility and bowel and bladder control for hopeful d/c home    Follow Up Recommendations  SNF     Equipment Recommendations  3in1 (PT);Wheelchair cushion (measurements PT);Wheelchair (measurements PT)       Precautions / Restrictions Precautions Precautions: Fall;Other (comment) Precaution Comments: LUE hemiparesis, subluxing LUE Required Braces or Orthoses: Other Brace Other Brace: L cock up wrist splint, L AFO Restrictions Weight Bearing Restrictions: No    Mobility  Bed Mobility Overal bed mobility: Needs Assistance Bed Mobility: Supine to Sit   Sidelying to sit: Supervision Supine to sit: Supervision Sit to supine: Supervision   General bed mobility comments: supervision for safety    Transfers Overall transfer  level: Needs assistance Equipment used: Hemi-walker;1 person hand held assist;None Transfers: Sit to/from Sit to Stand: Min assist;Min guard   Squat pivot transfers: Min guard     General transfer comment: min A-min guard for multiple sit<>stand from wheelchair, min guard for squat pivot to and from [facility]  Ambulation/Gait Ambulation/Gait assistance: Min assist;+2 safety/equipment;Mod assist;+2 physical assistance (+2 for chair follow) Gait Distance (Feet): 40 Feet (x3) Assistive device: Hemi-walker Gait Pattern/deviations: Step-to pattern;Decreased step length - right;Decreased step length - left;Decreased weight shift to left Gait velocity: decr Gait velocity interpretation: <1.8 ft/sec, indicate of risk for recurrent falls General Gait Details: good progression to use of hemiwalker and progression from mod to light min A for support, multimodal cues at hip for pelvic rotation to advance L LE     [name] Wheelchair mobility: Yes Wheelchair propulsion: Right upper extremity;Right lower extremity Wheelchair Assistance Details (indicate cue type and reason): supervision for safety, cuing for brake management  Modified Rankin (Stroke Patients Only) Modified Rankin (Stroke Patients Only) Pre-Morbid Rankin Score: No symptoms Modified Rankin: Moderately severe disability     Balance Overall balance assessment: Needs assistance Sitting-balance support: Feet supported;Single extremity supported Sitting balance-Leahy Scale: Fair Sitting balance - Comments: note posterior lean when distracted during ADL tasks   Standing balance support: Bilateral upper extremity supported;Single extremity supported;No upper extremity supported Standing balance-Leahy Scale: Fair Standing balance comment: able to static stand with close minguard this session  Cognition Arousal/Alertness:  Awake/alert Behavior During Therapy: Flat affect Overall Cognitive Status: Impaired/Different from baseline Area of Impairment: Following commands;Safety/judgement;Awareness;Attention;Problem solving;Memory                   Current Attention Level: Selective Memory: Decreased recall of precautions Following Commands: Follows one step commands consistently Safety/Judgement: Decreased awareness of safety;Decreased awareness of deficits Awareness: Emergent Problem Solving: Slow processing General Comments: improved attention and awareness of L environment      Exercises Other Exercises Other Exercises: Scapular strengthening using PNF technique in R sidelying. Increased strength in elevation; scapular winging noted. Pt able to facilitate anterior and posterior depression however not as strong as elevation. Other Exercises: PNF technique for trunk strengthening in R sidelying -  elevating hip and depressing shoulder x 10 Other Exercises: In supine completed D1/D2 diagonals for L shoulder/UE strengthening x 10 Other Exercises: Scapular protraction x 10 in supine against min resistance Other Exercises: weight bearing through LUE/forearm when reaching cross body with RUE to gather clothes in L field    General Comments General comments (skin integrity, edema, etc.): Pt able to notice need for use of BSC after ambulation. Pt had BM and was able to perform self pericare with set up.      Pertinent Vitals/Pain Pain Assessment: Faces Faces Pain Scale: No hurt           PT Goals (current goals can now be found in the care plan section) Acute Rehab PT Goals Patient Stated Goal: to see Faith PT Goal Formulation: With patient/family Time For Goal Achievement: 10/16/20 Potential to Achieve Goals: Good Progress towards PT goals: Progressing toward goals    Frequency    Min 5X/week      PT Plan Current plan remains appropriate       AM-PAC PT "6 Clicks" Mobility    Outcome Measure  Help needed turning from your back to your side while in a flat bed without using bedrails?: A Little Help needed moving from lying on your back to sitting on the side of a flat bed without using bedrails?: A Little Help needed moving to and from a bed to a chair (including a wheelchair)?: A Little Help needed standing up from a chair using your arms (e.g., wheelchair or bedside chair)?: A Little Help needed to walk in hospital room?: A Little Help needed climbing 3-5 steps with a railing? : A Lot 6 Click Score: 17    End of Session Equipment Utilized During Treatment: Gait belt Activity Tolerance: Patient tolerated treatment well Patient left: with call bell/phone within reach;in chair;with chair alarm set Nurse Communication: Mobility status PT Visit Diagnosis: Unsteadiness on feet (R26.81);Other abnormalities of gait and mobility (R26.89);Difficulty in walking, not elsewhere classified (R26.2);Hemiplegia and hemiparesis Hemiplegia - Right/Left: Left Hemiplegia - dominant/non-dominant: Non-dominant Hemiplegia - caused by: Cerebral infarction     Time: 9518-8416 PT Time Calculation (min) (ACUTE ONLY): 54 min  Charges:  $Gait Training: 23-37 mins $Therapeutic Activity: 8-22 mins                     Vivika Poythress B. Beverely Risen PT, DPT Acute Rehabilitation Services Pager [phone] Office [phone]    Elon Alas Fleet 10/30/2020, 5:08 PM

## 2020-10-31 LAB — CBC
HCT: 26.3 % — ABNORMAL LOW (ref 39.0–52.0)
Hemoglobin: 8.7 g/dL — ABNORMAL LOW (ref 13.0–17.0)
MCH: 28.5 pg (ref 26.0–34.0)
MCHC: 33.1 g/dL (ref 30.0–36.0)
MCV: 86.2 fL (ref 80.0–100.0)
Platelets: 310 10*3/uL (ref 150–400)
RBC: 3.05 MIL/uL — ABNORMAL LOW (ref 4.22–5.81)
RDW: 13.5 % (ref 11.5–15.5)
WBC: 4.7 10*3/uL (ref 4.0–10.5)
nRBC: 0 % (ref 0.0–0.2)

## 2020-10-31 LAB — BASIC METABOLIC PANEL
Anion gap: 7 (ref 5–15)
BUN: 14 mg/dL (ref 6–20)
CO2: 27 mmol/L (ref 22–32)
Calcium: 8 mg/dL — ABNORMAL LOW (ref 8.9–10.3)
Chloride: 105 mmol/L (ref 98–111)
Creatinine, Ser: 0.65 mg/dL (ref 0.61–1.24)
GFR, Estimated: >60 mL/min (ref >60)
Glucose, Bld: 91 mg/dL (ref 70–99)
Potassium: 3.3 mmol/L — ABNORMAL LOW (ref 3.5–5.1)
Sodium: 139 mmol/L (ref 135–145)

## 2020-10-31 LAB — MAGNESIUM: Magnesium: 1.7 mg/dL (ref 1.7–2.4)

## 2020-10-31 MED ORDER — MAGNESIUM SULFATE 2 GM/50ML IV SOLN
2.0000 g | Freq: Once | INTRAVENOUS | Status: AC
  Administered 2020-10-31: 2 g via INTRAVENOUS
  Filled 2020-10-31: qty 50

## 2020-10-31 MED ORDER — POTASSIUM CHLORIDE CRYS ER 20 MEQ PO TBCR
40.0000 meq | EXTENDED_RELEASE_TABLET | Freq: Once | ORAL | Status: AC
  Administered 2020-10-31: 40 meq via ORAL
  Filled 2020-10-31: qty 2

## 2020-10-31 NOTE — TOC Progression Note (Addendum)
Transition of Care [facility]) - Progression Note    Patient Details  Name: Harry [name] MRN: [id number] Date of Birth: [birth year]  Transition of Care [facility]) CM/SW Contact  Erin Sons, [place] Phone Number: 10/31/2020, 2:52 PM  Clinical Narrative:     [facility] does not accept LOGs [facility] are still reviewing [facility] cannot accept LOG unless pt already has disability income which he does not  1650: [facility] considering pt; had questions about pt's post-snf discharge plans. CSW updated [facility] liaison that pt plans to return to live with his brother. CSW also clarified that pt's medicaid application was started according to [facility] notes. If vaccine information is needed, pt is not covid vaccinated. CSW will continue to coordinate with [facility] as they review pt.  Expected Discharge Plan: Skilled Nursing Facility Barriers to Discharge: Continued Medical Work up,Inadequate or no insurance  Expected Discharge Plan and Services Expected Discharge Plan: Skilled Nursing Facility     Post Acute Care Choice: Skilled Nursing Facility Living arrangements for the past 2 months: Apartment                                       Social Determinants of Health (SDOH) Interventions    Readmission Risk Interventions No flowsheet data found.

## 2020-10-31 NOTE — Progress Notes (Signed)
OT Cancellation Note  Patient Details Name: Harry [name] MRN: [id number] DOB: Mar 10, 1976   Cancelled Treatment:    Reason Eval/Treat Not Completed: Patient at procedure or test/ unavailable;Other (comment) pt working with SLP, will check back as time allows for OT session.  Pollyann Glen K., COTA/L Acute Rehabilitation Services [phone]   Barron Schmid 10/31/2020, 4:15 PM

## 2020-10-31 NOTE — Plan of Care (Signed)
  Problem: Education: Goal: Knowledge of General Education information will improve Description: Including pain rating scale, medication(s)/side effects and non-pharmacologic comfort measures Outcome: Progressing   Problem: Health Behavior/Discharge Planning: Goal: Ability to manage health-related needs will improve Outcome: Progressing   Problem: Clinical Measurements: Goal: Ability to maintain clinical measurements within normal limits will improve Outcome: Progressing Goal: Will remain free from infection Outcome: Progressing Goal: Diagnostic test results will improve Outcome: Progressing Goal: Respiratory complications will improve Outcome: Progressing Goal: Cardiovascular complication will be avoided Outcome: Progressing   Problem: Activity: Goal: Risk for activity intolerance will decrease Outcome: Progressing   Problem: Nutrition: Goal: Adequate nutrition will be maintained Outcome: Progressing   Problem: Coping: Goal: Level of anxiety will decrease Outcome: Progressing   Problem: Elimination: Goal: Will not experience complications related to bowel motility Outcome: Progressing Goal: Will not experience complications related to urinary retention Outcome: Progressing   Problem: Pain Managment: Goal: General experience of comfort will improve Outcome: Progressing   Problem: Safety: Goal: Ability to remain free from injury will improve Outcome: Progressing   Problem: Skin Integrity: Goal: Risk for impaired skin integrity will decrease Outcome: Progressing   Problem: Education: Goal: Knowledge of disease or condition will improve Outcome: Progressing Goal: Knowledge of secondary prevention will improve Outcome: Progressing Goal: Knowledge of patient specific risk factors addressed and post discharge goals established will improve Outcome: Progressing Goal: Individualized Educational Video(s) Outcome: Progressing   Problem: Ischemic Stroke/TIA Tissue  Perfusion: Goal: Complications of ischemic stroke/TIA will be minimized Outcome: Progressing   Problem: Ischemic Stroke/TIA Tissue Perfusion: Goal: Complications of ischemic stroke/TIA will be minimized Outcome: Progressing   

## 2020-10-31 NOTE — Progress Notes (Signed)
  Speech Language Pathology Treatment: Dysphagia  Patient Details Name: Harry [name] MRN: [id number] DOB: 09-26-1975 Today's Date: 10/31/2020 Time: 0263-7858 SLP Time Calculation (min) (ACUTE ONLY): 26.82 min  Assessment / Plan / Recommendation Clinical Impression  Pt was seen for dysphagia treatment. He was alert and cooperative during the session and he denied any difficulty with the current diet. Pt expressed interest in having his diet advanced since he has been restricted from pickles on his burgers and from salads which he enjoys. Pt tolerated regular texture solids and consecutive swallows of thin liquids without overt s/sx of aspiration. Pt consumed thin liquids immediately after intake of regular texture solids and no significant oral residue was noted thereafter. Pt's diet will be advanced to regular texture solids and thin liquids. Pt demonstrated 60% accuracy with a mental manipulation sequencing task increasing to 100% with verbal prompts. He achieved 100% accuracy with problem solving related to safety and with recall of concrete information from recorded voice mails. He demonstrated 50% accuracy with recall of inferred information from voice mails increasing to 75% with verbal prompts. He achieved 25% accuracy with reasoning increasing to 50% with verbal prompts. SLP will continue to follow pt.    HPI HPI: Harry [name] is a 45 y.o. African American male with PMH of heart murmur not on medications presented to ER for acute onset left arm and leg weakness, left neglect, right gaze preference, nausea/vomiting.  Patient fully awake alert orientated, per patient he stated that he was reading from his cell phone around 5:25 PM, he had acute onset not feeling well, nausea and vomiting.  However, he denies any arm or leg weakness numbness.  His brother is in the same house but given room with him, brother saw him last at 57 AM at baseline, but then saw him after he had nausea vomiting and  noticed that he was not able to move on the left side with left facial droop.      SLP Plan  Continue with current plan of care       Recommendations  Diet recommendations: Regular;Thin liquid Liquids provided via: Cup;Straw Medication Administration: Whole meds with liquid Supervision: Patient able to self feed;Intermittent supervision to cue for compensatory strategies Compensations: Slow rate;Small sips/bites;Minimize environmental distractions;Lingual sweep for clearance of pocketing Postural Changes and/or Swallow Maneuvers: Seated upright 90 degrees                Oral Care Recommendations: Oral care BID Follow up Recommendations: Inpatient Rehab SLP Visit Diagnosis: Dysphagia, oropharyngeal phase (R13.12);Cognitive communication deficit (R41.841) Plan: Continue with current plan of care       Kanon Novosel I. Vear Clock, MS, CCC-SLP Acute Rehabilitation Services Office number [phone] Pager [phone]                Scheryl Marten 10/31/2020, 4:30 PM

## 2020-10-31 NOTE — Progress Notes (Addendum)
Occupational Therapy Treatment Patient Details Name: Harry [name] MRN: [id number] DOB: 02-12-1976 Today's Date: 10/31/2020    History of present illness 45 yo male presenting to ED with left sided weakness. CT showing early ischemic changes around right MCA but no hypodensity. tPA given on 4/18 @1823 . 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 Awaiting MRI. PMH including heart murmur not on medications.   OT comments  Pt received sitting in recliner finishing up with SLP session. Pt requested to return to bed, MIN A to pivot to EOB with hand held assist. Pt complete L scapular therex in side lying as indicated below with no reports of increased pain. Pt with biggest improvements in scapular protraction<>retraction and depression but continues to be weaker with scapular elevation. Noted pt able to reach to over head rail with LUE and apply gross grasp to railing. Will continue efforts to get pt home with brother.  Follow Up Recommendations  SNF    Equipment Recommendations  3 in 1 bedside commode;Wheelchair (measurements OT);Wheelchair cushion (measurements OT)    Recommendations for Other Services      Precautions / Restrictions Precautions Precautions: Fall;Other (comment) Precaution Comments: LUE hemiparesis, subluxing LUE Required Braces or Orthoses: Other Brace Other Brace: L cock up wrist splint, L AFO Restrictions Weight Bearing Restrictions: No       Mobility Bed Mobility Overal bed mobility: Needs Assistance Bed Mobility: Sit to Supine     Supine to sit: Supervision Sit to supine: Supervision   General bed mobility comments: supervision for safety    Transfers Overall transfer level: Needs assistance Equipment used: 1 person hand held assist Transfers: Sit to/from Sit to Stand: Min guard Stand pivot transfers: Min assist       General transfer comment: min guard to rise from recliner, MIN A for hand held  steadying assist to pivot back to EOB    Balance Overall balance assessment: Needs assistance Sitting-balance support: Feet supported;Single extremity supported Sitting balance-Leahy Scale: Fair     Standing balance support: Single extremity supported;During functional activity Standing balance-Leahy Scale: Fair Standing balance comment: able to static stand with close minguard this session                           ADL either performed or assessed with clinical judgement   ADL                           Toilet Transfer: Minimal assistance;Stand-pivot Toilet Transfer Details (indicate cue type and reason): simulated via functional mobility from recliner>EOB, hand held MIN A for balance         Functional mobility during ADLs: Minimal assistance General ADL Comments: session focus on LUE therex in sidelying     Vision       Perception     Praxis      Cognition Arousal/Alertness: Awake/alert Behavior During Therapy: Flat affect Overall Cognitive Status: Impaired/Different from baseline Area of Impairment: Following commands;Safety/judgement;Awareness;Attention;Problem solving;Memory                   Current Attention Level: Selective Memory: Decreased recall of precautions Following Commands: Follows one step commands consistently Safety/Judgement: Decreased awareness of safety;Decreased awareness of deficits Awareness: Emergent Problem Solving: Slow processing General Comments: pt with a little perseveration today on fact that he needs to be completely independent before going home with his brother  Exercises Other Exercises Other Exercises: scapular strength using PNF pattern in R sidelying, elevation<>depression, protraction<>retraction Other Exercises: PNF technique for trunk strengthening in R sidelying -  elevating hip and depressing shoulder x 10   Shoulder Instructions       General Comments doffed K tape- plan to  reassess next session    Pertinent Vitals/ Pain       Pain Assessment: No/denies pain Faces Pain Scale: No hurt  Home Living                                          Prior Functioning/Environment              Frequency  Min 3X/week        Progress Toward Goals  OT Goals(current goals can now be found in the care plan section)  Progress towards OT goals: Progressing toward goals  Acute Rehab OT Goals Patient Stated Goal: to order food OT Goal Formulation: With patient Time For Goal Achievement: 11/13/20 Potential to Achieve Goals: Good  Plan Discharge plan remains appropriate;Frequency remains appropriate    Co-evaluation                 AM-PAC OT "6 Clicks" Daily Activity     Outcome Measure   Help from another person eating meals?: None Help from another person taking care of personal grooming?: A Little Help from another person toileting, which includes using toliet, bedpan, or urinal?: A Little Help from another person bathing (including washing, rinsing, drying)?: A Little Help from another person to put on and taking off regular upper body clothing?: A Lot Help from another person to put on and taking off regular lower body clothing?: A Lot 6 Click Score: 17    End of Session Equipment Utilized During Treatment: Gait belt  OT Visit Diagnosis: Unsteadiness on feet (R26.81);Other abnormalities of gait and mobility (R26.89);Muscle weakness (generalized) (M62.81);Hemiplegia and hemiparesis;Other symptoms and signs involving cognitive function Hemiplegia - Right/Left: Left Hemiplegia - dominant/non-dominant: Non-Dominant Hemiplegia - caused by: Cerebral infarction   Activity Tolerance Patient tolerated treatment well   Patient Left in bed;with call bell/phone within reach;with bed alarm set   Nurse Communication Mobility status        Time: 2863-8177 OT Time Calculation (min): 13 min  Charges: OT General Charges $OT Visit:  1 Visit OT Treatments $Therapeutic Exercise: 8-22 mins  Lenor Derrick., COTA/L Acute Rehabilitation Services [phone]    Barron Schmid 10/31/2020, 4:38 PM

## 2020-10-31 NOTE — Progress Notes (Addendum)
STROKE TEAM PROGRESS NOTE   SUBJECTIVE (INTERVAL HISTORY) Patient seen in room. Sitting in bed eating lunch.  Appears comfortable.  Vital signs are stable.  Neurological exam unchanged.  Awaiting SNF placement.  OBJECTIVE Temp:  [98.1 F (36.7 C)-98.8 F (37.1 C)] 98.8 F (37.1 C) (05/18 1216) Pulse Rate:  [71-81] 81 (05/18 1216) Resp:  [15-19] 17 (05/18 1216) BP: (122-150)/(78-98) 138/89 (05/18 1216) SpO2:  [99 %-100 %] 99 % (05/18 1216)  No results for input(s): GLUCAP in the last 168 hours. Recent Labs  Lab 10/26/20 2142 10/29/20 0453 10/31/20 0357  NA  --  137 139  K 3.1* 3.6 3.3*  CL  --  103 105  CO2  --  27 27  GLUCOSE  --  93 91  BUN  --  8 14  CREATININE  --  0.62 0.65  CALCIUM  --  8.3* 8.0*  MG  --  1.5* 1.7   No results for input(s): AST, ALT, ALKPHOS, BILITOT, PROT, ALBUMIN in the last 168 hours. Recent Labs  Lab 10/29/20 0453 10/31/20 0357  WBC 5.9 4.7  HGB 9.2* 8.7*  HCT 27.5* 26.3*  MCV 85.9 86.2  PLT 341 310        Component Value Date/Time   CHOL 297 (H) 10/02/2020 0607   TRIG 62 10/02/2020 0607   HDL 73 10/02/2020 0607   CHOLHDL 4.1 10/02/2020 0607   VLDL 12 10/02/2020 0607   LDLCALC 212 (H) 10/02/2020 0607   Lab Results  Component Value Date   HGBA1C 5.3 10/02/2020      Component Value Date/Time   LABOPIA NONE DETECTED 10/05/2020 1321   COCAINSCRNUR NONE DETECTED 10/05/2020 1321   LABBENZ NONE DETECTED 10/05/2020 1321   AMPHETMU NONE DETECTED 10/05/2020 1321   THCU NONE DETECTED 10/05/2020 1321   LABBARB NONE DETECTED 10/05/2020 1321     MR Brain 10/02/20 1. Acute infarcts in the anterior and posterior right frontal lobe. Associated edema with sulcal effacement. Mild curvilinear susceptibility artifact the regions of infarct likely represent petechial hemorrhage. No mass occupying acute hemorrhage. 2. Focal susceptibility artifact within a right M2 MCA branch in the region of the more posterior frontal lobe infarct, compatible  with thrombus and known occlusion better characterized on recent CTA. 3. 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 4. Large right mastoid effusion  CTA Chest 4/26/202  1. No pulmonary vascular malformation or acute intrathoracic abnormality. 2. Mild cardiomegaly  CT head 1. Acute infarcts in the anterior and posterior right frontal lobe. Associated edema with sulcal effacement. Mild curvilinear susceptibility artifact the regions of infarct likely represent petechial hemorrhage. No mass occupying acute hemorrhage. 2. Focal susceptibility artifact within a right M2 MCA branch in the region of the more posterior frontal lobe infarct, compatible with thrombus and known occlusion better characterized on recent CTA. 3. 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 4. Large right mastoid effusion  PHYSICAL EXAM  Temp:  [98.1 F (36.7 C)-98.8 F (37.1 C)] 98.8 F (37.1 C) (05/18 1216) Pulse Rate:  [71-81] 81 (05/18 1216) Resp:  [15-19] 17 (05/18 1216) BP: (122-150)/(78-98) 138/89 (05/18 1216) SpO2:  [99 %-100 %] 99 % (05/18 1216)  General -frail middle-age African-American male not in distress Resp: No extra work of breathing Cardiovascular - Regular rhythm and rate.  Neuro - awake alert and oriented  to age, place, time and people. No aphasia, mild dysarthria but fluent language, following all simple commands. Able to name and repeat.  no gaze  palsy now, but still more right gaze preference, visual field full no hemianopia, PERRL.  Left facial droop. Tongue midline.  Right upper and lower extremity at least 4/5, left upper extremity deltoid and bicep 3-/5, tricep and finger movement 0/5, left lower extremity 3/5 proximal and distal PF/DF 1/5.  Sensation decreased on the left. Right finger-to-nose intact.  Gait not tested.    ASSESSMENT/PLAN [name] is a 45 y.o. male with PMH of heart murmur not on medications presented to ER for acute  onset left arm and leg weakness, left visual and sensory neglect, right gaze preference, left facial droop nausea/vomiting. tPA given.  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  Discussed with patient regarding risk and benefit of thrombectomy, patient declined thrombectomy.     Stroke: Right MCA infarct, embolic pattern, source unclear but likely cardioembolic (see below)  CT head early ischemic changes right MCA but no hypodensity  CTA head neck was done showed right M2/M3 occlusion.   MRI right moderate MCA cortical infarcts   2D Echo EF 40 to 45%  LE venous Doppler no DVT  TEE EF 35-40%, no clot but LA appendage with SEC and reduced LAA emptying velocity. positive for PFO  TCD bubble study minimal (Spencer degree 2 ) PFO  LE venous Doppler negative for DVT  LDL 212  HgbA1c 5.3  UDS negative  Lovenox for VTE prophylaxis  No antithrombotic prior to admission, now on aspirin 81mg  daily and clopidogrel 75 mg daily DAPT for 3 months and then aspirin alone given LVO intracranially.  Patient counseled to be compliant with his antithrombotic medications  Ongoing aggressive stroke risk factor management Continue ongoing therapies.  No changes.  Patient is medically stable for transfer to skilled nursing facility when bed available.    Therapy recommendations: SNF   Disposition: SNF  Cardiomyopathy  EF 40 to 45%  TEE EF 35-40% with LAA SEC and reduced emptying velocity  Cardiology Dr. on board  CTA chest no pulmonary AVM  outpt follow up with Dr. Jacinto Halim  ?? PFO  TEE positive for bilateral shunting suggestive of PFO  TCD bubble study minmal (Spencer degree 2 ) PFO  CTA chest no pulmonary AVM  Follow up with Dr. Jacinto Halim as outpt  Hypertension . Better controlled . On amlodipine 10 mg daily  Long term BP goal normotensive  Hyperlipidemia  Home meds: None  LDL 212, goal < 70  Now on Lipitor 80  Continue statin at  discharge  Dysphagia  Mild dysarthria  On dysphagia 2 and thin liquid  intermittent cough improving  Depressed mood  Dealing with new stroke deficits and reporting depressed mood  Less participation reported by therapy staff  Celexa initiated   Other Stroke Risk Factors  Hx of heart murmur  Other Active Problems   hypokalemia: k+ 3.3 replete today  Hospital day # 30  Lissy Olivencia-Simmons, ACNP-BC Stroke NP  ATTENDING NOTE: I reviewed above note and agree with the assessment and plan. Pt was seen and examined.   No acute event overnight.  Neuro stable.  Still pending SNF placement.  Potassium 3.3 and magnesium 1.7.  Status post supplement.  Recheck on Friday.  Encourage to work hard with PT/OT.  For detailed assessment and plan, please refer to above as I have made changes wherever appropriate.   Wednesday, MD PhD Stroke Neurology 10/31/2020 5:42 PM        To contact Stroke Continuity provider, please refer  to [email or web address] After hours, contact General Neurology

## 2020-10-31 NOTE — Progress Notes (Signed)
Physical Therapy Treatment Patient Details Name: Harry [name] MRN: [id number] DOB: 25-Feb-1976 Today's Date: 10/31/2020    History of Present Illness 45 yo male presenting to ED with left sided weakness. CT showing early ischemic changes around right MCA but no hypodensity. tPA given on 4/18 @1823 . 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 Awaiting MRI. PMH including heart murmur not on medications.    PT Comments    Pt in bed on entry, reports he should use BSC before working with therapy. With set up pt min guard for bed mobility and transfer to [facility], and independent for self pericare. Pt reports that his brother was there today and that he can not go home until he is completely independent. Almost perseverative about need for independence. PT remarked on his constant steady progress and how he might not be walking totally independently but that he could probably go home soon. Pt able to ambulate from room to gym 100 feet away with min A and 1 seated rest break. Once in gym worked on [name]. Pt with increased fatigue able to propel himself back to room with supervision. Per CM/SW note possibility for SNF placement.     Follow Up Recommendations  SNF     Equipment Recommendations  3in1 (PT);Wheelchair cushion (measurements PT);Wheelchair (measurements PT)       Precautions / Restrictions Precautions Precautions: Fall;Other (comment) Precaution Comments: LUE hemiparesis, subluxing LUE Required Braces or Orthoses: Other Brace Other Brace: L cock up wrist splint, L AFO Restrictions Weight Bearing Restrictions: No    Mobility  Bed Mobility Overal bed mobility: Needs Assistance Bed Mobility: Supine to Sit     Supine to sit: Supervision     General bed mobility comments: supervision for safety    Transfers Overall transfer level: Needs assistance Equipment used: Hemi-walker;1 person hand held assist;None Transfers: Sit to/from Editor, commissioning Sit to Stand: Min assist;Min guard Stand pivot transfers: Min guard       General transfer comment: min guard for stand pivot transfer from bed to BSC, and minA-min guard for numerous sit>stand from w/c during session  Ambulation/Gait Ambulation/Gait assistance: Min assist;+2 safety/equipment;Mod assist;+2 physical assistance (+2 for chair follow) Gait Distance (Feet): 50 Feet (2x50') Assistive device: Hemi-walker Gait Pattern/deviations: Step-to pattern;Decreased step length - right;Decreased step length - left;Decreased weight shift to left Gait velocity: decr Gait velocity interpretation: <1.8 ft/sec, indicate of risk for recurrent falls General Gait Details: able to ambulate with hemiwalker and light min A with 1 x LoB due to fatigue and decreased L foot clearance in swing through         [employer] Wheelchair mobility: Yes Wheelchair propulsion: Right upper extremity;Right lower extremity Distance: 100 Wheelchair Assistance Details (indicate cue type and reason): supervision for safety, cuing for brake management  Modified Rankin (Stroke Patients Only) Modified Rankin (Stroke Patients Only) Pre-Morbid Rankin Score: No symptoms Modified Rankin: Moderately severe disability     Balance Overall balance assessment: Needs assistance Sitting-balance support: Feet supported;Single extremity supported Sitting balance-Leahy Scale: Fair     Standing balance support: Bilateral upper extremity supported;Single extremity supported;No upper extremity supported Standing balance-Leahy Scale: Fair Standing balance comment: able to static stand with close minguard this session                            Cognition Arousal/Alertness: Awake/alert Behavior During Therapy: Flat affect Overall Cognitive Status: Impaired/Different from baseline Area  of Impairment: Following commands;Safety/judgement;Awareness;Attention;Problem  solving;Memory                   Current Attention Level: Selective Memory: Decreased recall of precautions Following Commands: Follows one step commands consistently Safety/Judgement: Decreased awareness of safety;Decreased awareness of deficits Awareness: Emergent Problem Solving: Slow processing General Comments: pt with a little perseveration today on fact that he needs to be completely independent before going home with his brother      Exercises Other Exercises Other Exercises: stepping to 6 inch step Lx10, Rx10 with seated rest break able to repeat and finish with 5x alternating from L to R    General Comments General comments (skin integrity, edema, etc.): Pt is independent for pericare with set up      Pertinent Vitals/Pain Pain Assessment: No/denies pain Faces Pain Scale: No hurt           PT Goals (current goals can now be found in the care plan section) Acute Rehab PT Goals Patient Stated Goal: to see Harry [name] PT Goal Formulation: With patient/family Time For Goal Achievement: 10/16/20 Potential to Achieve Goals: Good Progress towards PT goals: Progressing toward goals    Frequency    Min 5X/week      PT Plan Current plan remains appropriate       AM-PAC PT "6 Clicks" Mobility   Outcome Measure  Help needed turning from your back to your side while in a flat bed without using bedrails?: A Little Help needed moving from lying on your back to sitting on the side of a flat bed without using bedrails?: A Little Help needed moving to and from a bed to a chair (including a wheelchair)?: A Little Help needed standing up from a chair using your arms (e.g., wheelchair or bedside chair)?: A Little Help needed to walk in hospital room?: A Little Help needed climbing 3-5 steps with a railing? : A Lot 6 Click Score: 17    End of Session Equipment Utilized During Treatment: Gait belt Activity Tolerance: Patient tolerated treatment well Patient left: with  call bell/phone within reach;in chair;with chair alarm set Nurse Communication: Mobility status PT Visit Diagnosis: Unsteadiness on feet (R26.81);Other abnormalities of gait and mobility (R26.89);Difficulty in walking, not elsewhere classified (R26.2);Hemiplegia and hemiparesis Hemiplegia - Right/Left: Left Hemiplegia - dominant/non-dominant: Non-dominant Hemiplegia - caused by: Cerebral infarction     Time: 9629-5284 PT Time Calculation (min) (ACUTE ONLY): 47 min  Charges:  $Gait Training: 8-22 mins $Therapeutic Exercise: 8-22 mins $Therapeutic Activity: 8-22 mins                     Wladyslawa Disbro B. Beverely Risen PT, DPT Acute Rehabilitation Services Pager [phone] Office [phone]    Elon Alas [facility] 10/31/2020, 4:17 PM

## 2020-11-01 NOTE — Progress Notes (Addendum)
STROKE TEAM PROGRESS NOTE   SUBJECTIVE (INTERVAL HISTORY) No acute events. Neurologically stable. VSS.  Resting in bed in NAD. Awaiting SNF placement for about 3 weeks in difficult to place scenario.   OBJECTIVE Temp:  [98 F (36.7 C)-98.8 F (37.1 C)] 98.6 F (37 C) (05/19 0746) Pulse Rate:  [72-81] 72 (05/19 0746) Resp:  [16-18] 18 (05/19 0425) BP: (101-151)/(77-98) 151/95 (05/19 0746) SpO2:  [97 %-100 %] 99 % (05/19 0746)  No results for input(s): GLUCAP in the last 168 hours. Recent Labs  Lab 10/26/20 2142 10/29/20 0453 10/31/20 0357  NA  --  137 139  K 3.1* 3.6 3.3*  CL  --  103 105  CO2  --  27 27  GLUCOSE  --  93 91  BUN  --  8 14  CREATININE  --  0.62 0.65  CALCIUM  --  8.3* 8.0*  MG  --  1.5* 1.7   No results for input(s): AST, ALT, ALKPHOS, BILITOT, PROT, ALBUMIN in the last 168 hours. Recent Labs  Lab 10/29/20 0453 10/31/20 0357  WBC 5.9 4.7  HGB 9.2* 8.7*  HCT 27.5* 26.3*  MCV 85.9 86.2  PLT 341 310        Component Value Date/Time   CHOL 297 (H) 10/02/2020 0607   TRIG 62 10/02/2020 0607   HDL 73 10/02/2020 0607   CHOLHDL 4.1 10/02/2020 0607   VLDL 12 10/02/2020 0607   LDLCALC 212 (H) 10/02/2020 0607   Lab Results  Component Value Date   HGBA1C 5.3 10/02/2020      Component Value Date/Time   LABOPIA NONE DETECTED 10/05/2020 1321   COCAINSCRNUR NONE DETECTED 10/05/2020 1321   LABBENZ NONE DETECTED 10/05/2020 1321   AMPHETMU NONE DETECTED 10/05/2020 1321   THCU NONE DETECTED 10/05/2020 1321   LABBARB NONE DETECTED 10/05/2020 1321     MR Brain 10/02/20 1. Acute infarcts in the anterior and posterior right frontal lobe. Associated edema with sulcal effacement. Mild curvilinear susceptibility artifact the regions of infarct likely represent petechial hemorrhage. No mass occupying acute hemorrhage. 2. Focal susceptibility artifact within a right M2 MCA branch in the region of the more posterior frontal lobe infarct, compatible  with thrombus and known occlusion better characterized on recent CTA. 3. 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 4. Large right mastoid effusion  CTA Chest 4/26/202  1. No pulmonary vascular malformation or acute intrathoracic abnormality. 2. Mild cardiomegaly  CT head 1. Acute infarcts in the anterior and posterior right frontal lobe. Associated edema with sulcal effacement. Mild curvilinear susceptibility artifact the regions of infarct likely represent petechial hemorrhage. No mass occupying acute hemorrhage. 2. Focal susceptibility artifact within a right M2 MCA branch in the region of the more posterior frontal lobe infarct, compatible with thrombus and known occlusion better characterized on recent CTA. 3. 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 4. Large right mastoid effusion  PHYSICAL EXAM  Temp:  [98 F (36.7 C)-98.8 F (37.1 C)] 98.6 F (37 C) (05/19 0746) Pulse Rate:  [72-81] 72 (05/19 0746) Resp:  [16-18] 18 (05/19 0425) BP: (101-151)/(77-98) 151/95 (05/19 0746) SpO2:  [97 %-100 %] 99 % (05/19 0746)  General -frail middle-age African-American male not in distress Resp: No extra work of breathing Cardiovascular - Regular rhythm and rate.  Neuro - awake alert and oriented  to age, place, time and people. No aphasia, mild dysarthria but fluent language, following all simple commands. Able to name and repeat.  no gaze  palsy now, but still more right gaze preference, visual field full no hemianopia, PERRL.  Left facial droop. Tongue midline.  Right upper and lower extremity at least 4/5, left upper extremity deltoid and bicep 3-/5, tricep and finger movement 0/5, left lower extremity 3/5 proximal and distal PF/DF 1/5.  Sensation decreased on the left. Right finger-to-nose intact.  Gait not tested.    ASSESSMENT/PLAN [name] is a 45 y.o. male with PMH of heart murmur not on medications presented to ER for acute onset  left arm and leg weakness, left visual and sensory neglect, right gaze preference, left facial droop nausea/vomiting. tPA given.  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  Discussed with patient regarding risk and benefit of thrombectomy, patient declined thrombectomy.     Stroke: Right MCA infarct, embolic pattern, source unclear but likely cardioembolic (see below)  CT head early ischemic changes right MCA but no hypodensity  CTA head neck was done showed right M2/M3 occlusion.   MRI right moderate MCA cortical infarcts   2D Echo EF 40 to 45%  LE venous Doppler no DVT  TEE EF 35-40%, no clot but LA appendage with SEC and reduced LAA emptying velocity. positive for PFO  TCD bubble study minimal (Spencer degree 2 ) PFO  LE venous Doppler negative for DVT  LDL 212  HgbA1c 5.3  UDS negative  Lovenox for VTE prophylaxis  No antithrombotic prior to admission, now on aspirin 81mg  daily and clopidogrel 75 mg daily DAPT for 3 months and then aspirin alone given LVO intracranially.  Patient counseled to be compliant with his antithrombotic medications  Ongoing aggressive stroke risk factor management Continue ongoing therapies.  No changes.  Patient is medically stable for transfer to skilled nursing facility when bed available.    Therapy recommendations: SNF, appreciate the work of the therapy team who has coordinated rehab like experience for patient as he awaits placement  Disposition: SNF  Cardiomyopathy  EF 40 to 45%  TEE EF 35-40% with LAA SEC and reduced emptying velocity  Cardiology Dr. on board  CTA chest no pulmonary AVM  outpt follow up with Dr. Jacinto Halim  ?? PFO  TEE positive for bilateral shunting suggestive of PFO  TCD bubble study minmal (Spencer degree 2 ) PFO  CTA chest no pulmonary AVM  Follow up with Dr. Jacinto Halim as outpt  Hypertension . Better controlled . On amlodipine 10 mg daily  Long term BP goal  normotensive  Hyperlipidemia  Home meds: None  LDL 212, goal < 70  Now on Lipitor 80  Continue statin at discharge  Dysphagia  Mild dysarthria  On dysphagia 2 and thin liquid  intermittent cough improving  Depressed mood  Dealing with new stroke deficits and reporting depressed mood  Less participation reported by therapy staff  Celexa initiated   Other Stroke Risk Factors  Hx of heart murmur  Other Active Problems   hypokalemia: k+ 3.3 replete today  Hospital day # 31  Delila Jacinto Halim, NP-C  ATTENDING NOTE: I reviewed above note and agree with the assessment and plan. Pt was seen and examined.   Neuro stable, no acute event. Pending SNF. Continue PT/OT/speech.   For detailed assessment and plan, please refer to above as I have made changes wherever appropriate.   Gena Fray, MD PhD Stroke Neurology 11/01/2020 6:26 PM       To contact Stroke Continuity provider, please refer to 11/03/2020. After hours, contact General Neurology

## 2020-11-01 NOTE — Progress Notes (Signed)
Physical Therapy Treatment Patient Details Name: Harry [name] MRN: [id number] DOB: 12-07-1975 Today's Date: 11/01/2020    History of Present Illness 45 yo male presenting to ED with left sided weakness. CT showing early ischemic changes around right MCA but no hypodensity. tPA given on 4/18 @1823 . 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 Awaiting MRI. PMH including heart murmur not on medications.    PT Comments    Pt asleep in bed on entry, takes increased encouragement to participate. Pt with good habit of asking to use BSC prior to working on ambulation. Able to transfer with min guard and perform self pericare with set up only. Afterward transfers back to bed with min guard. Trialed AFO with pair of slip on shoes to assist with independence however not compatible. Pt reluctantly agreeable to get to wheelchair to get out of room however once started and engaged with people in hallway, much more participatory. Pt able to ambulate and say hello to people in hallway. On second bout of ambulation able to walk to front desk with contact guard assist, ask for water and stand and drink it with L hand on desk to steady and no outside assist. Pt continues to progress his independence.    Follow Up Recommendations  SNF     Equipment Recommendations  3in1 (PT);Wheelchair cushion (measurements PT);Wheelchair (measurements PT)       Precautions / Restrictions Precautions Precautions: Fall;Other (comment) Precaution Comments: LUE hemiparesis, subluxing LUE Required Braces or Orthoses: Other Brace Other Brace: L cock up wrist splint, L AFO Restrictions Weight Bearing Restrictions: No    Mobility  Bed Mobility Overal bed mobility: Needs Assistance Bed Mobility: Supine to Sit     Supine to sit: Supervision     General bed mobility comments: supervision for safety    Transfers Overall transfer level: Needs assistance Equipment used: Hemi-walker;1 person hand  held assist;None Transfers: Sit to/from Sit to Stand: Min assist;Min guard Stand pivot transfers: Min guard       General transfer comment: min guard for stand pivot transfer from bed to BSC, and minA-min guard for numerous sit>stand from w/c during session  Ambulation/Gait Ambulation/Gait assistance: Min assist;+2 safety/equipment;Mod assist (+2 for chair follow) Gait Distance (Feet): 100 Feet (1x 50, 1 x100) Assistive device: Hemi-walker Gait Pattern/deviations: Step-to pattern;Decreased step length - right;Decreased step length - left;Decreased weight shift to left Gait velocity: decr Gait velocity interpretation: <1.8 ft/sec, indicate of risk for recurrent falls General Gait Details: able to ambulate today with contact guard assist due for safety, on second bout of ambulation able to say hello to people in the hallway, and stop at the front desk and ask for a drink of water and static stand to drink 1x tripping over L foot due to fatigue and decreased foot clearance with turning into his room       Wheelchair Mobility Wheelchair Mobility Wheelchair mobility: Yes Wheelchair propulsion: Right upper extremity;Right lower extremity Wheelchair parts: Supervision/cueing Distance: 100 Wheelchair Assistance Details (indicate cue type and reason): supervision for safety, cuing for brake management  Modified Rankin (Stroke Patients Only) Modified Rankin (Stroke Patients Only) Pre-Morbid Rankin Score: No symptoms Modified Rankin: Moderately severe disability     Balance Overall balance assessment: Needs assistance Sitting-balance support: Feet supported;Single extremity supported Sitting balance-Leahy Scale: Fair     Standing balance support: Bilateral upper extremity supported;Single extremity supported;No upper extremity supported Standing balance-Leahy Scale: Fair Standing balance comment: able to static stand with  close minguard this session                             Cognition Arousal/Alertness: Awake/alert Behavior During Therapy: Flat affect Overall Cognitive Status: Impaired/Different from baseline Area of Impairment: Following commands;Safety/judgement;Awareness;Attention;Problem solving;Memory                   Current Attention Level: Selective Memory: Decreased recall of precautions Following Commands: Follows one step commands consistently Safety/Judgement: Decreased awareness of safety;Decreased awareness of deficits Awareness: Emergent Problem Solving: Slow processing General Comments: requires increased encouragement to participate today         General Comments  RN asked to check BP as it has been high 144/66      Pertinent Vitals/Pain Pain Assessment: No/denies pain           PT Goals (current goals can now be found in the care plan section) Acute Rehab PT Goals Patient Stated Goal: to see Faith PT Goal Formulation: With patient/family Time For Goal Achievement: 10/16/20 Potential to Achieve Goals: Good Progress towards PT goals: Progressing toward goals    Frequency    Min 5X/week      PT Plan Current plan remains appropriate       AM-PAC PT "6 Clicks" Mobility   Outcome Measure  Help needed turning from your back to your side while in a flat bed without using bedrails?: A Little Help needed moving from lying on your back to sitting on the side of a flat bed without using bedrails?: A Little Help needed moving to and from a bed to a chair (including a wheelchair)?: A Little Help needed standing up from a chair using your arms (e.g., wheelchair or bedside chair)?: A Little Help needed to walk in hospital room?: A Little Help needed climbing 3-5 steps with a railing? : A Lot 6 Click Score: 17    End of Session Equipment Utilized During Treatment: Gait belt Activity Tolerance: Patient tolerated treatment well Patient left: with call bell/phone within reach;in  chair;with chair alarm set Nurse Communication: Mobility status PT Visit Diagnosis: Unsteadiness on feet (R26.81);Other abnormalities of gait and mobility (R26.89);Difficulty in walking, not elsewhere classified (R26.2);Hemiplegia and hemiparesis Hemiplegia - Right/Left: Left Hemiplegia - dominant/non-dominant: Non-dominant Hemiplegia - caused by: Cerebral infarction     Time: 1440-1531 PT Time Calculation (min) (ACUTE ONLY): 51 min  Charges:  $Gait Training: 23-37 mins $Therapeutic Activity: 8-22 mins $Wheel Chair Management: 8-22 mins                     Keaunna Skipper B. Beverely Risen PT, DPT Acute Rehabilitation Services Pager [phone] Office [phone]    Elon Alas Fleet 11/01/2020, 4:14 PM

## 2020-11-01 NOTE — Plan of Care (Signed)
  Problem: Education: Goal: Knowledge of General Education information will improve Description: Including pain rating scale, medication(s)/side effects and non-pharmacologic comfort measures Outcome: Progressing   Problem: Health Behavior/Discharge Planning: Goal: Ability to manage health-related needs will improve Outcome: Progressing   Problem: Clinical Measurements: Goal: Ability to maintain clinical measurements within normal limits will improve Outcome: Progressing Goal: Will remain free from infection Outcome: Progressing Goal: Diagnostic test results will improve Outcome: Progressing Goal: Respiratory complications will improve Outcome: Progressing Goal: Cardiovascular complication will be avoided Outcome: Progressing   Problem: Activity: Goal: Risk for activity intolerance will decrease Outcome: Progressing   Problem: Nutrition: Goal: Adequate nutrition will be maintained Outcome: Progressing   Problem: Coping: Goal: Level of anxiety will decrease Outcome: Progressing   Problem: Elimination: Goal: Will not experience complications related to bowel motility Outcome: Progressing Goal: Will not experience complications related to urinary retention Outcome: Progressing   Problem: Pain Managment: Goal: General experience of comfort will improve Outcome: Progressing   Problem: Safety: Goal: Ability to remain free from injury will improve Outcome: Progressing   Problem: Skin Integrity: Goal: Risk for impaired skin integrity will decrease Outcome: Progressing   Problem: Education: Goal: Knowledge of disease or condition will improve Outcome: Progressing Goal: Knowledge of secondary prevention will improve Outcome: Progressing Goal: Knowledge of patient specific risk factors addressed and post discharge goals established will improve Outcome: Progressing Goal: Individualized Educational Video(s) Outcome: Progressing   Problem: Ischemic Stroke/TIA Tissue  Perfusion: Goal: Complications of ischemic stroke/TIA will be minimized Outcome: Progressing   Problem: Ischemic Stroke/TIA Tissue Perfusion: Goal: Complications of ischemic stroke/TIA will be minimized Outcome: Progressing   

## 2020-11-01 NOTE — TOC Progression Note (Signed)
Transition of Care [facility]) - Progression Note    Patient Details  Name: Harry [name] MRN: [id number] Date of Birth: 1975-07-11  [facility]) CM/SW Contact  Erin Sons, [place] Phone Number: 11/01/2020, 10:49 AM  Clinical Narrative:     Spoke with [facility] liaison; she reports that they are still reviewing for decision.   Expected Discharge Plan: Skilled Nursing Facility Barriers to Discharge: Continued Medical Work up,Inadequate or no insurance  Expected Discharge Plan and Services Expected Discharge Plan: Skilled Nursing Facility     Post Acute Care Choice: Skilled Nursing Facility Living arrangements for the past 2 months: Apartment                                       Social Determinants of Health (SDOH) Interventions    Readmission Risk Interventions No flowsheet data found.

## 2020-11-01 NOTE — Progress Notes (Signed)
Occupational Therapy Treatment Patient Details Name: Harry [name] MRN: [id number] DOB: 05-31-1976 Today's Date: 11/01/2020    History of present illness 45 yo male presenting to ED with left sided weakness. CT showing early ischemic changes around right MCA but no hypodensity. tPA given on 4/18 @1823 . 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 Awaiting MRI. PMH including heart murmur not on medications.   OT comments  Pt received supine in bed agreeable to OT intervention. Session focus on BADL reeducation. Focus of session on household distance functional mobility with hemiwalker with pt needing min guard assist for safety. Pt would continue to benefit from skilled occupational therapy while admitted and after d/c to address the below listed limitations in order to improve overall functional mobility and facilitate independence with BADL participation. DC plan remains appropriate, will follow acutely per POC.    Follow Up Recommendations  SNF    Equipment Recommendations  3 in 1 bedside commode;Wheelchair (measurements OT);Wheelchair cushion (measurements OT)    Recommendations for Other Services      Precautions / Restrictions Precautions Precautions: Fall;Other (comment) Precaution Comments: LUE hemiparesis, subluxing LUE Required Braces or Orthoses: Other Brace Other Brace: L cock up wrist splint, L AFO Restrictions Weight Bearing Restrictions: No       Mobility Bed Mobility Overal bed mobility: Needs Assistance Bed Mobility: Supine to Sit;Sit to Supine     Supine to sit: Supervision Sit to supine: Supervision   General bed mobility comments: supervision for safety    Transfers Overall transfer level: Needs assistance Equipment used: Hemi-walker Transfers: Sit to/from Sit to Stand: Min guard   Squat pivot transfers: Min guard     General transfer comment: min guard sit<>stand from EOB and BSC, squat pivot from  EOB<>BSC with min guard assist    Balance Overall balance assessment: Needs assistance Sitting-balance support: Feet supported;Single extremity supported Sitting balance-Leahy Scale: Fair     Standing balance support: Single extremity supported;During functional activity;No upper extremity supported Standing balance-Leahy Scale: Fair Standing balance comment: able to stand at sink with no UE support for ADLs                           ADL either performed or assessed with clinical judgement   ADL Overall ADL's : Needs assistance/impaired     Grooming: Oral care;Standing;Supervision/safety;Cueing for safety Grooming Details (indicate cue type and reason): standing at sink to wash hands, cues to use L hand during grooming tasks Upper Body Bathing: Supervision/ safety;Set up;Sitting Upper Body Bathing Details (indicate cue type and reason): to wash UB from EOB Lower Body Bathing: Supervison/ safety;Set up;Sitting/lateral leans Lower Body Bathing Details (indicate cue type and reason): simulated via posterior pericare from [facility] with set- up assist of wash cloths Upper Body Dressing : Supervision/safety;Set up;Sitting Upper Body Dressing Details (indicate cue type and reason): to don [place] shirt cues for LUE first Lower Body Dressing: Supervision/safety;Set up;Sit to/from stand Lower Body Dressing Details (indicate cue type and reason): to don pants and brief,  MAX A to don L AFO, cues to don LLE first Toilet Transfer: Min [name] Details (indicate cue type and reason): min guard for safety, to squat pivot to L to [facility] Toileting- Clothing Manipulation and Hygiene: Supervision/safety;Set up;Sitting/lateral lean Toileting - Clothing Manipulation Details (indicate cue type and reason): pt able to complete posterior pericare via lateral leans from [facility]     Functional mobility during  ADLs: Min guard (hemi walker) General ADL Comments: session focus on BADL  reeducation from EOB and functional mobility within pts room     Vision       Perception     Praxis      Cognition Arousal/Alertness: Awake/alert Behavior During Therapy: Flat affect Overall Cognitive Status: Impaired/Different from baseline Area of Impairment: Following commands;Safety/judgement;Awareness;Attention;Problem solving;Memory                   Current Attention Level: Selective Memory: Decreased recall of precautions (repeated cues to don LUE/LLE first during dressing and cues to incorporate LUE into groomingt) Following Commands: Follows one step commands consistently;Follows multi-step commands with increased time Safety/Judgement: Decreased awareness of safety;Decreased awareness of deficits Awareness: Emergent Problem Solving: Slow processing General Comments: increased cues to incorporate L side into ADLS, perseverating on having high BP        Exercises     Shoulder Instructions       General Comments retaped L shoulder to asssit with L subluxation    Pertinent Vitals/ Pain       Pain Assessment: Faces Faces Pain Scale: Hurts a little bit Pain Location: L shoulder Pain Descriptors / Indicators: Sore Pain Intervention(s): Limited activity within patient's tolerance;Monitored during session;Repositioned;Other (comment) (applied new K tape)  Home Living                                          Prior Functioning/Environment              Frequency  Min 3X/week        Progress Toward Goals  OT Goals(current goals can now be found in the care plan section)  Progress towards OT goals: Progressing toward goals  Acute Rehab OT Goals Patient Stated Goal: none stated OT Goal Formulation: With patient Time For Goal Achievement: 11/13/20 Potential to Achieve Goals: Good  Plan Discharge plan remains appropriate;Frequency remains appropriate    Co-evaluation                 AM-PAC OT "6 Clicks" Daily Activity      Outcome Measure   Help from another person eating meals?: None Help from another person taking care of personal grooming?: A Little Help from another person toileting, which includes using toliet, bedpan, or urinal?: A Little Help from another person bathing (including washing, rinsing, drying)?: A Little Help from another person to put on and taking off regular upper body clothing?: A Little Help from another person to put on and taking off regular lower body clothing?: A Little 6 Click Score: 19    End of Session Equipment Utilized During Treatment: Rolling walker;Other (comment) (L AFO, hemiwalker)  OT Visit Diagnosis: Unsteadiness on feet (R26.81);Other abnormalities of gait and mobility (R26.89);Muscle weakness (generalized) (M62.81);Hemiplegia and hemiparesis;Other symptoms and signs involving cognitive function Hemiplegia - Right/Left: Left Hemiplegia - dominant/non-dominant: Non-Dominant Hemiplegia - caused by: Cerebral infarction   Activity Tolerance Patient tolerated treatment well   Patient Left in bed;with call bell/phone within reach;with bed alarm set   Nurse Communication Mobility status        Time: 7517-0017 OT Time Calculation (min): 51 min  Charges: OT General Charges $OT Visit: 1 Visit OT Treatments $Self Care/Home Management : 38-52 mins  Harry Derrick., COTA/L Acute Rehabilitation Services [phone]    Barron Schmid 11/01/2020, 11:29 AM

## 2020-11-02 LAB — CBC
HCT: 26.5 % — ABNORMAL LOW (ref 39.0–52.0)
Hemoglobin: 8.7 g/dL — ABNORMAL LOW (ref 13.0–17.0)
MCH: 28.7 pg (ref 26.0–34.0)
MCHC: 32.8 g/dL (ref 30.0–36.0)
MCV: 87.5 fL (ref 80.0–100.0)
Platelets: 325 10*3/uL (ref 150–400)
RBC: 3.03 MIL/uL — ABNORMAL LOW (ref 4.22–5.81)
RDW: 13.8 % (ref 11.5–15.5)
WBC: 5.2 10*3/uL (ref 4.0–10.5)
nRBC: 0 % (ref 0.0–0.2)

## 2020-11-02 LAB — MAGNESIUM: Magnesium: 1.6 mg/dL — ABNORMAL LOW (ref 1.7–2.4)

## 2020-11-02 LAB — BASIC METABOLIC PANEL
Anion gap: 7 (ref 5–15)
BUN: 13 mg/dL (ref 6–20)
CO2: 26 mmol/L (ref 22–32)
Calcium: 8 mg/dL — ABNORMAL LOW (ref 8.9–10.3)
Chloride: 106 mmol/L (ref 98–111)
Creatinine, Ser: 0.73 mg/dL (ref 0.61–1.24)
GFR, Estimated: >60 mL/min (ref >60)
Glucose, Bld: 97 mg/dL (ref 70–99)
Potassium: 3.4 mmol/L — ABNORMAL LOW (ref 3.5–5.1)
Sodium: 139 mmol/L (ref 135–145)

## 2020-11-02 LAB — RESP PANEL BY RT-PCR (FLU A&B, COVID) ARPGX2
Influenza A by PCR: NEGATIVE
Influenza B by PCR: NEGATIVE
SARS Coronavirus 2 by RT PCR: NEGATIVE

## 2020-11-02 MED ORDER — ATORVASTATIN CALCIUM 80 MG PO TABS: 80.0000 mg | ORAL_TABLET | Freq: Every day | ORAL | Status: DC

## 2020-11-02 MED ORDER — AMLODIPINE BESYLATE 10 MG PO TABS: 10.0000 mg | ORAL_TABLET | Freq: Every day | ORAL | Status: DC

## 2020-11-02 MED ORDER — POLYETHYLENE GLYCOL 3350 17 G PO PACK: 17.0000 g | PACK | Freq: Every day | ORAL | 0 refills | Status: DC | PRN

## 2020-11-02 MED ORDER — PANTOPRAZOLE SODIUM 40 MG PO PACK: 40.0000 mg | PACK | Freq: Every day | ORAL | Status: DC

## 2020-11-02 MED ORDER — CLOPIDOGREL BISULFATE 75 MG PO TABS: 75.0000 mg | ORAL_TABLET | Freq: Every day | ORAL | 3 refills | Status: AC

## 2020-11-02 MED ORDER — ASPIRIN 81 MG PO CHEW: 81.0000 mg | CHEWABLE_TABLET | Freq: Every day | ORAL | Status: DC

## 2020-11-02 MED ORDER — CITALOPRAM HYDROBROMIDE 20 MG PO TABS: 20.0000 mg | ORAL_TABLET | Freq: Every day | ORAL | Status: AC

## 2020-11-02 MED ORDER — SENNOSIDES-DOCUSATE SODIUM 8.6-50 MG PO TABS: 1.0000 | ORAL_TABLET | Freq: Every evening | ORAL | Status: DC | PRN

## 2020-11-02 MED ORDER — ACETAMINOPHEN 325 MG PO TABS: 650.0000 mg | ORAL_TABLET | ORAL | Status: DC | PRN

## 2020-11-02 NOTE — Progress Notes (Signed)
Physical Therapy Treatment Patient Details Name: Harry [name] MRN: [id number] DOB: December 15, 1975 Today's Date: 11/02/2020    History of Present Illness 45 yo male presenting to ED with left sided weakness. CT showing early ischemic changes around right MCA but no hypodensity. tPA given on 4/18 @1823 . 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 Awaiting MRI. PMH including heart murmur not on medications.    PT Comments    Pt supine in bed on entry awaiting call from SNF for possible placement today or tomorrow. Pt agreeable to work in room. Pt at level of mobility appropriate for [facility] test and pt able to participate in all activities. Pt with score of 33/55 putting him at high risk of falls and need for use of walker to prevent. Pt impressed with the activities he is able to do especially standing with his eyes closed. Pt took walk in hall before reporting he would like to return to bed and wait for phone call. Pt will do very well and SNF and will likely return to nearly full independence.    Follow Up Recommendations  SNF     Equipment Recommendations  3in1 (PT);Wheelchair cushion (measurements PT);Wheelchair (measurements PT)       Precautions / Restrictions Precautions Precautions: Fall;Other (comment) Precaution Comments: LUE hemiparesis, subluxing LUE Required Braces or Orthoses: Other Brace Other Brace: L cock up wrist splint, L AFO Restrictions Weight Bearing Restrictions: No    Mobility  Bed Mobility Overal bed mobility: Needs Assistance Bed Mobility: Supine to Sit     Supine to sit: Supervision Sit to supine: Supervision   General bed mobility comments: supervision for safety    Transfers Overall transfer level: Needs assistance Equipment used: Hemi-walker;1 person hand held assist;None Transfers: Sit to/from [facility] Sit to Stand: Min guard Stand pivot transfers: Min guard       General transfer comment:  multiple times during [employer] test, min guard and minor vc for safety  Ambulation/Gait Ambulation/Gait assistance: Min assist Gait Distance (Feet): 40 Feet Assistive device: Hemi-walker Gait Pattern/deviations: Decreased step length - right;Decreased step length - left;Decreased weight shift to left;Step-through pattern Gait velocity: decr Gait velocity interpretation: <1.8 ft/sec, indicate of risk for recurrent falls General Gait Details: ambulates in hallway with min A for balance      Modified Rankin (Stroke Patients Only) Modified Rankin (Stroke Patients Only) Pre-Morbid Rankin Score: No symptoms Modified Rankin: Moderately severe disability     Balance Overall balance assessment: Needs assistance Sitting-balance support: Feet supported;Single extremity supported Sitting balance-Leahy Scale: Fair     Standing balance support: Bilateral upper extremity supported;Single extremity supported;No upper extremity supported Standing balance-Leahy Scale: Fair Standing balance comment: able to static stand with close minguard this session                 Standardized Balance Assessment Standardized Balance Assessment : [employer] Test Berg Balance Test Sit to Stand: Able to stand  independently using hands Standing Unsupported: Able to stand 2 minutes with supervision Sitting with Back Unsupported but Feet Supported on Floor or Stool: Able to sit safely and securely 2 minutes Stand to Sit: Controls descent by using hands Transfers: Able to transfer safely, definite need of hands Standing Unsupported with Eyes Closed: Able to stand 10 seconds with supervision Standing Ubsupported with Feet Together: Able to place feet together independently and stand for 1 minute with supervision From Standing, Reach Forward with Outstretched Arm: Can reach forward >12 cm safely (  5") From Standing Position, Pick up Object from Floor: Able to pick up shoe, needs supervision From  Standing Position, Turn to Look Behind Over each Shoulder: Turn sideways only but maintains balance Turn 360 Degrees: Needs assistance while turning Standing Unsupported, Alternately Place Feet on Step/Stool: Needs assistance to keep from falling or unable to try Standing Unsupported, One Foot in Front: Needs help to step but can hold 15 seconds Standing on One Leg: Able to lift leg independently and hold equal to or more than 3 seconds Total Score: 33        Cognition Arousal/Alertness: Awake/alert Behavior During Therapy: Flat affect Overall Cognitive Status: Impaired/Different from baseline Area of Impairment: Following commands;Safety/judgement;Awareness;Attention;Problem solving;Memory                   Current Attention Level: Selective Memory: Decreased recall of precautions Following Commands: Follows one step commands consistently Safety/Judgement: Decreased awareness of safety;Decreased awareness of deficits Awareness: Emergent Problem Solving: Slow processing General Comments: requires increased encouragement to participate today         General Comments  VSS on RA      Pertinent Vitals/Pain Pain Assessment: No/denies pain           PT Goals (current goals can now be found in the care plan section) Acute Rehab PT Goals Patient Stated Goal: to see Faith PT Goal Formulation: With patient/family Time For Goal Achievement: 10/16/20 Potential to Achieve Goals: Good Progress towards PT goals: Progressing toward goals    Frequency    Min 5X/week      PT Plan Current plan remains appropriate       AM-PAC PT "6 Clicks" Mobility   Outcome Measure  Help needed turning from your back to your side while in a flat bed without using bedrails?: A Little Help needed moving from lying on your back to sitting on the side of a flat bed without using bedrails?: A Little Help needed moving to and from a bed to a chair (including a wheelchair)?: A Little Help  needed standing up from a chair using your arms (e.g., wheelchair or bedside chair)?: A Little Help needed to walk in hospital room?: A Little Help needed climbing 3-5 steps with a railing? : A Lot 6 Click Score: 17    End of Session Equipment Utilized During Treatment: Gait belt Activity Tolerance: Patient tolerated treatment well Patient left: with call bell/phone within reach;in chair;with chair alarm set Nurse Communication: Mobility status PT Visit Diagnosis: Unsteadiness on feet (R26.81);Other abnormalities of gait and mobility (R26.89);Difficulty in walking, not elsewhere classified (R26.2);Hemiplegia and hemiparesis Hemiplegia - Right/Left: Left Hemiplegia - dominant/non-dominant: Non-dominant Hemiplegia - caused by: Cerebral infarction     Time: 1311-1345 PT Time Calculation (min) (ACUTE ONLY): 34 min  Charges:  $Gait Training: 8-22 mins $Physical Performance Test: 8-22 mins                     Halaina Vanduzer B. Beverely Risen PT, DPT Acute Rehabilitation Services Pager [phone] Office [phone]    Elon Alas Fleet 11/02/2020, 2:18 PM

## 2020-11-02 NOTE — Progress Notes (Shared)
STROKE TEAM PROGRESS NOTE   SUBJECTIVE (INTERVAL HISTORY) No acute events. Neurologically stable. VSS.  Resting in bed in NAD. Awaiting SNF placement for about 3 weeks in difficult to place scenario.   OBJECTIVE Temp:  [98.2 F (36.8 C)-99 F (37.2 C)] 98.4 F (36.9 C) (05/20 0809) Pulse Rate:  [74-82] 75 (05/20 0809) Resp:  [16-18] 18 (05/20 0809) BP: (118-161)/(74-109) 150/105 (05/20 0809) SpO2:  [99 %-100 %] 99 % (05/20 0339)  No results for input(s): GLUCAP in the last 168 hours. Recent Labs  Lab 10/26/20 2142 10/29/20 0453 10/31/20 0357 11/02/20 0455  NA  --  137 139 139  K 3.1* 3.6 3.3* 3.4*  CL  --  103 105 106  CO2  --  27 27 26   GLUCOSE  --  93 91 97  BUN  --  8 14 13   CREATININE  --  0.62 0.65 0.73  CALCIUM  --  8.3* 8.0* 8.0*  MG  --  1.5* 1.7 1.6*   No results for input(s): AST, ALT, ALKPHOS, BILITOT, PROT, ALBUMIN in the last 168 hours. Recent Labs  Lab 10/29/20 0453 10/31/20 0357 11/02/20 0455  WBC 5.9 4.7 5.2  HGB 9.2* 8.7* 8.7*  HCT 27.5* 26.3* 26.5*  MCV 85.9 86.2 87.5  PLT 341 310 325        Component Value Date/Time   CHOL 297 (H) 10/02/2020 0607   TRIG 62 10/02/2020 0607   HDL 73 10/02/2020 0607   CHOLHDL 4.1 10/02/2020 0607   VLDL 12 10/02/2020 0607   LDLCALC 212 (H) 10/02/2020 0607   Lab Results  Component Value Date   HGBA1C 5.3 10/02/2020      Component Value Date/Time   LABOPIA NONE DETECTED 10/05/2020 1321   COCAINSCRNUR NONE DETECTED 10/05/2020 1321   LABBENZ NONE DETECTED 10/05/2020 1321   AMPHETMU NONE DETECTED 10/05/2020 1321   THCU NONE DETECTED 10/05/2020 1321   LABBARB NONE DETECTED 10/05/2020 1321     MR Brain 10/02/20 1. Acute infarcts in the anterior and posterior right frontal lobe. Associated edema with sulcal effacement. Mild curvilinear susceptibility artifact the regions of infarct likely represent petechial hemorrhage. No mass occupying acute hemorrhage. 2. Focal susceptibility artifact within a right  M2 MCA branch in the region of the more posterior frontal lobe infarct, compatible with thrombus and known occlusion better characterized on recent CTA. 3. 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 4. Large right mastoid effusion  CTA Chest 4/26/202  1. No pulmonary vascular malformation or acute intrathoracic abnormality. 2. Mild cardiomegaly  CT head 1. Acute infarcts in the anterior and posterior right frontal lobe. Associated edema with sulcal effacement. Mild curvilinear susceptibility artifact the regions of infarct likely represent petechial hemorrhage. No mass occupying acute hemorrhage. 2. Focal susceptibility artifact within a right M2 MCA branch in the region of the more posterior frontal lobe infarct, compatible with thrombus and known occlusion better characterized on recent CTA. 3. 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 4. Large right mastoid effusion  PHYSICAL EXAM  Temp:  [98.2 F (36.8 C)-99 F (37.2 C)] 98.4 F (36.9 C) (05/20 0809) Pulse Rate:  [74-82] 75 (05/20 0809) Resp:  [16-18] 18 (05/20 0809) BP: (118-161)/(74-109) 150/105 (05/20 0809) SpO2:  [99 %-100 %] 99 % (05/20 0339)  General -frail middle-age African-American male not in distress Resp: No extra work of breathing Cardiovascular - Regular rhythm and rate.  Neuro - awake alert and oriented  to age, place, time and people. No  aphasia, mild dysarthria but fluent language, following all simple commands. Able to name and repeat.  no gaze palsy now, but still more right gaze preference, visual field full no hemianopia, PERRL.  Left facial droop. Tongue midline.  Right upper and lower extremity at least 4/5, left upper extremity deltoid and bicep 3-/5, tricep and finger movement 0/5, left lower extremity 3/5 proximal and distal PF/DF 1/5.  Sensation decreased on the left. Right finger-to-nose intact.  Gait not tested.    ASSESSMENT/PLAN Mr. DOUG BUCKLIN is a 45  y.o. male with PMH of heart murmur not on medications presented to ER for acute onset left arm and leg weakness, left visual and sensory neglect, right gaze preference, left facial droop nausea/vomiting. tPA given.  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  Discussed with patient regarding risk and benefit of thrombectomy, patient declined thrombectomy.     Stroke: Right MCA infarct, embolic pattern, source unclear but likely cardioembolic (see below)  CT head early ischemic changes right MCA but no hypodensity  CTA head neck was done showed right M2/M3 occlusion.   MRI right moderate MCA cortical infarcts   2D Echo EF 40 to 45%  LE venous Doppler no DVT  TEE EF 35-40%, no clot but LA appendage with SEC and reduced LAA emptying velocity. positive for PFO  TCD bubble study minimal (Spencer degree 2 ) PFO  LE venous Doppler negative for DVT  LDL 212  HgbA1c 5.3  UDS negative  Lovenox for VTE prophylaxis  No antithrombotic prior to admission, now on aspirin 81mg  daily and clopidogrel 75 mg daily DAPT for 3 months and then aspirin alone given LVO intracranially.  Patient counseled to be compliant with his antithrombotic medications  Ongoing aggressive stroke risk factor management Continue ongoing therapies.  No changes.  Patient is medically stable for transfer to skilled nursing facility when bed available.    Therapy recommendations: SNF, appreciate the work of the therapy team who has coordinated rehab like experience for patient as he awaits placement  Disposition: SNF  Cardiomyopathy  EF 40 to 45%  TEE EF 35-40% with LAA SEC and reduced emptying velocity  Cardiology Dr. on board  CTA chest no pulmonary AVM  outpt follow up with Dr. Jacinto Halim  ?? PFO  TEE positive for bilateral shunting suggestive of PFO  TCD bubble study minmal (Spencer degree 2 ) PFO  CTA chest no pulmonary AVM  Follow up with Dr. Jacinto Halim as  outpt  Hypertension . Better controlled . On amlodipine 10 mg daily  Long term BP goal normotensive  Hyperlipidemia  Home meds: None  LDL 212, goal < 70  Now on Lipitor 80  Continue statin at discharge  Dysphagia  Mild dysarthria  On dysphagia 2 and thin liquid  intermittent cough improving  Depressed mood  Dealing with new stroke deficits and reporting depressed mood  Less participation reported by therapy staff  Celexa initiated   Other Stroke Risk Factors  Hx of heart murmur  Other Active Problems   hypokalemia: k+ 3.3 replete today  Hospital day # 32  Andreus Cure Jacinto Halim, NP-C  ATTENDING NOTE: I reviewed above note and agree with the assessment and plan. Pt was seen and examined.   Neuro stable, no acute event. Pending SNF. Continue PT/OT/speech.   For detailed assessment and plan, please refer to above as I have made changes wherever appropriate.   Gena Fray, MD PhD Stroke Neurology 11/02/2020 8:28 AM  To contact Stroke Continuity provider, please refer to [email or web address] After hours, contact General Neurology

## 2020-11-02 NOTE — Progress Notes (Signed)
Occupational Therapy Treatment Note  Pt is making excellent progress toward goal of becoming modified independent with ADL tasks. Ambulated to bathroom to use standard toilet @ hemi-walker level with min A. Completed bathing/dressing at sink @ sit - stand level with min A for UB dressing and mod A for LB ADL. Pt distracted this session by potential of discharging to rehab. Continues to be an excellent rehab candidate at SNF to reach goal of becoming modified independent.     11/02/20 1453  OT Visit Information  Last OT Received On 11/02/20  Assistance Needed +1  History of Present Illness 45 yo male presenting to ED with left sided weakness. CT showing early ischemic changes around right MCA but no hypodensity. tPA given on 4/18 @1823 . 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 Awaiting MRI. PMH including heart murmur not on medications.  Precautions  Precautions Fall;Other (comment)  Precaution Comments LUE hemiparesis, subluxing LUE  Required Braces or Orthoses Other Brace  Other Brace L cock up wrist splint, L AFO; K-tape L shoulder  Pain Assessment  Pain Assessment No/denies pain  Cognition  Arousal/Alertness Awake/alert  Behavior During Therapy Flat affect  Overall Cognitive Status Impaired/Different from baseline  Area of Impairment Following commands;Safety/judgement;Awareness;Attention;Problem solving;Memory  Current Attention Level Selective  Memory Decreased recall of precautions  Following Commands Follows one step commands consistently  Safety/Judgement Decreased awareness of safety;Decreased awareness of deficits  Awareness Emergent  Problem Solving Slow processing  Upper Extremity Assessment  Upper Extremity Assessment LUE deficits/detail  ADL  Eating/Feeding Set up;Sitting  Grooming Set up;Standing;Supervision/safety  Upper Body Bathing Set up;Supervision/ safety;Sitting  Lower Body Bathing Supervison/ safety;Sit to/from stand  Upper Body  Dressing  Supervision/safety;Set up;Sitting  Lower Body Dressing Minimal assistance;Sit to/from Minimal assistance;Ambulation (hemi walker - walked to bathroom and used standard toilet)  Toileting- Clothing Manipulation and Hygiene Set up;Supervision/safety;Sitting/lateral lean  Functional mobility during ADLs Minimal assistance (ambulating with hemi walker)  Bed Mobility  Overal bed mobility Modified Independent  Balance  Overall balance assessment Needs assistance  Sitting-balance support Feet supported;Single extremity supported  Sitting balance-Leahy Scale Fair  Standing balance support Bilateral upper extremity supported;Single extremity supported;No upper extremity supported  Standing balance-Leahy Scale Fair  Standing balance comment able to static stand with close minguard this session  Vision- Assessment  Additional Comments decreased visual attention  Transfers  Overall transfer level Needs assistance  Equipment used Hemi-walker;1 person hand held assist;None  Transfers Sit to/from Stand  Sit to Stand Min guard  General Exercises - Upper Extremity  Shoulder Flexion AROM;Left;Supine  Elbow Flexion Left;AROM;10 reps;Seated  Elbow Extension AROM;Left;10 reps;Seated  OT - End of Session  Equipment Utilized During Treatment Gait belt;Other (comment) (hemi walker)  Activity Tolerance Patient tolerated treatment well  Patient left in bed;with call bell/phone within reach;with bed alarm set (declined sitting in chair)  Nurse Communication Mobility status  OT Assessment/Plan  OT Plan Discharge plan remains appropriate;Frequency remains appropriate  OT Visit Diagnosis Unsteadiness on feet (R26.81);Other abnormalities of gait and mobility (R26.89);Muscle weakness (generalized) (M62.81);Hemiplegia and hemiparesis;Other symptoms and signs involving cognitive function  Hemiplegia - Right/Left Left  Hemiplegia - dominant/non-dominant Non-Dominant  Hemiplegia -  caused by Cerebral infarction  OT Frequency (ACUTE ONLY) Min 3X/week  Recommendations for Other Services Rehab consult;PT consult;Speech consult  Follow Up Recommendations SNF  OT Equipment 3 in 1 bedside commode;Wheelchair (measurements OT);Wheelchair cushion (measurements OT)  AM-PAC OT "6 Clicks" Daily Activity Outcome Measure (Version 2)  Help  from another person eating meals? 4  Help from another person taking care of personal grooming? 3  Help from another person toileting, which includes using toliet, bedpan, or urinal? 3  Help from another person bathing (including washing, rinsing, drying)? 3  Help from another person to put on and taking off regular upper body clothing? 3  Help from another person to put on and taking off regular lower body clothing? 2  6 Click Score 18  OT Goal Progression  Progress towards OT goals Progressing toward goals  Acute Rehab OT Goals  Patient Stated Goal to see Faith  OT Goal Formulation With patient  Time For Goal Achievement 11/13/20  Potential to Achieve Goals Good  ADL Goals  Pt Will Perform Grooming with supervision;standing  Pt Will Perform Upper Body Dressing with modified independence;sitting  Pt Will Transfer to Toilet with modified independence;ambulating  Pt Will Perform Toileting - Clothing Manipulation and hygiene with modified independence;sitting/lateral leans  Additional ADL Goal #1 Pt will direct level of care for bowel program 75% of time with min vc for safety awareness  Additional ADL Goal #2 Pt will incorporate LUE into all mobility with min vc to increase functional use of LUE adn reduce risk of injury  Additional ADL Goal #3 Pt will identify 5/5 objects in L visual field with minimal VC to increase to increase independence with ADL  Additional ADL Goal #4 Pt will tolerate L wrist cock up splint for at least 2 hour time intervals to improve functional position of L wrist and reduce risk of injury  Pt Will Perform Lower Body  Dressing with modified independence;sit to/from stand;bed level  OT Time Calculation  OT Start Time (ACUTE ONLY) 1045  OT Stop Time (ACUTE ONLY) 1130  OT Time Calculation (min) 45 min  OT General Charges  $OT Visit 1 Visit  OT Treatments  $Self Care/Home Management  38-52 mins  Luisa Dago, OT/L   Acute OT Clinical Specialist Acute Rehabilitation Services Pager [phone] Office [phone]

## 2020-11-02 NOTE — Discharge Summary (Addendum)
Stroke Discharge Summary  Patient ID: Harry [name]   MRN: [id number]      DOB: April 16, 1976  Date of Admission: 10/01/2020 Date of Discharge: 11/02/2020  Attending Physician:  Dr. Marvel Plan.  Consultant(s):    Patient's PCP:  Pcp, No  DISCHARGE DIAGNOSIS:  1.  Right MCA infarct, embolic pattern, source unclear but likely cardioembolic  2. Left sided weakness and sensory impairments 3. Possible PFO 4. Cardiomyopathy 5. HLD 6. Depressed Mood 7. HTN 8. Inadequate social support 9. Inadequate financial support    Active Problems:   Stroke (cerebrum) (HCC)   Benign essential HTN   FUO (fever of unknown origin)   Heart murmur   Hypokalemia   Cerebral infarction (HCC)  Allergies as of 11/02/2020   No Known Allergies     Medication List    TAKE these medications   acetaminophen 325 MG tablet Commonly known as: TYLENOL Take 2 tablets (650 mg total) by mouth every 4 (four) hours as needed for mild pain (or temp > 37.5 C (99.5 F)).   amLODipine 10 MG tablet Commonly known as: NORVASC Take 1 tablet (10 mg total) by mouth daily. Start taking on: Nov 03, 2020   aspirin 81 MG chewable tablet Chew 1 tablet (81 mg total) by mouth daily. Start taking on: Nov 03, 2020   atorvastatin 80 MG tablet Commonly known as: LIPITOR Take 1 tablet (80 mg total) by mouth daily. Start taking on: Nov 03, 2020   citalopram 20 MG tablet Commonly known as: CELEXA Take 1 tablet (20 mg total) by mouth daily. Start taking on: Nov 03, 2020   clopidogrel 75 MG tablet Commonly known as: PLAVIX Take 1 tablet (75 mg total) by mouth daily.   pantoprazole sodium 40 mg/20 mL Pack Commonly known as: PROTONIX Take 20 mLs (40 mg total) by mouth at bedtime.   polyethylene glycol 17 g packet Commonly known as: MIRALAX / GLYCOLAX Take 17 g by mouth daily as needed (Constipation).   senna-docusate 8.6-50 MG tablet Commonly known as: Senokot-S Take 1 tablet by mouth at bedtime as needed for  mild constipation.       LABORATORY STUDIES CBC    Component Value Date/Time   WBC 5.2 11/02/2020 0455   RBC 3.03 (L) 11/02/2020 0455   HGB 8.7 (L) 11/02/2020 0455   HCT 26.5 (L) 11/02/2020 0455   PLT 325 11/02/2020 0455   MCV 87.5 11/02/2020 0455   MCH 28.7 11/02/2020 0455   MCHC 32.8 11/02/2020 0455   RDW 13.8 11/02/2020 0455   LYMPHSABS 1.5 10/01/2020 1805   MONOABS 0.8 10/01/2020 1805   EOSABS 0.0 10/01/2020 1805   BASOSABS 0.1 10/01/2020 1805   CMP    Component Value Date/Time   NA 139 11/02/2020 0455   K 3.4 (L) 11/02/2020 0455   CL 106 11/02/2020 0455   CO2 26 11/02/2020 0455   GLUCOSE 97 11/02/2020 0455   BUN 13 11/02/2020 0455   CREATININE 0.73 11/02/2020 0455   CALCIUM 8.0 (L) 11/02/2020 0455   PROT 4.2 (L) 10/13/2020 0245   ALBUMIN 1.4 (L) 10/13/2020 0245   AST 28 10/13/2020 0245   ALT 17 10/13/2020 0245   ALKPHOS 51 10/13/2020 0245   BILITOT 0.4 10/13/2020 0245   GFRNONAA >60 11/02/2020 0455   COAGS Lab Results  Component Value Date   INR 1.1 10/01/2020   Lipid Panel    Component Value Date/Time   CHOL 297 (H) 10/02/2020 0607   TRIG  62 10/02/2020 0607   HDL 73 10/02/2020 0607   CHOLHDL 4.1 10/02/2020 0607   VLDL 12 10/02/2020 0607   LDLCALC 212 (H) 10/02/2020 0607   HgbA1C  Lab Results  Component Value Date   HGBA1C 5.3 10/02/2020   Urinalysis    Component Value Date/Time   COLORURINE YELLOW 10/06/2020 1441   APPEARANCEUR CLOUDY (A) 10/06/2020 1441   LABSPEC 1.026 10/06/2020 1441   PHURINE 5.0 10/06/2020 1441   GLUCOSEU 50 (A) 10/06/2020 1441   HGBUR MODERATE (A) 10/06/2020 1441   BILIRUBINUR NEGATIVE 10/06/2020 1441   KETONESUR NEGATIVE 10/06/2020 1441   PROTEINUR >=300 (A) 10/06/2020 1441   NITRITE NEGATIVE 10/06/2020 1441   LEUKOCYTESUR TRACE (A) 10/06/2020 1441   Urine Drug Screen     Component Value Date/Time   LABOPIA NONE DETECTED 10/05/2020 1321   COCAINSCRNUR NONE DETECTED 10/05/2020 1321   LABBENZ NONE DETECTED  10/05/2020 1321   AMPHETMU NONE DETECTED 10/05/2020 1321   THCU NONE DETECTED 10/05/2020 1321   LABBARB NONE DETECTED 10/05/2020 1321    Alcohol Level No results found for: [facility]  SIGNIFICANT DIAGNOSTIC STUDIES MR Brain 10/02/20 1. Acute infarcts in the anterior and posterior right frontal lobe. Associated edema with sulcal effacement. Mild curvilinear susceptibility artifact the regions of infarct likely represent petechial hemorrhage. No mass occupying acute hemorrhage. 2. Focal susceptibility artifact within a right M2 MCA branch in the region of the more posterior frontal lobe infarct, compatible with thrombus and known occlusion better characterized on recent CTA. 3. 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 4. Large right mastoid effusion  CTA Chest 4/26/202  1. No pulmonary vascular malformation or acute intrathoracic abnormality. 2. Mild cardiomegaly  CT head 1. Acute infarcts in the anterior and posterior right frontal lobe. Associated edema with sulcal effacement. Mild curvilinear susceptibility artifact the regions of infarct likely represent petechial hemorrhage. No mass occupying acute hemorrhage. 2. Focal susceptibility artifact within a right M2 MCA branch in the region of the more posterior frontal lobe infarct, compatible with thrombus and known occlusion better characterized on recent CTA. 3. 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 4. Large right mastoid effusion  HISTORY OF PRESENT ILLNESS Harry [name] is a 45 y.o. male with PMH of heart murmur not on medications presented to ER for acute onset left arm and leg weakness, left visual and sensory neglect, right gaze preference, left facial droop nausea/vomiting. tPA given.  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  Discussed with patient regarding risk and benefit of thrombectomy, patient declined thrombectomy.     HOSPITAL COURSE [name] was  was admitted to the neurologic ICU for post thrombolytic intensive monitoring and care. Once stable he was transferred to the neurologic floor where his left sided weakness improved with time. He had an extended hospital stay of 3 weeks past medical readiness due to lack of social support for returning home and financial issues. He was eventually able to secure a SNF bed to which he is being transferred today. His hospital problems are presented below:   Stroke: Right MCA infarct, embolic pattern, source unclear but likely cardioembolic (see below)  CT head early ischemic changes right MCA but no hypodensity  CTA head neck was done showed right M2/M3 occlusion.   MRI right moderate MCA cortical infarcts   2D Echo EF 40 to 45%  LE venous Doppler no DVT  TEE EF 35-40%, no clot but LA appendage with SEC and reduced  LAA emptying velocity. positive for PFO  TCD bubble study minimal (Spencer degree 2 ) PFO  LE venous Doppler negative for DVT  LDL 212  HgbA1c 5.3  UDS negative  Lovenox for VTE prophylaxis  No antithrombotic prior to admission, now on aspirin 81mg  daily and clopidogrel 75 mg daily DAPT for 3 months and then aspirin alone given LVO intracranially.  Patient counseled to be compliant with his antithrombotic medications  Ongoing aggressive stroke risk factor management Continue ongoing therapies.  No changes.  Patient is medically stable for transfer to skilled nursing facility when bed available.    Therapy recommendations: SNF, waited 3 weeks on placement due to financial issues.   Disposition: SNF  Cardiomyopathy  EF 40 to 45%  TEE EF 35-40% with LAA SEC and reduced emptying velocity  Cardiology Dr. on board  CTA chest no pulmonary AVM  outpt follow up with Dr. Jacinto Halim  ?? PFO  TEE positive for bilateral shunting suggestive of PFO  TCD bubble study minmal (Spencer degree 2 ) PFO  CTA chest no pulmonary AVM  Follow up with Dr. Jacinto Halim as  outpt  Hypertension . Better controlled . On amlodipine 10 mg daily  Long term BP goal normotensive  Hyperlipidemia  Home meds: None  LDL 212, goal < 70  Now on Lipitor 80  Continue statin at discharge  Dysphagia  Mild dysarthria  Able to progress to regular diet with thin liquids, with assistance  intermittent cough improving   Depressed mood  Dealing with new stroke deficits and reporting depressed mood  Less participation reported by therapy staff  Celexa initiated   Other Stroke Risk Factors  Hx of heart murmur  DISCHARGE EXAM Blood pressure (!) 150/105, pulse 75, temperature 98.4 F (36.9 C), temperature source Oral, resp. rate 18, height 5\' 7"  (1.702 m), weight 59.6 kg, SpO2 99 %. General -frail middle-age African-American male not in distress Resp: No extra work of breathing Cardiovascular - Regular rhythm and rate. Neuro - awake alert and oriented  to age, place, time and people. No aphasia, mild dysarthria but fluent language, following all simple commands. Able to name and repeat.  no gaze palsy now, but still more right gaze preference, visual field full no hemianopia, PERRL.  Left facial droop. Tongue midline.  Right upper and lower extremity at least 4/5, left upper extremity deltoid and bicep 3-/5, tricep and finger movement 0/5, left lower extremity 3/5 proximal and distal PF/DF 1/5.  Sensation decreased on the left. Right finger-to-nose intact.  Gait not tested.   Discharge Diet       Diet   Diet regular Room service appropriate? Yes with Assist; Fluid consistency: Thin   liquids  DISCHARGE PLAN  Disposition:  SNF  ASA 81mg  and Plavix 75 mg daily x 3 months (end date June 9th) then Aspirin 81 mg alone  Ongoing stroke risk factor control by Primary Care Physician at time of discharge  Follow-up PCP Pcp, No in 2 weeks.  Follow-up with Dr. at [facility] in 4 weeks, office to schedule an appointment.   Follow up with Dr. 01-15-2002  cardiology for cardiomyopathy.   40 minutes were spent preparing discharge.  Delila A Bailey-Modzik, NP-C  ATTENDING NOTE: I reviewed above note and agree with the assessment and plan. Pt was seen and examined.   Patient neurologically stable, no acute event overnight.  Had a bed in SNF, will discharge to SNF today.  Patient will continue aspirin and Plavix for 3  months and then aspirin alone.  Continue statin.  We will follow-up with Dr. Pearlean Brownie at [facility] in 4 weeks, will follow up with Dr. Jacinto Halim at cardiology in 4 weeks also.  For detailed assessment and plan, please refer to above as I have made changes wherever appropriate.   Marvel Plan, MD PhD Stroke Neurology 11/02/2020 6:03 PM     To contact Stroke Continuity provider, please refer to [email or web address]. After hours, contact General Neurology

## 2020-11-02 NOTE — TOC Progression Note (Signed)
Transition of Care [facility]) - Progression Note    Patient Details  Name: Harry [name] MRN: [id number] Date of Birth: 05/12/1976  Transition of Care [facility]) CM/SW Contact  Erin Sons, [place] Phone Number: 11/02/2020, 10:45 AM  Clinical Narrative:     CSW spoke with Mariella Saa at [facility]. [facility] facility can offer a bed for today with LOG and medicaid pending. TOC to fax LOG to [phone].  CSW spoke with pt and notified him of bed available today. He explained he is going to call his brother and discuss with him. Rapid Covid test ordered.    Expected Discharge Plan: Skilled Nursing Facility Barriers to Discharge: Continued Medical Work up,Inadequate or no insurance  Expected Discharge Plan and Services Expected Discharge Plan: Skilled Nursing Facility     Post Acute Care Choice: Skilled Nursing Facility Living arrangements for the past 2 months: Apartment                                       Social Determinants of Health (SDOH) Interventions    Readmission Risk Interventions No flowsheet data found.

## 2020-11-02 NOTE — TOC Transition Note (Signed)
Transition of Care [facility]) - CM/SW Discharge Note   Patient Details  Name: Harry [name] MRN: [id number] Date of Birth: Jul 07, 1975  Transition of Care [facility]) CM/SW Contact:  Erin Sons, LCSW Phone Number: 11/02/2020, 2:25 PM   Clinical Narrative:     Patient will DC to:  [facility] Anticipated DC date: 11/02/20 Family notified: Pt notified his brother Transport by: Sharin Mons   Per MD patient ready for DC to [facility]. RN, patient, patient's family, and facility notified of DC. Discharge Summary and FL2 sent to facility. RN to call report prior to discharge [phone] room 137A). DC packet on chart. Ambulance transport requested for patient.   CSW will sign off for now as social work intervention is no longer needed. Please consult [place] again if new needs arise.   Final next level of care: Skilled Nursing Facility Barriers to Discharge: No Barriers Identified   Patient Goals and CMS Choice Patient states their goals for this hospitalization and ongoing recovery are:: to get better CMS Medicare.gov Compare Post Acute Care list provided to:: Patient Choice offered to / list presented to : Patient,Sibling  Discharge Placement              Patient chooses bed at: [facility]) Patient to be transferred to facility by: [facility] Name of family member notified: Pt notified his brother Patient and family notified of of transfer: 11/02/20  Discharge Plan and Services     Post Acute Care Choice: Skilled Nursing Facility                               Social Determinants of Health (SDOH) Interventions     Readmission Risk Interventions No flowsheet data found.

## 2020-11-02 NOTE — Progress Notes (Signed)
Pt has been discharged from the unit. All equipment has been sent and all IV and tele has been disconnected. Pt sent in good spirits.

## 2020-11-05 ENCOUNTER — Telehealth: Payer: Self-pay | Admitting: Cardiology

## 2020-11-08 NOTE — Telephone Encounter (Signed)
Patient will call back

## 2020-12-02 NOTE — Progress Notes (Signed)
Primary Physician/Referring:  Pcp, No  Patient ID: AB LEAMING, male    DOB: [birth year], 45 y.o.   MRN: [id number]  Chief Complaint  Patient presents with   Hypertension   Hospitalization Follow-up   New Patient (Initial Visit)   Cerebrovascular Accident   HPI:    Harry [name]  is a 45 y.o. male with history of hypertension, hyperlipidemia, non-ischemic cardiomyopathy likely secondary to untreated hypertension. He presented to [facility] 10/09/2020 with new onset left arm and leg weakness where he received tPA for acte stroke. TEE found right to left shunting suggestive of pulmonary AV fistula, ASD/PFO could not be ruled out.   Patient was seen and evaluated by Dr Jacinto Halim in the hospital and recommended CTA of the chest to further evaluate. CTA showed no pulmonary vascular malformation. As CTA was negative  was recommended to consider intracardiac echo guided therapy with possible repair, right heart cath, and 2 week cardiac monitor to evaluate for underlying atrial fibrillation.   Patient now presents for follow up accompanied by aide from assisted living facility where patient resides. He is wheelchair bound primarily following recent stroke due to residual left sided weakness. Patient is feeling relatively well since discharge and is without specific complaints today. Aide reports patient's blood pressure at home is uncontrolled.  Past Medical History:  Diagnosis Date   Hypertension    Past Surgical History:  Procedure Laterality Date   BUBBLE STUDY  10/08/2020   Procedure: BUBBLE STUDY;  Surgeon: Quintella Reichert, MD;  Location: [facility] ENDOSCOPY;  Service: Cardiovascular;;   TEE WITHOUT CARDIOVERSION N/A 10/08/2020   Procedure: TRANSESOPHAGEAL ECHOCARDIOGRAM (TEE);  Surgeon: Quintella Reichert, MD;  Location: [facility] ENDOSCOPY;  Service: Cardiovascular;  Laterality: N/A;   Family History  Problem Relation Age of Onset   Cervical cancer Mother    Stroke Father    Heart Problems Sister         died from blood clot   Diabetes Brother     Social History   Tobacco Use   Smoking status: Never   Smokeless tobacco: Never  Substance Use Topics   Alcohol use: Never   Marital Status: Single   ROS  Review of Systems  Cardiovascular:  Negative for chest pain, claudication, leg swelling, near-syncope, orthopnea, palpitations, paroxysmal nocturnal dyspnea and syncope.  Respiratory:  Negative for shortness of breath.    Objective  Blood pressure (!) 168/108, pulse (!) 115, temperature (!) 97 F (36.1 C), temperature source Temporal, resp. rate 16, height 5\' 7"  (1.702 m), weight 118 lb 6.4 oz (53.7 kg), SpO2 97 %.  Vitals with BMI 12/03/2020 11/02/2020 11/02/2020  Height 5\' 7"  - -  Weight 118 lbs 6 oz - -  BMI 18.54 - -  Systolic 168 125 11/04/2020  Diastolic 108 75 100  Pulse 115 73 73      Physical Exam Vitals reviewed.  Constitutional:      Comments: In wheelchair  HENT:     Head: Normocephalic and atraumatic.  Cardiovascular:     Rate and Rhythm: Normal rate and regular rhythm.     Pulses: Intact distal pulses.     Heart sounds: S1 normal and S2 normal. No murmur heard.   No gallop.  Pulmonary:     Effort: Pulmonary effort is normal. No respiratory distress.     Breath sounds: No wheezing, rhonchi or rales.  Musculoskeletal:     Right lower leg: No edema.     Left lower leg: No  edema.  Neurological:     Mental Status: He is alert.     Comments: Left hemiplegia noted     Laboratory examination:   Recent Labs    10/29/20 0453 10/31/20 0357 11/02/20 0455  NA 137 139 139  K 3.6 3.3* 3.4*  CL 103 105 106  CO2 27 27 26   GLUCOSE 93 91 97  BUN 8 14 13   CREATININE 0.62 0.65 0.73  CALCIUM 8.3* 8.0* 8.0*  GFRNONAA >60 >60 >60   CrCl cannot be calculated (Patient's most recent lab result is older than the maximum 21 days allowed.).  CMP Latest Ref Rng & Units 11/02/2020 10/31/2020 10/29/2020  Glucose 70 - 99 mg/dL 97 91 93  BUN 6 - 20 mg/dL 13 14 8   Creatinine  0.61 - 1.24 mg/dL 11/02/2020 10/31/2020  Sodium 135 - 145 mmol/L 139 139 137  Potassium 3.5 - 5.1 mmol/L 3.4(L) 3.3(L) 3.6  Chloride 98 - 111 mmol/L 106 105 103  CO2 22 - 32 mmol/L 26 27 27   Calcium 8.9 - 10.3 mg/dL 8.0(L) 8.0(L) 8.3(L)  Total Protein 6.5 - 8.1 g/dL - - -  Total Bilirubin 0.3 - 1.2 mg/dL - - -  Alkaline Phos 38 - 126 U/L - - -  AST 15 - 41 U/L - - -  ALT 0 - 44 U/L - - -   CBC Latest Ref Rng & Units 11/02/2020 10/31/2020 10/29/2020  WBC 4.0 - 10.5 K/uL 5.2 4.7 5.9  Hemoglobin 13.0 - 17.0 g/dL ) 11/04/2020) 11/02/2020)  Hematocrit 39.0 - 52.0 % 26.5(L) 26.3(L) 27.5(L)  Platelets 150 - 400 K/uL 325 310 341    Lipid Panel Recent Labs    10/02/20 0607  CHOL 297*  TRIG 62  LDLCALC 212*  VLDL 12  HDL 73  CHOLHDL 4.1    HEMOGLOBIN A1C Lab Results  Component Value Date   HGBA1C 5.3 10/02/2020   MPG 105.41 10/02/2020   TSH Recent Labs    10/06/20 1648  TSH 1.055    External labs:  None   Allergies  No Known Allergies    Medications Prior to Visit:   Outpatient Medications Prior to Visit  Medication Sig Dispense Refill   acetaminophen (TYLENOL) 325 MG tablet Take 2 tablets (650 mg total) by mouth every 4 (four) hours as needed for mild pain (or temp > 37.5 C (99.5 F)).     amLODipine (NORVASC) 10 MG tablet Take 1 tablet (10 mg total) by mouth daily.     aspirin 81 MG chewable tablet Chew 1 tablet (81 mg total) by mouth daily.     atorvastatin (LIPITOR) 80 MG tablet Take 1 tablet (80 mg total) by mouth daily.     citalopram (CELEXA) 20 MG tablet Take 1 tablet (20 mg total) by mouth daily.     clopidogrel (PLAVIX) 75 MG tablet Take 1 tablet (75 mg total) by mouth daily. 30 tablet 3   MAGNESIUM OXIDE 400 PO Take 1 tablet by mouth daily.     pantoprazole sodium (PROTONIX) 40 mg/20 mL PACK Take 20 mLs (40 mg total) by mouth at bedtime. 30 mL    polyethylene glycol (MIRALAX / GLYCOLAX) 17 g packet Take 17 g by mouth daily as needed (Constipation). 14 each 0    senna-docusate (SENOKOT-S) 8.6-50 MG tablet Take 1 tablet by mouth at bedtime as needed for mild constipation.     No facility-administered medications prior to visit.     Final Medications at End of Visit  Current Meds  Medication Sig   acetaminophen (TYLENOL) 325 MG tablet Take 2 tablets (650 mg total) by mouth every 4 (four) hours as needed for mild pain (or temp > 37.5 C (99.5 F)).   amLODipine (NORVASC) 10 MG tablet Take 1 tablet (10 mg total) by mouth daily.   aspirin 81 MG chewable tablet Chew 1 tablet (81 mg total) by mouth daily.   atorvastatin (LIPITOR) 80 MG tablet Take 1 tablet (80 mg total) by mouth daily.   citalopram (CELEXA) 20 MG tablet Take 1 tablet (20 mg total) by mouth daily.   clopidogrel (PLAVIX) 75 MG tablet Take 1 tablet (75 mg total) by mouth daily.   losartan (COZAAR) 25 MG tablet Take 1 tablet (25 mg total) by mouth daily.   MAGNESIUM OXIDE 400 PO Take 1 tablet by mouth daily.   metoprolol succinate (TOPROL-XL) 50 MG 24 hr tablet Take 1 tablet (50 mg total) by mouth daily. Take with or immediately following a meal.   pantoprazole sodium (PROTONIX) 40 mg/20 mL PACK Take 20 mLs (40 mg total) by mouth at bedtime.   polyethylene glycol (MIRALAX / GLYCOLAX) 17 g packet Take 17 g by mouth daily as needed (Constipation).   senna-docusate (SENOKOT-S) 8.6-50 MG tablet Take 1 tablet by mouth at bedtime as needed for mild constipation.     Radiology:   No results found. MRI of the brain 10/02/2020: 1. Acute infarcts in the anterior and posterior right frontal lobe. Associated edema with sulcal effacement. Mild curvilinear susceptibility artifact the regions of infarct likely represent petechial hemorrhage. No mass occupying acute hemorrhage. 2. Focal susceptibility artifact within a right M2 MCA branch in the region of the more posterior frontal lobe infarct, compatible with thrombus and known occlusion better characterized on recent CTA. 3. Remote left thalamic and  left cerebellar lacunar infarcts. 4. Large right mastoid effusion.  CTA Chest 10/09/2020:  1. No pulmonary vascular malformation or acute intrathoracic abnormality. 2. Mild cardiomegaly.  Aortic Atherosclerosis  Cardiac Studies:   Echocardiogram 10/02/2020:  1. Left ventricular ejection fraction, by estimation, is 40 to 45%. The left ventricle has mildly decreased function. The left ventricle demonstrates global hypokinesis. There is mild concentric left ventricular hypertrophy. Left ventricular diastolic parameters are consistent with Grade I diastolic dysfunction (impaired relaxation). Elevated left atrial pressure.  2. Right ventricular systolic function is normal. The right ventricular size is normal.  3. The pericardial effusion is circumferential. There is no evidence of cardiac tamponade.  4. The mitral valve is normal in structure. Trivial mitral valve regurgitation. No evidence of mitral stenosis.  5. The aortic valve is normal in structure. Aortic valve regurgitation is trivial. No aortic stenosis is present.  6. Aortic dilatation noted. There is mild dilatation of the aortic root, measuring 44 mm. There is mild dilatation of the ascending aorta, measuring 42 mm.  7. The inferior vena cava is normal in size with greater than 50% respiratory variability, suggesting right atrial pressure of 3 mmHg.   Lower extremity venous duplex 10/03/2020: BILATERAL: - No evidence of deep vein thrombosis seen in the lower extremities, bilaterally. - No evidence of superficial venous thrombosis in the lower extremities, bilaterally. -No evidence of popliteal cyst, bilaterally.   TEE 10/09/2020: Preliminary, personally reviewed by me. LVEF appears to be mildly depressed, EF around 45% with global hypokinesis.  No significant valvular abnormality.  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   TCD  bubble study pulmonary 10/09/2020: A vascular evaluation was performed. The right middle cerebral artery was studied. An IV was inserted into the patient's right forearm . Verbal informed consent was obtained.  HITS heard at rest & valsalva: spencer grade 2 *See table(s) above for TCD measurements and observations.    EKG:   12/03/2020: Sinus tachycardia 108 bpm.  Left axis, left anterior fascicular block.  Incomplete right bundle branch block. LVH.   10/01/2020: Normal sinus rhythm at rate of 90 bpm, normal axis, LVH by Cain Saupe criteria.  Nonspecific T abnormality, cannot exclude lateral ischemia.  Normal QT interval.  No prior EKG to compare.  Abnormal EKG.  Assessment     ICD-10-CM   1. Essential hypertension  I10 EKG 12-Lead    Basic metabolic panel    2. Hypercholesteremia  E78.00     3. History of stroke - with residual left hemiplegia  Z86.73 LONG TERM MONITOR-LIVE TELEMETRY (3-14 DAYS)    4. Non-ischemic cardiomyopathy (HCC)  I42.8     5. Palpitations  R00.2 LONG TERM MONITOR-LIVE TELEMETRY (3-14 DAYS)       There are no discontinued medications.  Meds ordered this encounter  Medications   metoprolol succinate (TOPROL-XL) 50 MG 24 hr tablet    Sig: Take 1 tablet (50 mg total) by mouth daily. Take with or immediately following a meal.    Dispense:  90 tablet    Refill:  3   losartan (COZAAR) 25 MG tablet    Sig: Take 1 tablet (25 mg total) by mouth daily.    Dispense:  90 tablet    Refill:  3    Recommendations:   Travaughn LAVONE WEISEL is a 44 y.o. male with history of hypertension, hyperlipidemia, non-ischemic cardiomyopathy likely secondary to untreated hypertension. He presented to [facility] 10/09/2020 with new onset left arm and leg weakness where he received tPA for acte stroke. TEE found right to left shunting suggestive of pulmonary AV fistula, ASD/PFO could not be ruled out.   Patient was seen and evaluated by Dr Jacinto Halim in the hospital and recommended CTA of the  chest to further evaluate. CTA showed no pulmonary vascular malformation. As CTA was negative  was recommended to consider intracardiac echo guided therapy with possible repair, right heart cath, and 2 week cardiac monitor to evaluate for underlying atrial fibrillation.   Patient now presents for follow up accompanied by an aide from the assisted living facility in which she resides.  Patient is asymptomatic at this time and feeling relatively well.  He is tolerating present medications without issue. .  Blood pressure remains uncontrolled, will therefore add metoprolol succinate 50 mg once daily as well as losartan 25 mg once daily.  We will repeat BMP in 1 week.  In regard to embolic origin of stroke we will obtain 2-week cardiac monitor to evaluate for underlying atrial fibrillation.  I have also discussed the case with Dr. Jacinto Halim who recommends proceeding with PFO/ASD closure.  Briefly discussed with patient regarding indication, risks, and benefits of procedure to close PFO and ASD.  Patient is amenable to going forward with this.  We will discuss further at next visit following results of cardiac monitor.  At this time we will continue amlodipine, aspirin, atorvastatin, and Plavix.  Follow up in 3 weeks, sooner if needed, for ASD/PFO and results of cardiac monitor.   Patient was seen in collaboration with Dr. Jacinto Halim and he is in agreement with the plan. Marland Kitchen  Rayford HalstedCeleste C Olyvia Gopal, PA-C 12/05/2020, 7:30 PM Office: [phone]

## 2020-12-03 ENCOUNTER — Other Ambulatory Visit: Payer: Self-pay

## 2020-12-03 ENCOUNTER — Encounter: Payer: Self-pay | Admitting: Student

## 2020-12-03 ENCOUNTER — Inpatient Hospital Stay: Payer: Self-pay

## 2020-12-03 ENCOUNTER — Ambulatory Visit: Payer: Self-pay | Admitting: Student

## 2020-12-03 VITALS — BP 168/108 | HR 115 | Temp 97.0°F | Resp 16 | Ht 67.0 in | Wt 118.4 lb

## 2020-12-03 DIAGNOSIS — R002 Palpitations: Secondary | ICD-10-CM

## 2020-12-03 DIAGNOSIS — E7800 Pure hypercholesterolemia, unspecified: Secondary | ICD-10-CM

## 2020-12-03 DIAGNOSIS — I10 Essential (primary) hypertension: Secondary | ICD-10-CM

## 2020-12-03 DIAGNOSIS — Z8673 Personal history of transient ischemic attack (TIA), and cerebral infarction without residual deficits: Secondary | ICD-10-CM

## 2020-12-03 DIAGNOSIS — I428 Other cardiomyopathies: Secondary | ICD-10-CM

## 2020-12-03 MED ORDER — METOPROLOL SUCCINATE ER 50 MG PO TB24: 50.0000 mg | ORAL_TABLET | Freq: Every day | ORAL | 3 refills | Status: DC

## 2020-12-03 MED ORDER — LOSARTAN POTASSIUM 25 MG PO TABS: 25.0000 mg | ORAL_TABLET | Freq: Every day | ORAL | 3 refills | Status: DC

## 2020-12-21 NOTE — H&P (View-Only) (Signed)
Primary Physician/Referring:  Charlynne Pander, MD  Patient ID: Harry [name], male    DOB: 02/19/1976, 45 y.o.   MRN: [id number]  Chief Complaint  Patient presents with   Hypertension   Follow-up   HPI:    Harry [name]  is a 45 y.o. male with history of hypertension, hyperlipidemia, non-ischemic cardiomyopathy likely secondary to untreated hypertension. He presented to [facility] 10/09/2020 with new onset left arm and leg weakness where he received tPA for acte stroke. TEE found right to left shunting suggestive of ASD/PFO, CTA ruled out pulmonary vascular malformation.  Patient presents for 3-week follow-up for results of cardiac monitor.  At last visit patient was seen in collaboration with Dr. Jacinto Halim recommends proceeding with closure of PFO and ASD as well as right heart cath.  At last visit patient's blood pressure was uncontrolled therefore added metoprolol succinate 50 mg once daily losartan 25 mg once daily.  Patient's blood pressure is mildly elevated in the office today, however his nurse from the facility where he lives reports blood pressure averages 130/80 mmHg and BMP stable.    Past Medical History:  Diagnosis Date   Hypertension    Past Surgical History:  Procedure Laterality Date   BUBBLE STUDY  10/08/2020   Procedure: BUBBLE STUDY;  Surgeon: Quintella Reichert, MD;  Location: [facility] ENDOSCOPY;  Service: Cardiovascular;;   TEE WITHOUT CARDIOVERSION N/A 10/08/2020   Procedure: TRANSESOPHAGEAL ECHOCARDIOGRAM (TEE);  Surgeon: Quintella Reichert, MD;  Location: [facility] ENDOSCOPY;  Service: Cardiovascular;  Laterality: N/A;   Family History  Problem Relation Age of Onset   Cervical cancer Mother    Stroke Father    Heart Problems Sister        died from blood clot   Diabetes Brother     Social History   Tobacco Use   Smoking status: Never   Smokeless tobacco: Never  Substance Use Topics   Alcohol use: Never   Marital Status: Single   ROS  Review of Systems   Cardiovascular:  Negative for chest pain, leg swelling, near-syncope, orthopnea, palpitations and syncope.  Respiratory:  Negative for shortness of breath.    Objective  Blood pressure (!) 142/88, pulse 74, height 5\' 7"  (1.702 m), weight 111 lb (50.3 kg), SpO2 99 %.  Vitals with BMI 12/24/2020 12/03/2020 11/02/2020  Height 5\' 7"  5\' 7"  -  Weight 111 lbs 118 lbs 6 oz -  BMI 17.38 18.54 -  Systolic 142 168 932  Diastolic 88 108 75  Pulse 74 115 73      Physical Exam Vitals reviewed.  Constitutional:      General: He is not in acute distress.    Comments: In wheelchair  Cardiovascular:     Rate and Rhythm: Normal rate and regular rhythm.     Pulses: Intact distal pulses.     Heart sounds: S1 normal and S2 normal. No murmur heard.   No gallop.  Pulmonary:     Effort: Pulmonary effort is normal. No respiratory distress.     Breath sounds: No wheezing, rhonchi or rales.  Musculoskeletal:     Right lower leg: No edema.     Left lower leg: No edema.  Neurological:     Mental Status: He is alert.     Comments: Left hemiplegia noted     Laboratory examination:   Recent Labs    10/29/20 0453 10/31/20 0357 11/02/20 0455  NA 137 139 139  K 3.6 3.3* 3.4*  CL  103 105 106  CO2 27 27 26   GLUCOSE 93 91 97  BUN 8 14 13   CREATININE 0.62 0.65 0.73  CALCIUM 8.3* 8.0* 8.0*  GFRNONAA >60 >60 >60   CrCl cannot be calculated (Patient's most recent lab result is older than the maximum 21 days allowed.).  CMP Latest Ref Rng & Units 11/02/2020 10/31/2020 10/29/2020  Glucose 70 - 99 mg/dL 97 91 93  BUN 6 - 20 mg/dL 13 14 8   Creatinine 0.61 - 1.24 mg/dL 11/02/2020 10/31/2020  Sodium 135 - 145 mmol/L 139 139 137  Potassium 3.5 - 5.1 mmol/L 3.4(L) 3.3(L) 3.6  Chloride 98 - 111 mmol/L 106 105 103  CO2 22 - 32 mmol/L 26 27 27   Calcium 8.9 - 10.3 mg/dL 8.0(L) 8.0(L) 8.3(L)  Total Protein 6.5 - 8.1 g/dL - - -  Total Bilirubin 0.3 - 1.2 mg/dL - - -  Alkaline Phos 38 - 126 U/L - - -  AST 15 - 41 U/L -  - -  ALT 0 - 44 U/L - - -   CBC Latest Ref Rng & Units 11/02/2020 10/31/2020 10/29/2020  WBC 4.0 - 10.5 K/uL 5.2 4.7 5.9  Hemoglobin 13.0 - 17.0 g/dL ) 11/04/2020) 11/02/2020)  Hematocrit 39.0 - 52.0 % 26.5(L) 26.3(L) 27.5(L)  Platelets 150 - 400 K/uL 325 310 341    Lipid Panel Recent Labs    10/02/20 0607  CHOL 297*  TRIG 62  LDLCALC 212*  VLDL 12  HDL 73  CHOLHDL 4.1    HEMOGLOBIN A1C Lab Results  Component Value Date   HGBA1C 5.3 10/02/2020   MPG 105.41 10/02/2020   TSH Recent Labs    10/06/20 1648  TSH 1.055    External labs:  12/10/2020: Creatinine 0.62, GFR >90, sodium 140, potassium 4.0  Allergies  No Known Allergies    Medications Prior to Visit:   Outpatient Medications Prior to Visit  Medication Sig Dispense Refill   acetaminophen (TYLENOL) 325 MG tablet Take 2 tablets (650 mg total) by mouth every 4 (four) hours as needed for mild pain (or temp > 37.5 C (99.5 F)).     amLODipine (NORVASC) 10 MG tablet Take 1 tablet (10 mg total) by mouth daily.     aspirin 81 MG chewable tablet Chew 1 tablet (81 mg total) by mouth daily.     atorvastatin (LIPITOR) 80 MG tablet Take 1 tablet (80 mg total) by mouth daily.     citalopram (CELEXA) 20 MG tablet Take 1 tablet (20 mg total) by mouth daily.     losartan (COZAAR) 25 MG tablet Take 1 tablet (25 mg total) by mouth daily. 90 tablet 3   MAGNESIUM OXIDE 400 PO Take 1 tablet by mouth daily.     metoprolol succinate (TOPROL-XL) 50 MG 24 hr tablet Take 1 tablet (50 mg total) by mouth daily. Take with or immediately following a meal. 90 tablet 3   pantoprazole sodium (PROTONIX) 40 mg/20 mL PACK Take 20 mLs (40 mg total) by mouth at bedtime. 30 mL    polyethylene glycol (MIRALAX / GLYCOLAX) 17 g packet Take 17 g by mouth daily as needed (Constipation). 14 each 0   senna-docusate (SENOKOT-S) 8.6-50 MG tablet Take 1 tablet by mouth at bedtime as needed for mild constipation.     No facility-administered medications prior to  visit.     Final Medications at End of Visit    Current Meds  Medication Sig   acetaminophen (TYLENOL) 325 MG tablet  Take 2 tablets (650 mg total) by mouth every 4 (four) hours as needed for mild pain (or temp > 37.5 C (99.5 F)).   amLODipine (NORVASC) 10 MG tablet Take 1 tablet (10 mg total) by mouth daily.   aspirin 81 MG chewable tablet Chew 1 tablet (81 mg total) by mouth daily.   atorvastatin (LIPITOR) 80 MG tablet Take 1 tablet (80 mg total) by mouth daily.   citalopram (CELEXA) 20 MG tablet Take 1 tablet (20 mg total) by mouth daily.   losartan (COZAAR) 25 MG tablet Take 1 tablet (25 mg total) by mouth daily.   MAGNESIUM OXIDE 400 PO Take 1 tablet by mouth daily.   metoprolol succinate (TOPROL-XL) 50 MG 24 hr tablet Take 1 tablet (50 mg total) by mouth daily. Take with or immediately following a meal.   pantoprazole sodium (PROTONIX) 40 mg/20 mL PACK Take 20 mLs (40 mg total) by mouth at bedtime.   polyethylene glycol (MIRALAX / GLYCOLAX) 17 g packet Take 17 g by mouth daily as needed (Constipation).   senna-docusate (SENOKOT-S) 8.6-50 MG tablet Take 1 tablet by mouth at bedtime as needed for mild constipation.   Radiology:   No results found. MRI of the brain 10/02/2020: 1. Acute infarcts in the anterior and posterior right frontal lobe. Associated edema with sulcal effacement. Mild curvilinear susceptibility artifact the regions of infarct likely represent petechial hemorrhage. No mass occupying acute hemorrhage. 2. Focal susceptibility artifact within a right M2 MCA branch in the region of the more posterior frontal lobe infarct, compatible with thrombus and known occlusion better characterized on recent CTA. 3. 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 4. Large right mastoid effusion.  CTA Chest 10/09/2020:  1. No pulmonary vascular malformation or acute intrathoracic abnormality. 2. Mild cardiomegaly.  Aortic Atherosclerosis  Cardiac Studies:    Echocardiogram 10/02/2020:  1. Left ventricular ejection fraction, by estimation, is 40 to 45%. The left ventricle has mildly decreased function. The left ventricle demonstrates global hypokinesis. There is mild concentric left ventricular hypertrophy. Left ventricular diastolic parameters are consistent with Grade I diastolic dysfunction (impaired relaxation). Elevated left atrial pressure.  2. Right ventricular systolic function is normal. The right ventricular size is normal.  3. The pericardial effusion is circumferential. There is no evidence of cardiac tamponade.  4. The mitral valve is normal in structure. Trivial mitral valve regurgitation. No evidence of mitral stenosis.  5. The aortic valve is normal in structure. Aortic valve regurgitation is trivial. No aortic stenosis is present.  6. Aortic dilatation noted. There is mild dilatation of the aortic root, measuring 44 mm. There is mild dilatation of the ascending aorta, measuring 42 mm.  7. The inferior vena cava is normal in size with greater than 50% respiratory variability, suggesting right atrial pressure of 3 mmHg.   Lower extremity venous duplex 10/03/2020: BILATERAL: - No evidence of deep vein thrombosis seen in the lower extremities, bilaterally. - No evidence of superficial venous thrombosis in the lower extremities, bilaterally. -No evidence of popliteal cyst, bilaterally.   TEE 10/09/2020: Preliminary, personally reviewed by me. LVEF appears to be mildly depressed, EF around 45% with global hypokinesis.  No significant valvular abnormality.  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   TCD bubble study pulmonary 10/09/2020: A vascular evaluation was performed. The right middle cerebral artery was studied. An IV was inserted into the patient's right forearm .  Verbal informed consent was obtained.  HITS heard at rest &  valsalva: spencer grade 2 See table(s) above for TCD measurements and observations.    EKG:   12/03/2020: Sinus tachycardia 108 bpm.  Left axis, left anterior fascicular block.  Incomplete right bundle branch block. LVH.   10/01/2020: Normal sinus rhythm at rate of 90 bpm, normal axis, LVH by Cain Saupe criteria.  Nonspecific T abnormality, cannot exclude lateral ischemia.  Normal QT interval.  No prior EKG to compare.  Abnormal EKG.  Assessment     ICD-10-CM   1. Palpitations  R00.2     2. History of stroke - with residual left hemiplegia  Z86.73 Basic metabolic panel    CBC    3. Essential hypertension  I10        There are no discontinued medications.  No orders of the defined types were placed in this encounter.  Recommendations:   Harry [name] is a 44 y.o. male with history of hypertension, hyperlipidemia, non-ischemic cardiomyopathy likely secondary to untreated hypertension. He presented to [facility] 10/09/2020 with new onset left arm and leg weakness where he received tPA for acte stroke. TEE found right to left shunting suggestive of ASD/PFO, CTA rule out pulmonary vascular malformation.  Patient presents for 3-week follow-up for results of cardiac monitor.  At last visit patient was seen in collaboration with Dr. Jacinto Halim recommends proceeding with closure of PFO and ASD as well as right heart cath.  At last visit patient's blood pressure was uncontrolled therefore added metoprolol succinate 50 mg once daily losartan 25 mg once daily.  Blood pressure is now well controlled and repeat BMP remained stable.  Amatory cardiac telemetry results are pending, will therefore call patient's mother with results of cardiac monitor when available.  In regard to patient's hypertension, blood pressure is mildly elevated in the office today, however home blood pressure readings from his nurse at his facility are well controlled.  Given patient's history of stroke monitor will be used to evaluate  for underlying cardiac arrhythmia as potential etiology.  Also given PFO/ASD Dr. Jacinto Halim has recommended closure.  Reviewed and discussed indication, risk, benefits of ASD/PFO closure with patient, patient facility representative, and patient's brother.  All questions were addressed and patient verbalized understanding of risks and benefits.  Patient wishes to proceed with closure procedure.  Will not make changes to his medications at this time.  Follow-up in 3 months, sooner if needed, for hypertension, hyperlipidemia, cardiomyopathy.   Rayford Halsted, PA-C 12/24/2020, 11:58 AM Office: [phone]

## 2020-12-21 NOTE — Progress Notes (Signed)
 Primary Physician/Referring:  Blass, Joel, MD  Patient ID: Harry [name], male    DOB: 02/18/1976, 45 y.o.   MRN: [id number]  Chief Complaint  Patient presents with   Hypertension   Follow-up   HPI:    Harry [name]  is a 45 y.o. male with history of hypertension, hyperlipidemia, non-ischemic cardiomyopathy likely secondary to untreated hypertension. He presented to [place] 10/09/2020 with new onset left arm and leg weakness where he received tPA for acte stroke. TEE found right to left shunting suggestive of ASD/PFO, CTA ruled out pulmonary vascular malformation.  Patient presents for 3-week follow-up for results of cardiac monitor.  At last visit patient was seen in collaboration with Dr. Ganji recommends proceeding with closure of PFO and ASD as well as right heart cath.  At last visit patient's blood pressure was uncontrolled therefore added metoprolol succinate 50 mg once daily losartan 25 mg once daily.  Patient's blood pressure is mildly elevated in the office today, however his nurse from the facility where he lives reports blood pressure averages 130/80 mmHg and BMP stable.    Past Medical History:  Diagnosis Date   Hypertension    Past Surgical History:  Procedure Laterality Date   BUBBLE STUDY  10/08/2020   Procedure: BUBBLE STUDY;  Surgeon: Turner, Traci R, MD;  Location: [facility];  Service: Cardiovascular;;   TEE WITHOUT CARDIOVERSION N/A 10/08/2020   Procedure: TRANSESOPHAGEAL ECHOCARDIOGRAM (TEE);  Surgeon: Turner, Traci R, MD;  Location: [facility];  Service: Cardiovascular;  Laterality: N/A;   Family History  Problem Relation Age of Onset   Cervical cancer Mother    Stroke Father    Heart Problems Sister        died from blood clot   Diabetes Brother     Social History   Tobacco Use   Smoking status: Never   Smokeless tobacco: Never  Substance Use Topics   Alcohol use: Never   Marital Status: Single   ROS  Review of Systems   Cardiovascular:  Negative for chest pain, leg swelling, near-syncope, orthopnea, palpitations and syncope.  Respiratory:  Negative for shortness of breath.    Objective  Blood pressure (!) 142/88, pulse 74, height 5' 7" (1.702 m), weight 111 lb (50.3 kg), SpO2 99 %.  Vitals with BMI 12/24/2020 12/03/2020 11/02/2020  Height 5' 7" 5' 7" -  Weight 111 lbs 118 lbs 6 oz -  BMI 17.38 18.54 -  Systolic 142 168 125  Diastolic 88 108 75  Pulse 74 115 73      Physical Exam Vitals reviewed.  Constitutional:      General: He is not in acute distress.    Comments: In wheelchair  Cardiovascular:     Rate and Rhythm: Normal rate and regular rhythm.     Pulses: Intact distal pulses.     Heart sounds: S1 normal and S2 normal. No murmur heard.   No gallop.  Pulmonary:     Effort: Pulmonary effort is normal. No respiratory distress.     Breath sounds: No wheezing, rhonchi or rales.  Musculoskeletal:     Right lower leg: No edema.     Left lower leg: No edema.  Neurological:     Mental Status: He is alert.     Comments: Left hemiplegia noted     Laboratory examination:   Recent Labs    10/29/20 0453 10/31/20 0357 11/02/20 0455  NA 137 139 139  K 3.6 3.3* 3.4*  CL   103 105 106  CO2 27 27 26   GLUCOSE 93 91 97  BUN 8 14 13   CREATININE 0.62 0.65 0.73  CALCIUM 8.3* 8.0* 8.0*  GFRNONAA >60 >60 >60   CrCl cannot be calculated (Patient's most recent lab result is older than the maximum 21 days allowed.).  CMP Latest Ref Rng & Units 11/02/2020 10/31/2020 10/29/2020  Glucose 70 - 99 mg/dL 97 91 93  BUN 6 - 20 mg/dL 13 14 8   Creatinine 0.61 - 1.24 mg/dL 11/02/2020 10/31/2020  Sodium 135 - 145 mmol/L 139 139 137  Potassium 3.5 - 5.1 mmol/L 3.4(L) 3.3(L) 3.6  Chloride 98 - 111 mmol/L 106 105 103  CO2 22 - 32 mmol/L 26 27 27   Calcium 8.9 - 10.3 mg/dL 8.0(L) 8.0(L) 8.3(L)  Total Protein 6.5 - 8.1 g/dL - - -  Total Bilirubin 0.3 - 1.2 mg/dL - - -  Alkaline Phos 38 - 126 U/L - - -  AST 15 - 41 U/L -  - -  ALT 0 - 44 U/L - - -   CBC Latest Ref Rng & Units 11/02/2020 10/31/2020 10/29/2020  WBC 4.0 - 10.5 K/uL 5.2 4.7 5.9  Hemoglobin 13.0 - 17.0 g/dL ) 11/04/2020) 11/02/2020)  Hematocrit 39.0 - 52.0 % 26.5(L) 26.3(L) 27.5(L)  Platelets 150 - 400 K/uL 325 310 341    Lipid Panel Recent Labs    10/02/20 0607  CHOL 297*  TRIG 62  LDLCALC 212*  VLDL 12  HDL 73  CHOLHDL 4.1    HEMOGLOBIN A1C Lab Results  Component Value Date   HGBA1C 5.3 10/02/2020   MPG 105.41 10/02/2020   TSH Recent Labs    10/06/20 1648  TSH 1.055    External labs:  12/10/2020: Creatinine 0.62, GFR >90, sodium 140, potassium 4.0  Allergies  No Known Allergies    Medications Prior to Visit:   Outpatient Medications Prior to Visit  Medication Sig Dispense Refill   acetaminophen (TYLENOL) 325 MG tablet Take 2 tablets (650 mg total) by mouth every 4 (four) hours as needed for mild pain (or temp > 37.5 C (99.5 F)).     amLODipine (NORVASC) 10 MG tablet Take 1 tablet (10 mg total) by mouth daily.     aspirin 81 MG chewable tablet Chew 1 tablet (81 mg total) by mouth daily.     atorvastatin (LIPITOR) 80 MG tablet Take 1 tablet (80 mg total) by mouth daily.     citalopram (CELEXA) 20 MG tablet Take 1 tablet (20 mg total) by mouth daily.     losartan (COZAAR) 25 MG tablet Take 1 tablet (25 mg total) by mouth daily. 90 tablet 3   MAGNESIUM OXIDE 400 PO Take 1 tablet by mouth daily.     metoprolol succinate (TOPROL-XL) 50 MG 24 hr tablet Take 1 tablet (50 mg total) by mouth daily. Take with or immediately following a meal. 90 tablet 3   pantoprazole sodium (PROTONIX) 40 mg/20 mL PACK Take 20 mLs (40 mg total) by mouth at bedtime. 30 mL    polyethylene glycol (MIRALAX / GLYCOLAX) 17 g packet Take 17 g by mouth daily as needed (Constipation). 14 each 0   senna-docusate (SENOKOT-S) 8.6-50 MG tablet Take 1 tablet by mouth at bedtime as needed for mild constipation.     No facility-administered medications prior to  visit.     Final Medications at End of Visit    Current Meds  Medication Sig   acetaminophen (TYLENOL) 325 MG tablet  Take 2 tablets (650 mg total) by mouth every 4 (four) hours as needed for mild pain (or temp > 37.5 C (99.5 F)).   amLODipine (NORVASC) 10 MG tablet Take 1 tablet (10 mg total) by mouth daily.   aspirin 81 MG chewable tablet Chew 1 tablet (81 mg total) by mouth daily.   atorvastatin (LIPITOR) 80 MG tablet Take 1 tablet (80 mg total) by mouth daily.   citalopram (CELEXA) 20 MG tablet Take 1 tablet (20 mg total) by mouth daily.   losartan (COZAAR) 25 MG tablet Take 1 tablet (25 mg total) by mouth daily.   MAGNESIUM OXIDE 400 PO Take 1 tablet by mouth daily.   metoprolol succinate (TOPROL-XL) 50 MG 24 hr tablet Take 1 tablet (50 mg total) by mouth daily. Take with or immediately following a meal.   pantoprazole sodium (PROTONIX) 40 mg/20 mL PACK Take 20 mLs (40 mg total) by mouth at bedtime.   polyethylene glycol (MIRALAX / GLYCOLAX) 17 g packet Take 17 g by mouth daily as needed (Constipation).   senna-docusate (SENOKOT-S) 8.6-50 MG tablet Take 1 tablet by mouth at bedtime as needed for mild constipation.   Radiology:   No results found. MRI of the brain 10/02/2020: 1. Acute infarcts in the anterior and posterior right frontal lobe. Associated edema with sulcal effacement. Mild curvilinear susceptibility artifact the regions of infarct likely represent petechial hemorrhage. No mass occupying acute hemorrhage. 2. Focal susceptibility artifact within a right M2 MCA branch in the region of the more posterior frontal lobe infarct, compatible with thrombus and known occlusion better characterized on recent CTA. 3. 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 4. Large right mastoid effusion.  CTA Chest 10/09/2020:  1. No pulmonary vascular malformation or acute intrathoracic abnormality. 2. Mild cardiomegaly.  Aortic Atherosclerosis  Cardiac Studies:    Echocardiogram 10/02/2020:  1. Left ventricular ejection fraction, by estimation, is 40 to 45%. The left ventricle has mildly decreased function. The left ventricle demonstrates global hypokinesis. There is mild concentric left ventricular hypertrophy. Left ventricular diastolic parameters are consistent with Grade I diastolic dysfunction (impaired relaxation). Elevated left atrial pressure.  2. Right ventricular systolic function is normal. The right ventricular size is normal.  3. The pericardial effusion is circumferential. There is no evidence of cardiac tamponade.  4. The mitral valve is normal in structure. Trivial mitral valve regurgitation. No evidence of mitral stenosis.  5. The aortic valve is normal in structure. Aortic valve regurgitation is trivial. No aortic stenosis is present.  6. Aortic dilatation noted. There is mild dilatation of the aortic root, measuring 44 mm. There is mild dilatation of the ascending aorta, measuring 42 mm.  7. The inferior vena cava is normal in size with greater than 50% respiratory variability, suggesting right atrial pressure of 3 mmHg.   Lower extremity venous duplex 10/03/2020: BILATERAL: - No evidence of deep vein thrombosis seen in the lower extremities, bilaterally. - No evidence of superficial venous thrombosis in the lower extremities, bilaterally. -No evidence of popliteal cyst, bilaterally.   TEE 10/09/2020: Preliminary, personally reviewed by me. LVEF appears to be mildly depressed, EF around 45% with global hypokinesis.  No significant valvular abnormality.  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   TCD bubble study pulmonary 10/09/2020: A vascular evaluation was performed. The right middle cerebral artery was studied. An IV was inserted into the patient's right forearm .  Verbal informed consent was obtained.  HITS heard at rest &  valsalva: spencer grade 2 See table(s) above for TCD measurements and observations.    EKG:   12/03/2020: Sinus tachycardia 108 bpm.  Left axis, left anterior fascicular block.  Incomplete right bundle branch block. LVH.   10/01/2020: Normal sinus rhythm at rate of 90 bpm, normal axis, LVH by Cain Saupe criteria.  Nonspecific T abnormality, cannot exclude lateral ischemia.  Normal QT interval.  No prior EKG to compare.  Abnormal EKG.  Assessment     ICD-10-CM   1. Palpitations  R00.2     2. History of stroke - with residual left hemiplegia  Z86.73 Basic metabolic panel    CBC    3. Essential hypertension  I10        There are no discontinued medications.  No orders of the defined types were placed in this encounter.  Recommendations:   Harry [name] is a 44 y.o. male with history of hypertension, hyperlipidemia, non-ischemic cardiomyopathy likely secondary to untreated hypertension. He presented to [facility] 10/09/2020 with new onset left arm and leg weakness where he received tPA for acte stroke. TEE found right to left shunting suggestive of ASD/PFO, CTA rule out pulmonary vascular malformation.  Patient presents for 3-week follow-up for results of cardiac monitor.  At last visit patient was seen in collaboration with Dr. Jacinto Halim recommends proceeding with closure of PFO and ASD as well as right heart cath.  At last visit patient's blood pressure was uncontrolled therefore added metoprolol succinate 50 mg once daily losartan 25 mg once daily.  Blood pressure is now well controlled and repeat BMP remained stable.  Amatory cardiac telemetry results are pending, will therefore call patient's mother with results of cardiac monitor when available.  In regard to patient's hypertension, blood pressure is mildly elevated in the office today, however home blood pressure readings from his nurse at his facility are well controlled.  Given patient's history of stroke monitor will be used to evaluate  for underlying cardiac arrhythmia as potential etiology.  Also given PFO/ASD Dr. Jacinto Halim has recommended closure.  Reviewed and discussed indication, risk, benefits of ASD/PFO closure with patient, patient facility representative, and patient's brother.  All questions were addressed and patient verbalized understanding of risks and benefits.  Patient wishes to proceed with closure procedure.  Will not make changes to his medications at this time.  Follow-up in 3 months, sooner if needed, for hypertension, hyperlipidemia, cardiomyopathy.   Rayford Halsted, PA-C 12/24/2020, 11:58 AM Office: [phone]

## 2020-12-24 ENCOUNTER — Ambulatory Visit: Payer: Medicaid Other | Admitting: Student

## 2020-12-24 ENCOUNTER — Encounter: Payer: Self-pay | Admitting: Student

## 2020-12-24 ENCOUNTER — Other Ambulatory Visit: Payer: Self-pay

## 2020-12-24 VITALS — BP 142/88 | HR 74 | Ht 67.0 in | Wt 111.0 lb

## 2020-12-24 DIAGNOSIS — Z8673 Personal history of transient ischemic attack (TIA), and cerebral infarction without residual deficits: Secondary | ICD-10-CM

## 2020-12-24 DIAGNOSIS — R002 Palpitations: Secondary | ICD-10-CM

## 2020-12-24 DIAGNOSIS — I10 Essential (primary) hypertension: Secondary | ICD-10-CM

## 2021-01-07 NOTE — Progress Notes (Signed)
Please call patient's brother with results. No significant cardiac arrhythmias, no a fib. Single brief episode of fast heart rate, will discuss further at next office visit.

## 2021-01-07 NOTE — Progress Notes (Signed)
Called pt brother to inform him about the monitor results. Pt brother understood

## 2021-01-18 ENCOUNTER — Ambulatory Visit: Payer: Medicaid Other | Admitting: Cardiology

## 2021-01-19 LAB — BASIC METABOLIC PANEL
BUN/Creatinine Ratio: 21 — ABNORMAL HIGH (ref 9–20)
BUN: 16 mg/dL (ref 6–24)
CO2: 25 mmol/L (ref 20–29)
Calcium: 8.4 mg/dL — ABNORMAL LOW (ref 8.7–10.2)
Chloride: 102 mmol/L (ref 96–106)
Creatinine, Ser: 0.78 mg/dL (ref 0.76–1.27)
Glucose: 99 mg/dL (ref 65–99)
Potassium: 3.6 mmol/L (ref 3.5–5.2)
Sodium: 140 mmol/L (ref 134–144)
eGFR: 111 mL/min/{1.73_m2} (ref >59)

## 2021-01-19 LAB — CBC
Hematocrit: 34.1 % — ABNORMAL LOW (ref 37.5–51.0)
Hemoglobin: 11.4 g/dL — ABNORMAL LOW (ref 13.0–17.7)
MCH: 28.4 pg (ref 26.6–33.0)
MCHC: 33.4 g/dL (ref 31.5–35.7)
MCV: 85 fL (ref 79–97)
Platelets: 332 10*3/uL (ref 150–450)
RBC: 4.01 x10E6/uL — ABNORMAL LOW (ref 4.14–5.80)
RDW: 14 % (ref 11.6–15.4)
WBC: 6.9 10*3/uL (ref 3.4–10.8)

## 2021-01-21 DIAGNOSIS — Q2112 Patent foramen ovale: Secondary | ICD-10-CM

## 2021-01-21 DIAGNOSIS — Q211 Atrial septal defect: Secondary | ICD-10-CM

## 2021-01-22 ENCOUNTER — Encounter (HOSPITAL_COMMUNITY)
Admission: RE | Disposition: A | Discharge status: Home / Self Care | Payer: Self-pay | Source: Home / Self Care | Attending: Cardiology

## 2021-01-22 ENCOUNTER — Ambulatory Visit (HOSPITAL_COMMUNITY): Payer: Medicaid Other

## 2021-01-22 ENCOUNTER — Other Ambulatory Visit: Payer: Self-pay

## 2021-01-22 ENCOUNTER — Ambulatory Visit (HOSPITAL_COMMUNITY)
Admission: RE | Admit: 2021-01-22 | Discharge: 2021-01-22 | Disposition: A | Discharge status: Home / Self Care | Payer: Medicaid Other | Attending: Cardiology | Admitting: Cardiology

## 2021-01-22 DIAGNOSIS — I10 Essential (primary) hypertension: Secondary | ICD-10-CM | POA: Diagnosis not present

## 2021-01-22 DIAGNOSIS — Z79899 Other long term (current) drug therapy: Secondary | ICD-10-CM | POA: Insufficient documentation

## 2021-01-22 DIAGNOSIS — Q211 Atrial septal defect: Secondary | ICD-10-CM | POA: Insufficient documentation

## 2021-01-22 DIAGNOSIS — I428 Other cardiomyopathies: Secondary | ICD-10-CM | POA: Insufficient documentation

## 2021-01-22 DIAGNOSIS — Q2112 Patent foramen ovale: Secondary | ICD-10-CM

## 2021-01-22 DIAGNOSIS — Z7982 Long term (current) use of aspirin: Secondary | ICD-10-CM | POA: Diagnosis not present

## 2021-01-22 DIAGNOSIS — E785 Hyperlipidemia, unspecified: Secondary | ICD-10-CM | POA: Insufficient documentation

## 2021-01-22 DIAGNOSIS — I639 Cerebral infarction, unspecified: Secondary | ICD-10-CM | POA: Diagnosis present

## 2021-01-22 HISTORY — PX: PATENT FORAMEN OVALE(PFO) CLOSURE: CATH118300

## 2021-01-22 HISTORY — PX: RIGHT HEART CATH: CATH118263

## 2021-01-22 LAB — POCT I-STAT EG7
Acid-Base Excess: 1 mmol/L (ref 0.0–2.0)
Acid-Base Excess: 1 mmol/L (ref 0.0–2.0)
Bicarbonate: 27 mmol/L (ref 20.0–28.0)
Bicarbonate: 27.1 mmol/L (ref 20.0–28.0)
Calcium, Ion: 1.23 mmol/L (ref 1.15–1.40)
Calcium, Ion: 1.24 mmol/L (ref 1.15–1.40)
HCT: 25 % — ABNORMAL LOW (ref 39.0–52.0)
HCT: 26 % — ABNORMAL LOW (ref 39.0–52.0)
Hemoglobin: 8.5 g/dL — ABNORMAL LOW (ref 13.0–17.0)
Hemoglobin: 8.8 g/dL — ABNORMAL LOW (ref 13.0–17.0)
O2 Saturation: 82 %
O2 Saturation: 84 %
Potassium: 3.4 mmol/L — ABNORMAL LOW (ref 3.5–5.1)
Potassium: 3.4 mmol/L — ABNORMAL LOW (ref 3.5–5.1)
Sodium: 141 mmol/L (ref 135–145)
Sodium: 141 mmol/L (ref 135–145)
TCO2: 29 mmol/L (ref 22–32)
TCO2: 29 mmol/L (ref 22–32)
pCO2, Ven: 51.9 mmHg (ref 44.0–60.0)
pCO2, Ven: 52.5 mmHg (ref 44.0–60.0)
pH, Ven: 7.321 (ref 7.250–7.430)
pH, Ven: 7.324 (ref 7.250–7.430)
pO2, Ven: 51 mmHg — ABNORMAL HIGH (ref 32.0–45.0)
pO2, Ven: 54 mmHg — ABNORMAL HIGH (ref 32.0–45.0)

## 2021-01-22 LAB — ECHOCARDIOGRAM LIMITED
Height: 67 in
Weight: 1872 oz

## 2021-01-22 LAB — POCT ACTIVATED CLOTTING TIME
Activated Clotting Time: 225 seconds
Activated Clotting Time: 248 seconds

## 2021-01-22 SURGERY — PATENT FORAMEN OVALE (PFO) CLOSURE
Anesthesia: LOCAL

## 2021-01-22 MED ORDER — ASPIRIN 81 MG PO CHEW: 81.0000 mg | CHEWABLE_TABLET | ORAL | Status: DC

## 2021-01-22 MED ORDER — SODIUM CHLORIDE 0.9 % WEIGHT BASED INFUSION
3.0000 mL/kg/h | INTRAVENOUS | Status: AC
  Administered 2021-01-22: 3 mL/kg/h via INTRAVENOUS

## 2021-01-22 MED ORDER — SODIUM CHLORIDE 0.9% FLUSH: 3.0000 mL | INTRAVENOUS | Status: DC | PRN

## 2021-01-22 MED ORDER — MIDAZOLAM HCL 2 MG/2ML IJ SOLN
INTRAMUSCULAR | Status: AC
  Filled 2021-01-22: qty 2

## 2021-01-22 MED ORDER — HEPARIN SODIUM (PORCINE) 1000 UNIT/ML IJ SOLN
INTRAMUSCULAR | Status: AC
  Filled 2021-01-22: qty 1

## 2021-01-22 MED ORDER — SODIUM CHLORIDE 0.9 % IV SOLN: 250.0000 mL | INTRAVENOUS | Status: DC | PRN

## 2021-01-22 MED ORDER — ASPIRIN 81 MG PO CHEW
81.0000 mg | CHEWABLE_TABLET | ORAL | Status: AC
  Administered 2021-01-22: 81 mg via ORAL
  Filled 2021-01-22: qty 1

## 2021-01-22 MED ORDER — CLOPIDOGREL BISULFATE 75 MG PO TABS
75.0000 mg | ORAL_TABLET | ORAL | Status: AC
  Administered 2021-01-22: 75 mg via ORAL
  Filled 2021-01-22: qty 1

## 2021-01-22 MED ORDER — SODIUM CHLORIDE 0.9% FLUSH: 3.0000 mL | Freq: Two times a day (BID) | INTRAVENOUS | Status: DC

## 2021-01-22 MED ORDER — HEPARIN (PORCINE) IN NACL 1000-0.9 UT/500ML-% IV SOLN
INTRAVENOUS | Status: AC
  Filled 2021-01-22: qty 500

## 2021-01-22 MED ORDER — SODIUM CHLORIDE 0.9 % WEIGHT BASED INFUSION
3.0000 mL/kg/h | INTRAVENOUS | Status: DC
Start: 2021-01-23 — End: 2021-01-22

## 2021-01-22 MED ORDER — MIDAZOLAM HCL 2 MG/2ML IJ SOLN
INTRAMUSCULAR | Status: DC | PRN
  Administered 2021-01-22: 2 mg via INTRAVENOUS

## 2021-01-22 MED ORDER — FENTANYL CITRATE (PF) 100 MCG/2ML IJ SOLN
INTRAMUSCULAR | Status: DC | PRN
  Administered 2021-01-22: 25 ug via INTRAVENOUS

## 2021-01-22 MED ORDER — LIDOCAINE HCL (PF) 1 % IJ SOLN
INTRAMUSCULAR | Status: DC | PRN
  Administered 2021-01-22: 12 mL

## 2021-01-22 MED ORDER — HEPARIN (PORCINE) IN NACL 1000-0.9 UT/500ML-% IV SOLN
INTRAVENOUS | Status: DC | PRN
  Administered 2021-01-22: 500 mL

## 2021-01-22 MED ORDER — SODIUM CHLORIDE 0.9 % WEIGHT BASED INFUSION: 1.0000 mL/kg/h | INTRAVENOUS | Status: DC

## 2021-01-22 MED ORDER — LIDOCAINE HCL (PF) 1 % IJ SOLN
INTRAMUSCULAR | Status: AC
  Filled 2021-01-22: qty 30

## 2021-01-22 MED ORDER — HEPARIN (PORCINE) IN NACL 1000-0.9 UT/500ML-% IV SOLN
INTRAVENOUS | Status: DC | PRN
Start: 2021-01-22 — End: 2021-01-22
  Administered 2021-01-22 (×2): 500 mL

## 2021-01-22 MED ORDER — IOHEXOL 350 MG/ML SOLN
INTRAVENOUS | Status: DC | PRN
  Administered 2021-01-22: 10 mL

## 2021-01-22 MED ORDER — HEPARIN SODIUM (PORCINE) 1000 UNIT/ML IJ SOLN
INTRAMUSCULAR | Status: DC | PRN
  Administered 2021-01-22: 3000 [IU] via INTRAVENOUS
  Administered 2021-01-22: 8000 [IU] via INTRAVENOUS
  Administered 2021-01-22: 2000 [IU] via INTRAVENOUS

## 2021-01-22 MED ORDER — CEFAZOLIN SODIUM-DEXTROSE 2-4 GM/100ML-% IV SOLN
INTRAVENOUS | Status: AC
  Filled 2021-01-22: qty 100

## 2021-01-22 MED ORDER — ONDANSETRON HCL 4 MG/2ML IJ SOLN: 4.0000 mg | Freq: Four times a day (QID) | INTRAMUSCULAR | Status: DC | PRN

## 2021-01-22 MED ORDER — FENTANYL CITRATE (PF) 100 MCG/2ML IJ SOLN
INTRAMUSCULAR | Status: AC
  Filled 2021-01-22: qty 2

## 2021-01-22 MED ORDER — CEFAZOLIN SODIUM-DEXTROSE 2-4 GM/100ML-% IV SOLN
2.0000 g | INTRAVENOUS | Status: AC
  Administered 2021-01-22: 2 g via INTRAVENOUS

## 2021-01-22 MED ORDER — ACETAMINOPHEN 325 MG PO TABS: 650.0000 mg | ORAL_TABLET | ORAL | Status: DC | PRN

## 2021-01-22 SURGICAL SUPPLY — 18 items
BALLN SIZING AMPLATZER 24 (BALLOONS) ×2
BALLOON SIZING AMPLATZER 24 (BALLOONS) IMPLANT
BALN SZ 70X4.5 7FR 3 LUM (BALLOONS) ×1
CATH 8FR ACUNAV REPROCESSED (CATHETERS) IMPLANT
CATH EXPO 5F MPA-1 (CATHETERS) ×1 IMPLANT
CATH REPROCESSED 8FR ACUNAV (CATHETERS) ×2 IMPLANT
CATH SWAN GANZ 7F STRAIGHT (CATHETERS) ×1 IMPLANT
COVER SWIFTLINK CONNECTOR (BAG) ×1 IMPLANT
GUIDEWIRE AMPLATZER 1.5JX260 (WIRE) ×1 IMPLANT
KIT MICROPUNCTURE NIT STIFF (SHEATH) ×1 IMPLANT
OCCLUDER AMPLATZER SEPTAL 10MM (Prosthesis & Implant Heart) ×1 IMPLANT
PACK CARDIAC CATHETERIZATION (CUSTOM PROCEDURE TRAY) ×2 IMPLANT
SHEATH INTROD W/O MIN 9FR 25CM (SHEATH) ×1 IMPLANT
SHEATH PINNACLE 8F 10CM (SHEATH) ×1 IMPLANT
SHEATH PROBE COVER 6X72 (BAG) ×1 IMPLANT
SYS DELIVER AMP TREVISIO 8FR (SHEATH) ×2
SYSTEM DELIVER AMP TREVIS 8FR (SHEATH) IMPLANT
WIRE EMERALD 3MM-J .035X150CM (WIRE) ×1 IMPLANT

## 2021-01-22 NOTE — Interval H&P Note (Signed)
History and Physical Interval Note:  01/22/2021 11:09 AM  Harry [name]  has presented today for surgery, with the diagnosis of pfo.  The various methods of treatment have been discussed with the patient and family. After consideration of risks, benefits and other options for treatment, the patient has consented to  Procedure(s): PATENT FORAMEN OVALE (PFO) CLOSURE (N/A) as a surgical intervention.  The patient's history has been reviewed, patient examined, no change in status, stable for surgery.  I have reviewed the patient's chart and labs.  Questions were answered to the patient's satisfaction.     Yates Decamp

## 2021-01-22 NOTE — Progress Notes (Signed)
Spoke with nurse Lauris Poag from facility and gave report on pt care post PFO closure. Verbalized understanding, no questions.  Aggie Cosier called to arrange transportation and will have transport back to the facility here at [place] for D/C.

## 2021-01-22 NOTE — CV Procedure (Signed)
Site Area: Right femoral vein Site Prior To Removal: Level 0 Pressure Applied for: 20 MINS Patient Status During Pull: STABLE  Post Pull Site: Level 0 Post Pull instructions Given: YES Post Pull pulses Present: PALPABLE DP Dressing Applied: YES Bed Rest Begins At: 15:35 Comments: 8 AND 9 FRENCH SHEATH PULLED FROM PATIENTS RIGHT FEMORAL VEIN. PATIENT TOLERATED WELL AND POST CARE INSTRUCTIONS WERE GIVEN AND DRESSING APPLIED.

## 2021-01-22 NOTE — Progress Notes (Signed)
  Echocardiogram 2D Echocardiogram has been performed.  Daisuke, Bailey M 01/22/2021, 1:21 PM

## 2021-01-23 ENCOUNTER — Encounter (HOSPITAL_COMMUNITY): Payer: Self-pay | Admitting: Cardiology

## 2021-01-24 ENCOUNTER — Encounter: Payer: Self-pay | Admitting: Cardiology

## 2021-01-24 ENCOUNTER — Other Ambulatory Visit: Payer: Self-pay

## 2021-01-24 ENCOUNTER — Ambulatory Visit: Payer: Medicaid Other | Admitting: Cardiology

## 2021-01-24 VITALS — BP 128/79 | HR 71 | Temp 98.6°F | Resp 17 | Ht 67.0 in | Wt 117.0 lb

## 2021-01-24 DIAGNOSIS — I77810 Thoracic aortic ectasia: Secondary | ICD-10-CM

## 2021-01-24 DIAGNOSIS — Z8774 Personal history of (corrected) congenital malformations of heart and circulatory system: Secondary | ICD-10-CM

## 2021-01-24 DIAGNOSIS — I10 Essential (primary) hypertension: Secondary | ICD-10-CM

## 2021-01-24 NOTE — Progress Notes (Signed)
Primary Physician/Referring:  Charlynne PanderBlass, Joel, MD  Patient ID: Harry [name], male    DOB: 06/10/1976, 45 y.o.   MRN: [id number]  Chief Complaint  Patient presents with   pfo   Hospitalization Follow-up   HPI:    Harry [name]  is a 45 y.o. male with history of hypertension, hyperlipidemia, non-ischemic cardiomyopathy likely secondary to untreated hypertension. He presented to [facility] 10/09/2020 with new onset left arm and leg weakness where he received tPA for acte stroke. TEE found right to left shunting suggestive of ASD/PFO, CTA ruled out pulmonary vascular malformation. He underwent intracardiac echo guided repair of the atrial septal defect with implantation of a 10 mm ASO Amplatzer device on 01/22/2021.  Patient presents for follow-up of after ASD closure.  Patient is feeling well since the procedure without specific complaints today.  Denies chest pain, palpitations, dyspnea, syncope, near syncope.  Denies orthopnea, PND, leg swelling.  Patient's blood pressure remains well controlled.  He states his energy is significantly improved, still primarily using a wheelchair but able to walk intermittently more than previously.  She is accompanied by nurse aide from long-term care facility where he lives.  Past Medical History:  Diagnosis Date   Hypertension    PFO (patent foramen ovale)    Past Surgical History:  Procedure Laterality Date   BUBBLE STUDY  10/08/2020   Procedure: BUBBLE STUDY;  Surgeon: Quintella Reicherturner, Traci R, MD;  Location: [facility] ENDOSCOPY;  Service: Cardiovascular;;   PATENT FORAMEN OVALE(PFO) CLOSURE N/A 01/22/2021   Procedure: PATENT FORAMEN OVALE (PFO) CLOSURE;  Surgeon: Yates DecampGanji, Jay, MD;  Location: [facility] INVASIVE CV LAB;  Service: Cardiovascular;  Laterality: N/A;   RIGHT HEART CATH N/A 01/22/2021   Procedure: RIGHT HEART CATH;  Surgeon: Yates DecampGanji, Jay, MD;  [facility];  Service: Cardiovascular;  Laterality: N/A;   TEE WITHOUT CARDIOVERSION N/A 10/08/2020    Procedure: TRANSESOPHAGEAL ECHOCARDIOGRAM (TEE);  Surgeon: Quintella Reicherturner, Traci R, MD;  [facility];  Service: Cardiovascular;  Laterality: N/A;   Family History  Problem Relation Age of Onset   Cervical cancer Mother    Stroke Father    Heart Problems Sister        died from blood clot   Diabetes Brother     Social History   Tobacco Use   Smoking status: Never   Smokeless tobacco: Never  Substance Use Topics   Alcohol use: Never   Marital Status: Single   ROS  Review of Systems  Cardiovascular:  Negative for chest pain, leg swelling, near-syncope, orthopnea, palpitations and syncope.  Respiratory:  Negative for shortness of breath.    Objective  Blood pressure 128/79, pulse 71, temperature 98.6 F (37 C), resp. rate 17, height 5\' 7"  (1.702 m), weight 117 lb (53.1 kg), SpO2 95 %.  Vitals with BMI 01/24/2021 01/24/2021 01/22/2021  Height - 5\' 7"  -  Weight - 117 lbs -  BMI - 18.32 -  Systolic 128 146 [id number]  Diastolic 79 80 93  Pulse 71 68 76      Physical Exam Vitals reviewed.  Constitutional:      General: He is not in acute distress.    Comments: Patient transferred from wheelchair to exam table independently  Cardiovascular:     Rate and Rhythm: Normal rate and regular rhythm.     Pulses: Intact distal pulses.     Heart sounds: S1 normal and S2 normal. No murmur heard.   No gallop.     Comments:  Femoral access site well-healed without evidence of erythema, ecchymosis, hematoma, or bruit. Pulmonary:     Effort: Pulmonary effort is normal.     Breath sounds: Normal breath sounds.  Musculoskeletal:     Right lower leg: No edema.     Left lower leg: No edema.  Neurological:     Mental Status: He is alert.     Comments: Left hemiplegia noted     Laboratory examination:   Recent Labs    10/29/20 0453 10/31/20 0357 11/02/20 0455 01/18/21 1339 01/22/21 1133 01/22/21 1137  NA 137 139 139 140 141 141  K 3.6 3.3* 3.4* 3.6 3.4* 3.4*  CL 103 105 106 102  --    --   CO2 27 27 26 25   --   --   GLUCOSE 93 91 97 99  --   --   BUN 8 14 13 16   --   --   CREATININE 0.62 0.65 0.73 0.78  --   --   CALCIUM 8.3* 8.0* 8.0* 8.4*  --   --   GFRNONAA >60 >60 >60  --   --   --    estimated creatinine clearance is 85.7 mL/min (by C-G formula based on SCr of 0.78 mg/dL).  CMP Latest Ref Rng & Units 01/22/2021 01/22/2021 01/18/2021  Glucose 65 - 99 mg/dL - - 99  BUN 6 - 24 mg/dL - - 16  Creatinine 8.46 - 1.27 mg/dL - - 9.62  Sodium 952 - 145 mmol/L 141 141 140  Potassium 3.5 - 5.1 mmol/L 3.4(L) 3.4(L) 3.6  Chloride 96 - 106 mmol/L - - 102  CO2 20 - 29 mmol/L - - 25  Calcium 8.7 - 10.2 mg/dL - - 8.4(L)  Total Protein 6.5 - 8.1 g/dL - - -  Total Bilirubin 0.3 - 1.2 mg/dL - - -  Alkaline Phos 38 - 126 U/L - - -  AST 15 - 41 U/L - - -  ALT 0 - 44 U/L - - -   CBC Latest Ref Rng & Units 01/22/2021 01/22/2021 01/18/2021  WBC 3.4 - 10.8 x10E3/uL - - 6.9  Hemoglobin 13.0 - 17.0 g/dL 8.4(X) 3.2(G) 11.4(L)  Hematocrit 39.0 - 52.0 % 25.0(L) 26.0(L) 34.1(L)  Platelets 150 - 450 x10E3/uL - - 332    Lipid Panel Recent Labs    10/02/20 0607  CHOL 297*  TRIG 62  LDLCALC 212*  VLDL 12  HDL 73  CHOLHDL 4.1    HEMOGLOBIN A1C Lab Results  Component Value Date   HGBA1C 5.3 10/02/2020   MPG 105.41 10/02/2020   TSH Recent Labs    10/06/20 1648  TSH 1.055    External labs:  12/10/2020: Creatinine 0.62, GFR >90, sodium 140, potassium 4.0  Allergies  No Known Allergies    Medications Prior to Visit:   Outpatient Medications Prior to Visit  Medication Sig Dispense Refill   amLODipine (NORVASC) 10 MG tablet Take 1 tablet (10 mg total) by mouth daily.     aspirin 81 MG chewable tablet Chew 1 tablet (81 mg total) by mouth daily.     atorvastatin (LIPITOR) 80 MG tablet Take 1 tablet (80 mg total) by mouth daily.     citalopram (CELEXA) 20 MG tablet Take 1 tablet (20 mg total) by mouth daily.     clopidogrel (PLAVIX) 75 MG tablet Take 75 mg by mouth daily.      losartan (COZAAR) 25 MG tablet Take 1 tablet (25 mg total) by mouth daily.  90 tablet 3   MAGNESIUM OXIDE 400 PO Take 400 mg by mouth daily.     metoprolol succinate (TOPROL-XL) 50 MG 24 hr tablet Take 1 tablet (50 mg total) by mouth daily. Take with or immediately following a meal. 90 tablet 3   polyethylene glycol (MIRALAX / GLYCOLAX) 17 g packet Take 17 g by mouth daily as needed (Constipation). 14 each 0   senna-docusate (SENOKOT-S) 8.6-50 MG tablet Take 1 tablet by mouth at bedtime as needed for mild constipation.     No facility-administered medications prior to visit.     Final Medications at End of Visit    Current Meds  Medication Sig   amLODipine (NORVASC) 10 MG tablet Take 1 tablet (10 mg total) by mouth daily.   aspirin 81 MG chewable tablet Chew 1 tablet (81 mg total) by mouth daily.   atorvastatin (LIPITOR) 80 MG tablet Take 1 tablet (80 mg total) by mouth daily.   citalopram (CELEXA) 20 MG tablet Take 1 tablet (20 mg total) by mouth daily.   clopidogrel (PLAVIX) 75 MG tablet Take 75 mg by mouth daily.   losartan (COZAAR) 25 MG tablet Take 1 tablet (25 mg total) by mouth daily.   MAGNESIUM OXIDE 400 PO Take 400 mg by mouth daily.   metoprolol succinate (TOPROL-XL) 50 MG 24 hr tablet Take 1 tablet (50 mg total) by mouth daily. Take with or immediately following a meal.   polyethylene glycol (MIRALAX / GLYCOLAX) 17 g packet Take 17 g by mouth daily as needed (Constipation).   senna-docusate (SENOKOT-S) 8.6-50 MG tablet Take 1 tablet by mouth at bedtime as needed for mild constipation.   Radiology:   ECHOCARDIOGRAM LIMITED  Result Date: 01/22/2021    ECHOCARDIOGRAM LIMITED REPORT   Patient Name:   DARRIO BADE Date of Exam: 01/22/2021 Medical Rec #:  [id number]         Height:       67.0 in Accession #:    [id number]        Weight:       117.0 lb Date of Birth:  08-24-1975          BSA:          1.610 m Patient Age:    45 years          BP:           155/92 mmHg Patient Gender:  M                 HR:           66 bpm. Exam Location:  Inpatient Procedure: Limited Echo and Color Doppler Indications:     Atrial Septal Defect Q21.1  History:         Patient has prior history of Echocardiogram examinations, most                  recent 10/08/2020. Signs/Symptoms:Murmur; Risk                  Factors:Hypertension and Dyslipidemia. Non-Ischemic C.  Sonographer:     Leta Jungling RDCS Referring Phys:  [id number] Yates Decamp Diagnosing Phys: Yates Decamp MD IMPRESSIONS  1. 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 The left ventricle has normal function. There is severe left ventricular hypertrophy. Left ventricular diastolic function could not be evaluated.  2. Right ventricular systolic function is normal. The right ventricular size is normal.  3. There is an Amplatzer septal occluder noted  in the atrial septum, Good positioning with no residual leak by color Doppler.  4. The mitral valve is normal in structure. Mild mitral valve regurgitation.  5. The aortic valve is normal in structure. Aortic valve regurgitation is not visualized. No aortic stenosis is present. 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 The left ventricle has normal function. The left ventricular internal cavity size was normal in size. There is severe left ventricular hypertrophy. Left ventricular diastolic function could not be evaluated. Right Ventricle: The right ventricular size is normal. No increase in right ventricular wall thickness. Right ventricular systolic function is normal. Left Atrium: Left atrial size was normal in size. Right Atrium: Right atrial size was normal in size. Pericardium: Trivial pericardial effusion is present. Mitral Valve: The mitral valve is normal in structure. Mild mitral valve regurgitation. Tricuspid Valve: The tricuspid valve is normal in structure. Tricuspid valve regurgitation is trivial. Aortic Valve: The aortic valve is  normal in structure. Aortic valve regurgitation is not visualized. No aortic stenosis is present. Pulmonic Valve: The pulmonic valve was normal in structure. Pulmonic valve regurgitation is not visualized. No evidence of pulmonic stenosis. Aorta: The aortic root is normal in size and structure. IAS/Shunts: No atrial level shunt detected by color flow Doppler. Yates Decamp MD Electronically signed by Yates Decamp MD Signature Date/Time: 01/22/2021/3:27:28 PM    Final    MRI of the brain 10/02/2020: 1. Acute infarcts in the anterior and posterior right frontal lobe. Associated edema with sulcal effacement. Mild curvilinear susceptibility artifact the regions of infarct likely represent petechial hemorrhage. No mass occupying acute hemorrhage. 2. Focal susceptibility artifact within a right M2 MCA branch in the region of the more posterior frontal lobe infarct, compatible with thrombus and known occlusion better characterized on recent CTA. 3. 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 4. Large right mastoid effusion.  CTA Chest 10/09/2020:  1. No pulmonary vascular malformation or acute intrathoracic abnormality. 2. Mild cardiomegaly.  Aortic Atherosclerosis  Cardiac Studies:   Echocardiogram 10/02/2020:  1. Left ventricular ejection fraction, by estimation, is 40 to 45%. The left ventricle has mildly decreased function. The left ventricle demonstrates global hypokinesis. There is mild concentric left ventricular hypertrophy. Left ventricular diastolic parameters are consistent with Grade I diastolic dysfunction (impaired relaxation). Elevated left atrial pressure.  2. Right ventricular systolic function is normal. The right ventricular size is normal.  3. The pericardial effusion is circumferential. There is no evidence of cardiac tamponade.  4. The mitral valve is normal in structure. Trivial mitral valve regurgitation. No evidence of mitral stenosis.  5. The aortic valve is normal in  structure. Aortic valve regurgitation is trivial. No aortic stenosis is present.  6. Aortic dilatation noted. There is mild dilatation of the aortic root, measuring 44 mm. There is mild dilatation of the ascending aorta, measuring 42 mm.  7. The inferior vena cava is normal in size with greater than 50% respiratory variability, suggesting right atrial pressure of 3 mmHg.   Lower extremity venous duplex 10/03/2020: BILATERAL: - No evidence of deep vein thrombosis seen in the lower extremities, bilaterally. - No evidence of superficial venous thrombosis in the lower extremities, bilaterally. -No evidence of popliteal cyst, bilaterally.   TEE 10/09/2020: Preliminary, personally reviewed by me. LVEF appears to be mildly depressed, EF around 45% with global hypokinesis.  No significant valvular abnormality.  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   TCD bubble study pulmonary 10/09/2020: A vascular evaluation was performed. The right middle cerebral artery was studied. An IV was inserted into the patient's right forearm . Verbal informed consent was obtained.  HITS heard at rest & valsalva: spencer grade 2 See table(s) above for TCD measurements and observations.    Ambulatory cardiac telemetry 12/03/2020- - 12/16/2020: Predominant rhythm was sinus with first-degree AV block.  Minimum heart rate 45 bpm, maximum heart rate 120 bpm, average heart rate 65 bpm.  There were no patient triggered events.  Patient had single episode of supraventricular tachycardia lasting 6 beats.  Rare PACs and PVCs.  Ventricular trigeminy was present.  No evidence of ventricular tachycardia, pauses >3 seconds, high degree AV block, or atrial fibrillation.  Echocardiogram 01/22/2021:  1. 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 The left ventricle has normal function. There is severe left ventricular  hypertrophy. Left ventricular diastolic function could not be evaluated.  2. Right ventricular systolic function is normal. The right ventricular size is normal.  3. There is an Amplatzer septal occluder noted in the atrial septum, Good positioning with no residual leak by color Doppler.  4. The mitral valve is normal in structure. Mild mitral valve regurgitation.  5. The aortic valve is normal in structure. Aortic valve regurgitation is not visualized. No aortic stenosis is present.    Atrial septal defect repair 01/22/2021: There was a moderate sized PFO with ASD anatomy, successful repair with implantation of a 10 mm Amplatzer ASO device.  Neck Strongly positive double contrast study prior to the procedure, weakly positive double contrast study postprocedure suggestive of residual right to left shunting. Normal right heart pressures.  Normal cardiac output and cardiac index.  Qp/Qs normal.  No significant (right to left) shunting.   Recommend: Aspirin indefinitely, Plavix can be stopped after 30 days to at most 90 days.  EKG:   12/03/2020: Sinus tachycardia 108 bpm.  Left axis, left anterior fascicular block.  Incomplete right bundle branch block. LVH.   10/01/2020: Normal sinus rhythm at rate of 90 bpm, normal axis, LVH by Cain Saupe criteria.  Nonspecific T abnormality, cannot exclude lateral ischemia.  Normal QT interval.  No prior EKG to compare.  Abnormal EKG.  Assessment     ICD-10-CM   1. Essential hypertension  I10     2. H/O congenital atrial septal defect (ASD) repair 01/22/2021: 10 mm Amplatzer ASO device  Z87.74     3. Aortic root dilatation (HCC)  I77.810 PCV ECHOCARDIOGRAM COMPLETE       There are no discontinued medications.  No orders of the defined types were placed in this encounter.  Recommendations:   Harry [name] is a 45 y.o. male with history of hypertension, hyperlipidemia, non-ischemic cardiomyopathy likely secondary to untreated hypertension. He presented to  [facility] 10/09/2020 with new onset left arm and leg weakness where he received tPA for acte stroke. TEE found right to left shunting suggestive of ASD/PFO, CTA ruled out pulmonary vascular malformation. He underwent intracardiac echo guided repair of the atrial septal defect with implantation of a 10 mm ASO Amplatzer device on 01/22/2021.  Patient presents for follow-up of ASD closure.  He is presently doing well and is asymptomatic and without clinical evidence of heart failure.  Reviewed and discussed with patient results of ambulatory cardiac telemetry which revealed no significant cardiac arrhythmias, no evidence of atrial fibrillation.  Blood pressure is now well controlled.  Notably echocardiogram revealed  aortic root dilation, will therefore obtain repeat echocardiogram in 1 year.  Also review of most recent lipid profile testing reveals LDL significantly elevated above goal, patient follows with PCP in his long-term care facility for management of hyperlipidemia.  Would recommend repeat lipid profile testing, will defer further management to PCP.  Patient is otherwise stable from cardiovascular standpoint.  Follow-up in 6 months, sooner if needed, for hypertension, hyperlipidemia, nonischemic cardiomyopathy, ASD status postclosure.  Patient was seen in collaboration with Dr. Jacinto Halim and he is in agreement with the plan.     Rayford Halsted, PA-C 01/24/2021, 11:36 AM Office: [phone]

## 2021-01-25 LAB — POCT ACTIVATED CLOTTING TIME
Activated Clotting Time: 219 seconds
Activated Clotting Time: 190 seconds

## 2021-02-05 ENCOUNTER — Other Ambulatory Visit: Payer: Self-pay

## 2021-02-05 NOTE — Patient Outreach (Signed)
[facility]) Care Management  02/05/2021  Harry [name] 25-Sep-1975 [id number]   Telephone outreach to patient to obtain mRS was successfully completed. MRS= 4  Thank you, Vanice Sarah [facility] Care Management Assistant

## 2021-03-27 ENCOUNTER — Ambulatory Visit: Payer: Medicaid Other | Admitting: Student

## 2021-07-08 ENCOUNTER — Encounter: Payer: Self-pay | Admitting: Internal Medicine

## 2021-07-25 NOTE — Progress Notes (Signed)
Primary Physician/Referring:  Benito Mccreedy, MD  Patient ID: Harry [name], male    DOB: 1976/02/28, 46 y.o.   MRN: [id number]  Chief Complaint  Patient presents with   Hypertension   ASD s/p closure    6 month   HPI:    Harry [name]  is a 46 y.o. male with history of hypertension, hyperlipidemia, non-ischemic cardiomyopathy likely secondary to untreated hypertension. He presented to [facility] 10/09/2020 with new onset left arm and leg weakness where he received tPA for acte stroke. TEE found right to left shunting suggestive of ASD/PFO, CTA ruled out pulmonary vascular malformation. He underwent intracardiac echo guided repair of the atrial septal defect with implantation of a 10 mm ASO Amplatzer device on 01/22/2021.  Patient was last seen in the office 01/24/2021 at which time cardiac monitor had revealed no significant arrhythmias patient was doing well overall, therefore no changes were made.  Patient was diagnosed with COVID-19 infection on 05/26/2021 and was started on molnupiravir. Patient was subsequently admitted to [facility] 06/01/2021 - 06/02/2021 with altered mental status.  Work-up at that time was unremarkable including normal chest x-ray, normal MRI.  He was hypokalemic, therefore potassium was repleted.  Upon discharge it was suspected patient's altered mental status was secondary to COVID-19 infection versus antiviral medication.  Discharge patient followed up with [facility] cardiology for placement of cardiac monitor and echocardiogram.   He now present to our office for follow up accompanied by his brother.  Patient has had no recurrence of altered mental status, or near syncope.  He is feeling well overall.  He is currently living with his brother instead of an his previous long-term care facility.  Patient is now ambulating with a walker.  Past Medical History:  Diagnosis Date   Hypertension    PFO (patent foramen ovale)    Past Surgical  History:  Procedure Laterality Date   BUBBLE STUDY  10/08/2020   Procedure: BUBBLE STUDY;  Surgeon: Sueanne Margarita, MD;  Location: [place] ENDOSCOPY;  Service: Cardiovascular;;   PATENT FORAMEN OVALE(PFO) CLOSURE N/A 01/22/2021   Procedure: PATENT FORAMEN OVALE (PFO) CLOSURE;  Surgeon: Adrian Prows, MD;  Location: [place] CV LAB;  Service: Cardiovascular;  Laterality: N/A;   RIGHT HEART CATH N/A 01/22/2021   Procedure: RIGHT HEART CATH;  Surgeon: Adrian Prows, MD;  Location: [place] CV LAB;  Service: Cardiovascular;  Laterality: N/A;   TEE WITHOUT CARDIOVERSION N/A 10/08/2020   Procedure: TRANSESOPHAGEAL ECHOCARDIOGRAM (TEE);  Surgeon: Sueanne Margarita, MD;  Location: [facility] ENDOSCOPY;  Service: Cardiovascular;  Laterality: N/A;   Family History  Problem Relation Age of Onset   Cervical cancer Mother    Stroke Father    Heart Problems Sister        died from blood clot   Diabetes Brother     Social History   Tobacco Use   Smoking status: Never   Smokeless tobacco: Never  Substance Use Topics   Alcohol use: Never   Marital Status: Single   ROS  Review of Systems  Cardiovascular:  Negative for chest pain, leg swelling, near-syncope, orthopnea, palpitations and syncope.  Respiratory:  Negative for shortness of breath.    Objective  Blood pressure (!) 143/84, pulse 77, temperature 97.8 F (36.6 C), temperature source Temporal, resp. rate 17, height 5\' 7"  (1.702 m), weight 110 lb 6.4 oz (50.1 kg), SpO2 98 %.  Vitals with BMI 07/29/2021 01/24/2021 01/24/2021  Height 5\' 7"  - 5\' 7"   Weight 110 lbs 6 oz - 117 lbs  BMI 123XX123 - 0000000  Systolic [id number] 0000000 [id number]  Diastolic 84 79 80  Pulse 77 71 68      Physical Exam Vitals reviewed.  Constitutional:      General: He is not in acute distress.    Comments: Ambulating with walker   Cardiovascular:     Rate and Rhythm: Normal rate and regular rhythm.     Pulses: Intact distal pulses.     Heart sounds: S1 normal and S2 normal. No murmur heard.   No  gallop.  Pulmonary:     Effort: Pulmonary effort is normal.     Breath sounds: Normal breath sounds.  Musculoskeletal:     Right lower leg: No edema.     Left lower leg: No edema.  Neurological:     Mental Status: He is alert.     Comments: Left hemiplegia noted     Laboratory examination:   Recent Labs    10/29/20 0453 10/31/20 0357 11/02/20 0455 01/18/21 1339 01/22/21 1133 01/22/21 1137  NA 137 139 139 140 141 141  K 3.6 3.3* 3.4* 3.6 3.4* 3.4*  CL 103 105 106 102  --   --   CO2 27 27 26 25   --   --   GLUCOSE 93 91 97 99  --   --   BUN 8 14 13 16   --   --   CREATININE 0.62 0.65 0.73 0.78  --   --   CALCIUM 8.3* 8.0* 8.0* 8.4*  --   --   GFRNONAA >60 >60 >60  --   --   --    CrCl cannot be calculated (Patient's most recent lab result is older than the maximum 21 days allowed.).  CMP Latest Ref Rng & Units 01/22/2021 01/22/2021 01/18/2021  Glucose 65 - 99 mg/dL - - 99  BUN 6 - 24 mg/dL - - 16  Creatinine 0.76 - 1.27 mg/dL - - 0.78  Sodium 135 - 145 mmol/L 141 141 140  Potassium 3.5 - 5.1 mmol/L 3.4(L) 3.4(L) 3.6  Chloride 96 - 106 mmol/L - - 102  CO2 20 - 29 mmol/L - - 25  Calcium 8.7 - 10.2 mg/dL - - 8.4(L)  Total Protein 6.5 - 8.1 g/dL - - -  Total Bilirubin 0.3 - 1.2 mg/dL - - -  Alkaline Phos 38 - 126 U/L - - -  AST 15 - 41 U/L - - -  ALT 0 - 44 U/L - - -   CBC Latest Ref Rng & Units 01/22/2021 01/22/2021 01/18/2021  WBC 3.4 - 10.8 x10E3/uL - - 6.9  Hemoglobin 13.0 - 17.0 g/dL 8.5(L) 8.8(L) 11.4(L)  Hematocrit 39.0 - 52.0 % 25.0(L) 26.0(L) 34.1(L)  Platelets 150 - 450 x10E3/uL - - 332    Lipid Panel Recent Labs    10/02/20 0607  CHOL 297*  TRIG 62  LDLCALC 212*  VLDL 12  HDL 73  CHOLHDL 4.1    HEMOGLOBIN A1C Lab Results  Component Value Date   HGBA1C 5.3 10/02/2020   MPG 105.41 10/02/2020   TSH Recent Labs    10/06/20 1648  TSH 1.055    External labs:  12/10/2020: Creatinine 0.62, GFR >90, sodium 140, potassium 4.0  Allergies  No Known  Allergies    Medications Prior to Visit:   Outpatient Medications Prior to Visit  Medication Sig Dispense Refill   amLODipine (NORVASC) 10 MG tablet Take 1 tablet (10 mg total)  by mouth daily.     aspirin 325 MG EC tablet Take 1 tablet by mouth daily.     atorvastatin (LIPITOR) 80 MG tablet Take 1 tablet (80 mg total) by mouth daily.     calcium carbonate (OS-CAL) 1250 (500 Ca) MG chewable tablet Chew 1 tablet by mouth as needed.     citalopram (CELEXA) 20 MG tablet Take 1 tablet (20 mg total) by mouth daily.     clopidogrel (PLAVIX) 75 MG tablet Take 75 mg by mouth daily.     ferrous sulfate 325 (65 FE) MG tablet Take 1 tablet by mouth daily.     losartan (COZAAR) 25 MG tablet Take 1 tablet (25 mg total) by mouth daily. 90 tablet 3   MAGNESIUM OXIDE 400 PO Take 400 mg by mouth daily.     metoprolol succinate (TOPROL-XL) 50 MG 24 hr tablet Take 1 tablet (50 mg total) by mouth daily. Take with or immediately following a meal. 90 tablet 3   ondansetron (ZOFRAN) 4 MG tablet Take 1 tablet by mouth as needed.     polyethylene glycol (MIRALAX / GLYCOLAX) 17 g packet Take 17 g by mouth daily as needed (Constipation). 14 each 0   senna-docusate (SENOKOT-S) 8.6-50 MG tablet Take 1 tablet by mouth at bedtime as needed for mild constipation.     aspirin 81 MG chewable tablet Chew 1 tablet (81 mg total) by mouth daily.     No facility-administered medications prior to visit.     Final Medications at End of Visit    Current Meds  Medication Sig   amLODipine (NORVASC) 10 MG tablet Take 1 tablet (10 mg total) by mouth daily.   aspirin 325 MG EC tablet Take 1 tablet by mouth daily.   atorvastatin (LIPITOR) 80 MG tablet Take 1 tablet (80 mg total) by mouth daily.   calcium carbonate (OS-CAL) 1250 (500 Ca) MG chewable tablet Chew 1 tablet by mouth as needed.   citalopram (CELEXA) 20 MG tablet Take 1 tablet (20 mg total) by mouth daily.   clopidogrel (PLAVIX) 75 MG tablet Take 75 mg by mouth daily.    ferrous sulfate 325 (65 FE) MG tablet Take 1 tablet by mouth daily.   losartan (COZAAR) 25 MG tablet Take 1 tablet (25 mg total) by mouth daily.   MAGNESIUM OXIDE 400 PO Take 400 mg by mouth daily.   metoprolol succinate (TOPROL-XL) 50 MG 24 hr tablet Take 1 tablet (50 mg total) by mouth daily. Take with or immediately following a meal.   ondansetron (ZOFRAN) 4 MG tablet Take 1 tablet by mouth as needed.   polyethylene glycol (MIRALAX / GLYCOLAX) 17 g packet Take 17 g by mouth daily as needed (Constipation).   senna-docusate (SENOKOT-S) 8.6-50 MG tablet Take 1 tablet by mouth at bedtime as needed for mild constipation.   Radiology:   No results found. MRI of the brain 10/02/2020: 1. Acute infarcts in the anterior and posterior right frontal lobe. Associated edema with sulcal effacement. Mild curvilinear susceptibility artifact the regions of infarct likely represent petechial hemorrhage. No mass occupying acute hemorrhage. 2. Focal susceptibility artifact within a right M2 MCA branch in the region of the more posterior frontal lobe infarct, compatible with thrombus and known occlusion better characterized on recent CTA. 3. 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 4. Large right mastoid effusion.  CTA Chest 10/09/2020:  1. No pulmonary vascular malformation or acute intrathoracic abnormality. 2. Mild cardiomegaly.  Aortic Atherosclerosis  Cardiac  Studies:   Echocardiogram 10/02/2020:  1. Left ventricular ejection fraction, by estimation, is 40 to 45%. The left ventricle has mildly decreased function. The left ventricle demonstrates global hypokinesis. There is mild concentric left ventricular hypertrophy. Left ventricular diastolic parameters are consistent with Grade I diastolic dysfunction (impaired relaxation). Elevated left atrial pressure.  2. Right ventricular systolic function is normal. The right ventricular size is normal.  3. The pericardial effusion is  circumferential. There is no evidence of cardiac tamponade.  4. The mitral valve is normal in structure. Trivial mitral valve regurgitation. No evidence of mitral stenosis.  5. The aortic valve is normal in structure. Aortic valve regurgitation is trivial. No aortic stenosis is present.  6. Aortic dilatation noted. There is mild dilatation of the aortic root, measuring 44 mm. There is mild dilatation of the ascending aorta, measuring 42 mm.  7. The inferior vena cava is normal in size with greater than 50% respiratory variability, suggesting right atrial pressure of 3 mmHg.   Lower extremity venous duplex 10/03/2020: BILATERAL: - No evidence of deep vein thrombosis seen in the lower extremities, bilaterally. - No evidence of superficial venous thrombosis in the lower extremities, bilaterally. -No evidence of popliteal cyst, bilaterally.   TEE 10/09/2020: Preliminary, personally reviewed by me. LVEF appears to be mildly depressed, EF around 45% with global hypokinesis.  No significant valvular abnormality.  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   TCD bubble study pulmonary 10/09/2020: A vascular evaluation was performed. The right middle cerebral artery was studied. An IV was inserted into the patient's right forearm . Verbal informed consent was obtained.  HITS heard at rest & valsalva: spencer grade 2 See table(s) above for TCD measurements and observations.    Ambulatory cardiac telemetry 12/03/2020- - 12/16/2020: Predominant rhythm was sinus with first-degree AV block.  Minimum heart rate 45 bpm, maximum heart rate 120 bpm, average heart rate 65 bpm.  There were no patient triggered events.  Patient had single episode of supraventricular tachycardia lasting 6 beats.  Rare PACs and PVCs.  Ventricular trigeminy was present.  No evidence of ventricular tachycardia, pauses >3 seconds, high degree  AV block, or atrial fibrillation.  Echocardiogram 01/22/2021:  1. 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 The left ventricle has normal function. There is severe left ventricular hypertrophy. Left ventricular diastolic function could not be evaluated.  2. Right ventricular systolic function is normal. The right ventricular size is normal.  3. There is an Amplatzer septal occluder noted in the atrial septum, Good positioning with no residual leak by color Doppler.  4. The mitral valve is normal in structure. Mild mitral valve regurgitation.  5. The aortic valve is normal in structure. Aortic valve regurgitation is not visualized. No aortic stenosis is present.    Atrial septal defect repair 01/22/2021: There was a moderate sized PFO with ASD anatomy, successful repair with implantation of a 10 mm Amplatzer ASO device.  Neck Strongly positive double contrast study prior to the procedure, weakly positive double contrast study postprocedure suggestive of residual right to left shunting. Normal right heart pressures.  Normal cardiac output and cardiac index.  Qp/Qs normal.  No significant (right to left) shunting.   Recommend: Aspirin indefinitely, Plavix can be stopped after 30 days to at most 90 days.  External echocardiogram 06/03/2021: The left ventricular size is normal.  LV ejection fraction = 60-65%.  Injection  of agitated saline showed no right-to-left shunt.  Trace to mild pulmonic valvular regurgitation.  There is small size pericardial effusion.  There is moderate concentric left ventricular hypertrophy.   EKG:  07/29/2021: Sinus rhythm at a rate of 68 bpm.  Left axis, left anterior fascicular block.  Incomplete right bundle branch block.  LVH with secondary ST-T wave changes..  Compared to EKG 12/03/2020, no significant change.  10/01/2020: Normal sinus rhythm at rate of 90 bpm, normal axis, LVH by Edmonia Lynch criteria.  Nonspecific T abnormality, cannot exclude lateral  ischemia.  Normal QT interval.  No prior EKG to compare.  Abnormal EKG.  Assessment     ICD-10-CM   1. Essential hypertension  I10 EKG 12-Lead    2. Non-ischemic cardiomyopathy ([place])  I42.8     3. Aortic root dilatation ([facility])  I77.810     4. H/O congenital atrial septal defect (ASD) repair 01/22/2021: 10 mm Amplatzer ASO device  Z87.74        Medications Discontinued During This Encounter  Medication Reason   aspirin 81 MG chewable tablet     No orders of the defined types were placed in this encounter.   Recommendations:   Harry [name] is a 46 y.o. male with history of hypertension, hyperlipidemia, non-ischemic cardiomyopathy likely secondary to untreated hypertension. He presented to [facility] 10/09/2020 with new onset left arm and leg weakness where he received tPA for acte stroke. TEE found right to left shunting suggestive of ASD/PFO, CTA ruled out pulmonary vascular malformation. He underwent intracardiac echo guided repair of the atrial septal defect with implantation of a 10 mm ASO Amplatzer device on 01/22/2021.  Patient was last seen in the office 01/24/2021 at which time cardiac monitor had revealed no significant arrhythmias patient was doing well overall, therefore no changes were made.  Patient was diagnosed with COVID-19 infection on 05/26/2021 and was started on molnupiravir. Patient was subsequently admitted to [facility] 06/01/2021 - 06/02/2021 with altered mental status.  Work-up at that time was unremarkable including normal chest x-ray, normal MRI.  He was hypokalemic, therefore potassium was repleted.  Upon discharge it was suspected patient's altered mental status was secondary to COVID-19 infection versus antiviral medication.  Discharge patient followed up with [facility] cardiology for placement of cardiac monitor and echocardiogram.   He now present to our office for follow up.  I personally reviewed results of echocardiogram done during  hospitalization, it is unchanged compared to previous.  We have requested external records regarding results of cardiac monitor.  However suspect patient's episode of altered mental status was secondary to COVID-19 infection and antiviral treatment.  Patient's blood pressure is mildly elevated in the office, however home readings are reportedly well controlled.  We will hold off on making changes to medications at this time.  Instead encouraged patient and his brother to monitor blood pressure closely at home and notify our office if it remains >130/80 mmHg consistently.  Notably previous cardiac telemetry revealed no evidence of significant cardiac arrhythmias.  Previous echocardiogram did reveal aortic root dilation, will therefore repeat echocardiogram in 1 year.  Continue amlodipine, atorvastatin, losartan.  We will defer management of hyperlipidemia to patient's PCP.  Patient is otherwise stable from a cardiovascular standpoint.  Follow-up in 6 months, sooner if needed.  I reviewed external records including provider notes, labs, and echocardiogram results.  Patient's brother independently contributed to history regarding patient's recent hospitalization.    Alethia Berthold, PA-C 07/29/2021, 11:46 AM Office:  [phone]

## 2021-07-29 ENCOUNTER — Ambulatory Visit: Payer: Medicaid Other | Admitting: Student

## 2021-07-29 ENCOUNTER — Encounter: Payer: Self-pay | Admitting: Student

## 2021-07-29 ENCOUNTER — Other Ambulatory Visit: Payer: Self-pay

## 2021-07-29 VITALS — BP 143/84 | HR 77 | Temp 97.8°F | Resp 17 | Ht 67.0 in | Wt 110.4 lb

## 2021-07-29 DIAGNOSIS — I10 Essential (primary) hypertension: Secondary | ICD-10-CM

## 2021-07-29 DIAGNOSIS — I77810 Thoracic aortic ectasia: Secondary | ICD-10-CM

## 2021-07-29 DIAGNOSIS — I428 Other cardiomyopathies: Secondary | ICD-10-CM

## 2021-07-29 DIAGNOSIS — Z8774 Personal history of (corrected) congenital malformations of heart and circulatory system: Secondary | ICD-10-CM

## 2021-08-08 ENCOUNTER — Encounter: Payer: Self-pay | Admitting: Internal Medicine

## 2022-01-24 ENCOUNTER — Other Ambulatory Visit: Payer: Medicaid Other

## 2022-01-27 ENCOUNTER — Ambulatory Visit: Payer: Medicaid Other | Admitting: Cardiology

## 2022-01-27 NOTE — Progress Notes (Deleted)
Primary Physician/Referring:  Jackie Plum, MD  Patient ID: Harry [name], male    DOB: Apr 07, 1976, 46 y.o.   MRN: [id number]  No chief complaint on file.  HPI:    Harry [name]  is a 46 y.o. male with history of hypertension, hyperlipidemia, non-ischemic cardiomyopathy likely secondary to untreated hypertension. He presented to [facility] 10/09/2020 with new onset left arm and leg weakness where he received tPA for acte stroke. TEE found right to left shunting suggestive of ASD/PFO, CTA ruled out pulmonary vascular malformation. He underwent intracardiac echo guided repair of the atrial septal defect with implantation of a 10 mm ASO Amplatzer device on 01/22/2021.  Patient was last seen in the office 01/24/2021 at which time cardiac monitor had revealed no significant arrhythmias patient was doing well overall, therefore no changes were made.  Patient was diagnosed with COVID-19 infection on 05/26/2021 and was started on molnupiravir. Patient was subsequently admitted to [facility] 06/01/2021 - 06/02/2021 with altered mental status.  Work-up at that time was unremarkable including normal chest x-ray, normal MRI.  He was hypokalemic, therefore potassium was repleted.  Upon discharge it was suspected patient's altered mental status was secondary to COVID-19 infection versus antiviral medication.  Discharge patient followed up with [facility] cardiology for placement of cardiac monitor and echocardiogram.   He now present to our office for follow up accompanied by his brother.  Patient has had no recurrence of altered mental status, or near syncope.  He is feeling well overall.  He is currently living with his brother instead of an his previous long-term care facility.  Patient is now ambulating with a walker.  Past Medical History:  Diagnosis Date   Hypertension    PFO (patent foramen ovale)    Past Surgical History:  Procedure Laterality Date   BUBBLE STUDY  10/08/2020    Procedure: BUBBLE STUDY;  Surgeon: Quintella Reichert, MD;  Location: [facility] ENDOSCOPY;  Service: Cardiovascular;;   PATENT FORAMEN OVALE(PFO) CLOSURE N/A 01/22/2021   Procedure: PATENT FORAMEN OVALE (PFO) CLOSURE;  Surgeon: Yates Decamp, MD;  Location: [facility] INVASIVE CV LAB;  Service: Cardiovascular;  Laterality: N/A;   RIGHT HEART CATH N/A 01/22/2021   Procedure: RIGHT HEART CATH;  Surgeon: Yates Decamp, MD;  Location: [facility] INVASIVE CV LAB;  Service: Cardiovascular;  Laterality: N/A;   TEE WITHOUT CARDIOVERSION N/A 10/08/2020   Procedure: TRANSESOPHAGEAL ECHOCARDIOGRAM (TEE);  Surgeon: Quintella Reichert, MD;  Location: [facility] ENDOSCOPY;  Service: Cardiovascular;  Laterality: N/A;   Family History  Problem Relation Age of Onset   Cervical cancer Mother    Stroke Father    Heart Problems Sister        died from blood clot   Diabetes Brother     Social History   Tobacco Use   Smoking status: Never   Smokeless tobacco: Never  Substance Use Topics   Alcohol use: Never   Marital Status: Single   ROS  Review of Systems  Cardiovascular:  Negative for chest pain, leg swelling, near-syncope, orthopnea, palpitations and syncope.  Respiratory:  Negative for shortness of breath.     Objective  There were no vitals taken for this visit.     07/29/2021   10:01 AM 01/24/2021   10:29 AM 01/24/2021    9:55 AM  Vitals with BMI  Height 5\' 7"   5\' 7"   Weight 110 lbs 6 oz  117 lbs  BMI 17.29  18.32  Systolic 143 128  Diastolic  84 79 80  Pulse 77 71 68      Physical Exam Vitals reviewed.  Constitutional:      General: He is not in acute distress.    Comments: Ambulating with walker   Cardiovascular:     Rate and Rhythm: Normal rate and regular rhythm.     Pulses: Intact distal pulses.     Heart sounds: S1 normal and S2 normal. No murmur heard.    No gallop.  Pulmonary:     Effort: Pulmonary effort is normal.     Breath sounds: Normal breath sounds.  Musculoskeletal:     Right lower leg: No edema.      Left lower leg: No edema.  Neurological:     Mental Status: He is alert.     Comments: Left hemiplegia noted      Laboratory examination:   External labs:    Labs 01/09/2022:  Hb 11.2/HCT 34.0, platelets 346, normal indicis.  Serum glucose: 41 mg, BUN 11, creatinine 0.74, GFR 112 mL, potassium 3.6, LFTs normal.  Iron studies normal.  TSH 3.06.  A1c 5.7%.  Labs 06/27/2021:  Total cholesterol 138, triglycerides 91, HDL 50, LDL 71.  Non-HDL cholesterol 88.  Allergies  No Known Allergies   Final Medications at End of Visit     Current Outpatient Medications:    amLODipine (NORVASC) 10 MG tablet, Take 1 tablet (10 mg total) by mouth daily., Disp: , Rfl:    aspirin 325 MG EC tablet, Take 1 tablet by mouth daily., Disp: , Rfl:    atorvastatin (LIPITOR) 80 MG tablet, Take 1 tablet (80 mg total) by mouth daily., Disp: , Rfl:    calcium carbonate (OS-CAL) 1250 (500 Ca) MG chewable tablet, Chew 1 tablet by mouth as needed., Disp: , Rfl:    citalopram (CELEXA) 20 MG tablet, Take 1 tablet (20 mg total) by mouth daily., Disp: , Rfl:    clopidogrel (PLAVIX) 75 MG tablet, Take 75 mg by mouth daily., Disp: , Rfl:    ferrous sulfate 325 (65 FE) MG tablet, Take 1 tablet by mouth daily., Disp: , Rfl:    losartan (COZAAR) 25 MG tablet, Take 1 tablet (25 mg total) by mouth daily., Disp: 90 tablet, Rfl: 3   MAGNESIUM OXIDE 400 PO, Take 400 mg by mouth daily., Disp: , Rfl:    metoprolol succinate (TOPROL-XL) 50 MG 24 hr tablet, Take 1 tablet (50 mg total) by mouth daily. Take with or immediately following a meal., Disp: 90 tablet, Rfl: 3   ondansetron (ZOFRAN) 4 MG tablet, Take 1 tablet by mouth as needed., Disp: , Rfl:    polyethylene glycol (MIRALAX / GLYCOLAX) 17 g packet, Take 17 g by mouth daily as needed (Constipation)., Disp: 14 each, Rfl: 0   senna-docusate (SENOKOT-S) 8.6-50 MG tablet, Take 1 tablet by mouth at bedtime as needed for mild constipation., Disp: , Rfl:   Radiology:   MRI  of the brain 10/02/2020: 1. Acute infarcts in the anterior and posterior right frontal lobe. Associated edema with sulcal effacement. Mild curvilinear susceptibility artifact the regions of infarct likely represent petechial hemorrhage. No mass occupying acute hemorrhage. 2. Focal susceptibility artifact within a right M2 MCA branch in the region of the more posterior frontal lobe infarct, compatible with thrombus and known occlusion better characterized on recent CTA. 3. 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 4. Large right mastoid effusion.  CTA Chest 10/09/2020:  1. No pulmonary vascular malformation or acute intrathoracic abnormality. 2.  Mild cardiomegaly.  Aortic Atherosclerosis  Cardiac Studies:    Echocardiogram 01/22/2021:  1. 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 The left ventricle has normal function. There is severe left ventricular hypertrophy. Left ventricular diastolic function could not be evaluated.  2. Right ventricular systolic function is normal. The right ventricular size is normal.  3. There is an Amplatzer septal occluder noted in the atrial septum, Good positioning with no residual leak by color Doppler.  4. The mitral valve is normal in structure. Mild mitral valve regurgitation.  5. The aortic valve is normal in structure. Aortic valve regurgitation is not visualized. No aortic stenosis is present.    Atrial septal defect repair 01/22/2021: There was a moderate sized PFO with ASD anatomy, successful repair with implantation of a 10 mm Amplatzer ASO device.  Neck Strongly positive double contrast study prior to the procedure, weakly positive double contrast study postprocedure suggestive of residual right to left shunting. Normal right heart pressures.  Normal cardiac output and cardiac index.  Qp/Qs normal.  No significant (right to left) shunting.   Recommend: Aspirin indefinitely, Plavix can be stopped after 30 days to at  most 90 days.  External echocardiogram 06/03/2021: The left ventricular size is normal.  LV ejection fraction = 60-65%.  Injection of agitated saline showed no right-to-left shunt.  Trace to mild pulmonic valvular regurgitation.  There is small size pericardial effusion.  There is moderate concentric left ventricular hypertrophy.   EKG:  07/29/2021: Sinus rhythm at a rate of 68 bpm.  Left axis, left anterior fascicular block.  Incomplete right bundle branch block.  LVH with secondary ST-T wave changes..  Compared to EKG 12/03/2020, no significant change.  10/01/2020: Normal sinus rhythm at rate of 90 bpm, normal axis, LVH by Cain Saupe criteria.  Nonspecific T abnormality, cannot exclude lateral ischemia.  Normal QT interval.  No prior EKG to compare.  Abnormal EKG.  Assessment     ICD-10-CM   1. Non-ischemic cardiomyopathy (HCC)  I42.8     2. Aortic root dilatation (HCC)  I77.810     3. H/O congenital atrial septal defect (ASD) repair 01/22/2021: 10 mm Amplatzer ASO device  Z87.74     4. Primary hypertension  I10        There are no discontinued medications.   No orders of the defined types were placed in this encounter.   Recommendations:   Harry [name] is a 46 y.o. male with history of hypertension, hyperlipidemia, non-ischemic cardiomyopathy likely secondary to untreated hypertension. He presented to [facility] 10/09/2020 with new onset left arm and leg weakness where he received tPA for acte stroke. TEE found right to left shunting suggestive of ASD/PFO, CTA ruled out pulmonary vascular malformation. He underwent intracardiac echo guided repair of the atrial septal defect with implantation of a 10 mm ASO Amplatzer device on 01/22/2021.  Patient was last seen in the office 01/24/2021 at which time cardiac monitor had revealed no significant arrhythmias patient was doing well overall, therefore no changes were made.  Patient was diagnosed with COVID-19 infection on 05/26/2021 and was  started on molnupiravir. Patient was subsequently admitted to [facility] 06/01/2021 - 06/02/2021 with altered mental status.  Work-up at that time was unremarkable including normal chest x-ray, normal MRI.  He was hypokalemic, therefore potassium was repleted.  Upon discharge it was suspected patient's altered mental status was secondary to COVID-19 infection versus antiviral medication.  Discharge patient followed up with [facility] cardiology for placement  of cardiac monitor and echocardiogram.   He now present to our office for follow up.  I personally reviewed results of echocardiogram done during hospitalization, it is unchanged compared to previous.  We have requested external records regarding results of cardiac monitor.  However suspect patient's episode of altered mental status was secondary to COVID-19 infection and antiviral treatment.  Patient's blood pressure is mildly elevated in the office, however home readings are reportedly well controlled.  We will hold off on making changes to medications at this time.  Instead encouraged patient and his brother to monitor blood pressure closely at home and notify our office if it remains >130/80 mmHg consistently.  Notably previous cardiac telemetry revealed no evidence of significant cardiac arrhythmias.  Previous echocardiogram did reveal aortic root dilation, will therefore repeat echocardiogram in 1 year.  Continue amlodipine, atorvastatin, losartan.  We will defer management of hyperlipidemia to patient's PCP.  Patient is otherwise stable from a cardiovascular standpoint.  Follow-up in 6 months, sooner if needed.  I reviewed external records including provider notes, labs, and echocardiogram results.  Patient's brother independently contributed to history regarding patient's recent hospitalization.    Yates Decamp, PA-C 01/27/2022, 7:18 AM Office: [phone]

## 2022-05-12 ENCOUNTER — Other Ambulatory Visit: Payer: Self-pay

## 2022-05-12 DIAGNOSIS — I10 Essential (primary) hypertension: Secondary | ICD-10-CM

## 2022-05-19 ENCOUNTER — Other Ambulatory Visit: Payer: Medicaid Other

## 2022-05-19 ENCOUNTER — Telehealth: Payer: Self-pay | Admitting: Cardiology

## 2022-05-19 NOTE — Telephone Encounter (Signed)
No call  and ask if she is keeping up the appointment or should cancel it. Will not see her unless she pays for no show for echo as per office policy

## 2022-05-19 NOTE — Telephone Encounter (Signed)
Please dismiss if she has missed a total of 3 appointments including scheduled tests

## 2022-05-19 NOTE — Telephone Encounter (Signed)
Patient has no showed two echo appts as well as last OV appt. He is scheduled to see you on 05/26/22. Please advise. Should we try to schedule echo one more time?

## 2022-05-19 NOTE — Telephone Encounter (Signed)
Please do

## 2022-05-26 ENCOUNTER — Encounter: Payer: Self-pay | Admitting: Cardiology

## 2022-05-26 ENCOUNTER — Ambulatory Visit: Payer: Medicaid Other | Admitting: Cardiology

## 2022-06-20 ENCOUNTER — Emergency Department (HOSPITAL_COMMUNITY)
Admission: EM | Admit: 2022-06-20 | Discharge: 2022-06-20 | Disposition: A | Discharge status: Home / Self Care | Payer: Medicaid Other | Attending: Emergency Medicine | Admitting: Emergency Medicine

## 2022-06-20 ENCOUNTER — Emergency Department (HOSPITAL_COMMUNITY): Payer: Medicaid Other

## 2022-06-20 ENCOUNTER — Encounter (HOSPITAL_COMMUNITY): Payer: Self-pay

## 2022-06-20 DIAGNOSIS — Z79899 Other long term (current) drug therapy: Secondary | ICD-10-CM | POA: Insufficient documentation

## 2022-06-20 DIAGNOSIS — Z7982 Long term (current) use of aspirin: Secondary | ICD-10-CM | POA: Insufficient documentation

## 2022-06-20 DIAGNOSIS — J101 Influenza due to other identified influenza virus with other respiratory manifestations: Secondary | ICD-10-CM | POA: Insufficient documentation

## 2022-06-20 DIAGNOSIS — I10 Essential (primary) hypertension: Secondary | ICD-10-CM | POA: Diagnosis not present

## 2022-06-20 DIAGNOSIS — Z1152 Encounter for screening for COVID-19: Secondary | ICD-10-CM | POA: Insufficient documentation

## 2022-06-20 DIAGNOSIS — R059 Cough, unspecified: Secondary | ICD-10-CM | POA: Diagnosis present

## 2022-06-20 DIAGNOSIS — J111 Influenza due to unidentified influenza virus with other respiratory manifestations: Secondary | ICD-10-CM

## 2022-06-20 DIAGNOSIS — R5381 Other malaise: Secondary | ICD-10-CM

## 2022-06-20 DIAGNOSIS — Z8673 Personal history of transient ischemic attack (TIA), and cerebral infarction without residual deficits: Secondary | ICD-10-CM | POA: Diagnosis not present

## 2022-06-20 LAB — CBC WITH DIFFERENTIAL/PLATELET
Abs Immature Granulocytes: 0.02 10*3/uL (ref 0.00–0.07)
Basophils Absolute: 0 10*3/uL (ref 0.0–0.1)
Basophils Relative: 0 %
Eosinophils Absolute: 0 10*3/uL (ref 0.0–0.5)
Eosinophils Relative: 0 %
HCT: 30.3 % — ABNORMAL LOW (ref 39.0–52.0)
Hemoglobin: 9.8 g/dL — ABNORMAL LOW (ref 13.0–17.0)
Immature Granulocytes: 0 %
Lymphocytes Relative: 18 %
Lymphs Abs: 1 10*3/uL (ref 0.7–4.0)
MCH: 28.5 pg (ref 26.0–34.0)
MCHC: 32.3 g/dL (ref 30.0–36.0)
MCV: 88.1 fL (ref 80.0–100.0)
Monocytes Absolute: 1 10*3/uL (ref 0.1–1.0)
Monocytes Relative: 17 %
Neutro Abs: 3.8 10*3/uL (ref 1.7–7.7)
Neutrophils Relative %: 65 %
Platelets: 219 10*3/uL (ref 150–400)
RBC: 3.44 MIL/uL — ABNORMAL LOW (ref 4.22–5.81)
RDW: 13.2 % (ref 11.5–15.5)
WBC: 5.8 10*3/uL (ref 4.0–10.5)
nRBC: 0 % (ref 0.0–0.2)

## 2022-06-20 LAB — RESP PANEL BY RT-PCR (RSV, FLU A&B, COVID)  RVPGX2
Influenza A by PCR: NEGATIVE
Influenza B by PCR: POSITIVE — AB
Resp Syncytial Virus by PCR: NEGATIVE
SARS Coronavirus 2 by RT PCR: NEGATIVE

## 2022-06-20 LAB — COMPREHENSIVE METABOLIC PANEL
ALT: 18 U/L (ref 0–44)
AST: 32 U/L (ref 15–41)
Albumin: 2.4 g/dL — ABNORMAL LOW (ref 3.5–5.0)
Alkaline Phosphatase: 51 U/L (ref 38–126)
Anion gap: 9 (ref 5–15)
BUN: 21 mg/dL — ABNORMAL HIGH (ref 6–20)
CO2: 24 mmol/L (ref 22–32)
Calcium: 7.7 mg/dL — ABNORMAL LOW (ref 8.9–10.3)
Chloride: 105 mmol/L (ref 98–111)
Creatinine, Ser: 1.19 mg/dL (ref 0.61–1.24)
GFR, Estimated: >60 mL/min (ref >60)
Glucose, Bld: 117 mg/dL — ABNORMAL HIGH (ref 70–99)
Potassium: 3.6 mmol/L (ref 3.5–5.1)
Sodium: 138 mmol/L (ref 135–145)
Total Bilirubin: 0.3 mg/dL (ref 0.3–1.2)
Total Protein: 5.2 g/dL — ABNORMAL LOW (ref 6.5–8.1)

## 2022-06-20 MED ORDER — SODIUM CHLORIDE 0.9 % IV SOLN: Freq: Once | INTRAVENOUS | Status: AC

## 2022-06-20 NOTE — ED Notes (Signed)
Pt's brother, Arlie Riker, [phone].

## 2022-06-20 NOTE — Discharge Instructions (Addendum)
-  You were seen today because your blood pressure was low in your PCP's office. You were given fluids and had improvement with this.  -Your chest xray and heart readings (EKG) were normal.  -Your labs were normal -Your viral swab did show evidence of influenza and I suspect this is the cause of all your symptoms.  Please rest and stay hydrated and maintain good oral intake.  Please follow-up with your primary doctor.  Please rest and stay isolated.  If any symptoms change or worsen, please turn to the nearest emergency department.

## 2022-06-20 NOTE — ED Provider Notes (Signed)
[facility] DEPT Provider Note   CSN: [id number] Arrival date & time: 06/20/22  0957     History  Chief Complaint  Patient presents with   Hypotension    Harry [name] is a 47 y.o. male HTN, normocytic anemia, hx of stroke, PFO s/p closure 01/2021 here for symptomatic hypotension in PCP office.  Per triage notes appears that patient was hypotensive with EMS arrival around 97Q systolics.  Heart rate was 60 for EMS.  He was given 500 mL bolus in route to ED. He says he was feeling dizzy and lightheaded in PCP office this morning but denies feeling that way beforehand. He says he was coming in for regular checkup but also noted that he has been having cough for the past 3 days.  He denies any diarrhea, vomiting, abdominal pain, tarry stools, hematemesis, hematuria, hemoptysis.  He denies any chest pain currently but says when he coughs he does have some pain in his anterior chest.  He said this morning he took 6 of his home meds.  He says this included amlodipine, metoprolol, aspirin, losartan, atorvastatin, iron.  He says that he has been eating less in the past few days due to illness that he has been drinking his normal amount.  He has been cough for the past 3 days. His brother is sick as well with a cold.    Home Medications Prior to Admission medications   Medication Sig Start Date End Date Taking? Authorizing Provider  amLODipine (NORVASC) 10 MG tablet Take 1 tablet (10 mg total) by mouth daily. 11/03/20   Bailey-Modzik, Delila A, NP  aspirin 325 MG EC tablet Take 1 tablet by mouth daily. 06/03/21   [provider]  atorvastatin (LIPITOR) 80 MG tablet Take 1 tablet (80 mg total) by mouth daily. 11/03/20   Bailey-Modzik, Delila A, NP  calcium carbonate (OS-CAL) 1250 (500 Ca) MG chewable tablet Chew 1 tablet by mouth as needed.    [provider]  citalopram (CELEXA) 20 MG tablet Take 1 tablet (20 mg total) by mouth daily. 11/03/20    Bailey-Modzik, Delila A, NP  clopidogrel (PLAVIX) 75 MG tablet Take 75 mg by mouth daily.    [provider]  ferrous sulfate 325 (65 FE) MG tablet Take 1 tablet by mouth daily.    [provider]  losartan (COZAAR) 25 MG tablet Take 1 tablet (25 mg total) by mouth daily. 12/03/20 11/28/21  Cantwell, Celeste C, PA-C  MAGNESIUM OXIDE 400 PO Take 400 mg by mouth daily.    [provider]  metoprolol succinate (TOPROL-XL) 50 MG 24 hr tablet Take 1 tablet (50 mg total) by mouth daily. Take with or immediately following a meal. 12/03/20 07/29/21  Cantwell, Celeste C, PA-C  ondansetron (ZOFRAN) 4 MG tablet Take 1 tablet by mouth as needed.    [provider]  polyethylene glycol (MIRALAX / GLYCOLAX) 17 g packet Take 17 g by mouth daily as needed (Constipation). 11/02/20   Bailey-Modzik, Delila A, NP  senna-docusate (SENOKOT-S) 8.6-50 MG tablet Take 1 tablet by mouth at bedtime as needed for mild constipation. 11/02/20   Bailey-Modzik, Morene Crocker, NP      Allergies    Patient has no known allergies.    Review of Systems   Review of Systems  Constitutional:  Positive for appetite change. Negative for fever.  HENT:  Positive for congestion.   Respiratory:  Positive for cough. Negative for shortness of breath.   Cardiovascular:  Negative for chest pain.  Gastrointestinal:  Negative for abdominal pain, blood in stool, constipation, diarrhea and vomiting.  Genitourinary:  Negative for dysuria.  Musculoskeletal:  Positive for myalgias.  Skin:  Negative for rash.  Neurological:  Positive for dizziness and light-headedness.    Physical Exam Updated Vital Signs BP 124/80   Pulse 67   Temp 99.3 F (37.4 C) (Oral)   Resp 20   SpO2 99%  Physical Exam Vitals and nursing note reviewed.  Constitutional:      General: He is not in acute distress.    Appearance: He is well-developed.  HENT:     Head: Normocephalic and atraumatic.     Nose: Rhinorrhea present.      Mouth/Throat:     Mouth: Mucous membranes are dry.     Pharynx: Posterior oropharyngeal erythema present. No oropharyngeal exudate.  Eyes:     Conjunctiva/sclera: Conjunctivae normal.  Cardiovascular:     Rate and Rhythm: Normal rate and regular rhythm.     Heart sounds: No murmur heard. Pulmonary:     Effort: Pulmonary effort is normal. No respiratory distress.     Breath sounds: Normal breath sounds.  Abdominal:     General: Abdomen is flat. There is no distension.     Palpations: Abdomen is soft.     Tenderness: There is no abdominal tenderness. There is no guarding or rebound.  Musculoskeletal:        General: No swelling.     Cervical back: Neck supple. No tenderness.     Right lower leg: No edema.     Left lower leg: No edema.  Lymphadenopathy:     Cervical: No cervical adenopathy.  Skin:    General: Skin is warm and dry.  Neurological:     Mental Status: He is alert and oriented to person, place, and time. Mental status is at baseline.     Comments: Baseline with left upper limb weakness from prior stroke.  Psychiatric:        Mood and Affect: Mood normal.     ED Results / Procedures / Treatments   Labs (all labs ordered are listed, but only abnormal results are displayed) Labs Reviewed  CBC WITH DIFFERENTIAL/PLATELET - Abnormal; Notable for the following components:      Result Value   RBC 3.44 (*)    Hemoglobin 9.8 (*)    HCT 30.3 (*)    All other components within normal limits  COMPREHENSIVE METABOLIC PANEL - Abnormal; Notable for the following components:   Glucose, Bld 117 (*)    BUN 21 (*)    Calcium 7.7 (*)    Total Protein 5.2 (*)    Albumin 2.4 (*)    All other components within normal limits  RESP PANEL BY RT-PCR (RSV, FLU A&B, COVID)  [id number]    EKG EKG Interpretation  Date/Time:  Friday June 20 2022 11:02:18 EST Ventricular Rate:  70 PR Interval:  209 QRS Duration: 110 QT Interval:  418 QTC Calculation: 451 R Axis:   221 Text  Interpretation: Sinus rhythm Borderline prolonged PR interval Markedly posterior QRS axis Borderline T wave abnormalities when compared to prior, overall similar appearance. No STEMI Confirmed by Antony Blackbird [id number]) on 06/20/2022 11:04:13 AM  Radiology DG Chest 2 View  Result Date: 06/20/2022 CLINICAL DATA:  Cough and hypotension EXAM: CHEST - 2 VIEW COMPARISON:  Chest x-ray 06/01/2021 FINDINGS: Film is rotated to the left. Slightly elevated right hemidiaphragm. No consolidation, pneumothorax or effusion. No edema.  Intra-atrial septal occlusion device noted. Overlapping cardiac leads. There are several loops of air-filled distended bowel beneath the diaphragm including on the right side. IMPRESSION: Rotated radiograph.  No acute cardiopulmonary disease Electronically Signed   By: Jill Side M.D.   On: 06/20/2022 11:04    Procedures Procedures   Medications Ordered in ED Medications  0.9 %  sodium chloride infusion ( Intravenous New Bag/Given 06/20/22 1103)    ED Course/ Medical Decision Making/ A&P                           Medical Decision Making 47 year old with PMH of hypertension, normocytic anemia, history of stroke sent from PCP office for hypotension.  Patient has been having cold-like symptoms for the past 3 days alongside cough.  Obtained chest x-ray given hypotension and cough which showed no pneumonia or acute issues.  Viral swab was pending before handoff to Dr. Sherry Ruffing.  EKG obtained as well which showed no issues.  Patient has history of anemia so CBC was obtained which showed hemoglobin of 9.8  (baseline 2022 in 8s), and no leukocytosis-unlikely that this is related to severe infection or acute blood loss anemia. CMP showed elevated creatinine of 1.19 (baseline around .7s) likely pre-renal in setting of decreased PO intake.  Blood pressure improved s/p 500 mg bolus in EMS and an additional 500 mL bolus here in ED. Believe most likely hypotension related to acute viral illness and  decreased PO intake during this time alongside patient taking all his antihypertensives in the morning. Improved and remained stable after fluid resuscitation.     Final Clinical Impression(s) / ED Diagnoses Final diagnoses:  None    Rx / DC Orders ED Discharge Orders     None         Gerrit Heck, MD 06/20/22 [place]    Tegeler, Gwenyth Allegra, MD 06/20/22 1445

## 2022-06-20 NOTE — ED Triage Notes (Signed)
Pt presents from a PCP office with c/o hypotension. Pt was at the office for feeling unwell. Pt was hypotensive upon EMS arrival in the 90's, HR of 60 for EMS. Pt is alert and oriented at this time. Pt does have a hx of anemia.

## 2022-06-20 NOTE — ED Notes (Signed)
Pt to finish liter bolus of fluid and then will be ready for discharge per Dr. Sherry Ruffing.

## 2022-11-17 ENCOUNTER — Other Ambulatory Visit: Payer: Self-pay

## 2022-11-17 ENCOUNTER — Emergency Department (HOSPITAL_COMMUNITY)
Admission: EM | Admit: 2022-11-17 | Discharge: 2022-11-17 | Disposition: A | Discharge status: Home / Self Care | Payer: Medicaid Other | Attending: Emergency Medicine | Admitting: Emergency Medicine

## 2022-11-17 ENCOUNTER — Emergency Department (HOSPITAL_COMMUNITY): Payer: Medicaid Other

## 2022-11-17 ENCOUNTER — Encounter (HOSPITAL_COMMUNITY): Payer: Self-pay | Admitting: *Deleted

## 2022-11-17 DIAGNOSIS — I77810 Thoracic aortic ectasia: Secondary | ICD-10-CM | POA: Diagnosis not present

## 2022-11-17 DIAGNOSIS — E041 Nontoxic single thyroid nodule: Secondary | ICD-10-CM | POA: Diagnosis not present

## 2022-11-17 DIAGNOSIS — R079 Chest pain, unspecified: Secondary | ICD-10-CM

## 2022-11-17 HISTORY — DX: Cerebral infarction, unspecified: I63.9

## 2022-11-17 LAB — TROPONIN I (HIGH SENSITIVITY)
Troponin I (High Sensitivity): 15 ng/L (ref <18)
Troponin I (High Sensitivity): 29 ng/L — ABNORMAL HIGH (ref <18)
Troponin I (High Sensitivity): 28 ng/L — ABNORMAL HIGH (ref <18)

## 2022-11-17 LAB — COMPREHENSIVE METABOLIC PANEL
ALT: 29 U/L (ref 0–44)
AST: 33 U/L (ref 15–41)
Albumin: 3.3 g/dL — ABNORMAL LOW (ref 3.5–5.0)
Alkaline Phosphatase: 69 U/L (ref 38–126)
Anion gap: 15 (ref 5–15)
BUN: 10 mg/dL (ref 6–20)
CO2: 24 mmol/L (ref 22–32)
Calcium: 9.4 mg/dL (ref 8.9–10.3)
Chloride: 100 mmol/L (ref 98–111)
Creatinine, Ser: 0.72 mg/dL (ref 0.61–1.24)
GFR, Estimated: >60 mL/min (ref >60)
Glucose, Bld: 151 mg/dL — ABNORMAL HIGH (ref 70–99)
Potassium: 3.2 mmol/L — ABNORMAL LOW (ref 3.5–5.1)
Sodium: 139 mmol/L (ref 135–145)
Total Bilirubin: 1 mg/dL (ref 0.3–1.2)
Total Protein: 6.2 g/dL — ABNORMAL LOW (ref 6.5–8.1)

## 2022-11-17 LAB — D-DIMER, QUANTITATIVE: D-Dimer, Quant: 0.52 ug/mL-FEU — ABNORMAL HIGH (ref 0.00–0.50)

## 2022-11-17 LAB — CBC
HCT: 35.8 % — ABNORMAL LOW (ref 39.0–52.0)
Hemoglobin: 11.8 g/dL — ABNORMAL LOW (ref 13.0–17.0)
MCH: 28.6 pg (ref 26.0–34.0)
MCHC: 33 g/dL (ref 30.0–36.0)
MCV: 86.9 fL (ref 80.0–100.0)
Platelets: 239 10*3/uL (ref 150–400)
RBC: 4.12 MIL/uL — ABNORMAL LOW (ref 4.22–5.81)
RDW: 13.7 % (ref 11.5–15.5)
WBC: 9.8 10*3/uL (ref 4.0–10.5)
nRBC: 0 % (ref 0.0–0.2)

## 2022-11-17 MED ORDER — ALUM & MAG HYDROXIDE-SIMETH 200-200-20 MG/5ML PO SUSP
30.0000 mL | Freq: Once | ORAL | Status: AC
  Administered 2022-11-17: 30 mL via ORAL
  Filled 2022-11-17: qty 30

## 2022-11-17 MED ORDER — IOHEXOL 350 MG/ML SOLN
75.0000 mL | Freq: Once | INTRAVENOUS | Status: AC | PRN
  Administered 2022-11-17: 75 mL via INTRAVENOUS

## 2022-11-17 MED ORDER — ONDANSETRON HCL 4 MG/2ML IJ SOLN
4.0000 mg | Freq: Once | INTRAMUSCULAR | Status: AC
  Administered 2022-11-17: 4 mg via INTRAVENOUS
  Filled 2022-11-17: qty 2

## 2022-11-17 NOTE — ED Provider Notes (Signed)
[place] EMERGENCY DEPARTMENT AT [facility] Provider Note   CSN: [id number] Arrival date & time: 11/17/22  0216     History  Chief Complaint  Patient presents with   Chest Pain    Harry [name] is a 47 y.o. male.  HPI     This 47 year old male who presents with chest pain.  Patient was brought in by EMS.  He has a history of prior stroke with left-sided deficits.  Patient reports he has had chest pain on and off since last night.  Most recently 1 hour ago.  Describes it as dull and nonradiating.  No recent fevers, cough, shortness of breath.  Currently is asymptomatic.  Home Medications Prior to Admission medications   Medication Sig Start Date End Date Taking? Authorizing Provider  amLODipine (NORVASC) 10 MG tablet Take 1 tablet (10 mg total) by mouth daily. 11/03/20   Bailey-Modzik, Delila A, NP  aspirin 325 MG EC tablet Take 1 tablet by mouth daily. 06/03/21   [provider]  atorvastatin (LIPITOR) 80 MG tablet Take 1 tablet (80 mg total) by mouth daily. 11/03/20   Bailey-Modzik, Delila A, NP  calcium carbonate (OS-CAL) 1250 (500 Ca) MG chewable tablet Chew 1 tablet by mouth as needed.    [provider]  citalopram (CELEXA) 20 MG tablet Take 1 tablet (20 mg total) by mouth daily. 11/03/20   Bailey-Modzik, Delila A, NP  clopidogrel (PLAVIX) 75 MG tablet Take 75 mg by mouth daily.    [provider]  ferrous sulfate 325 (65 FE) MG tablet Take 1 tablet by mouth daily.    [provider]  losartan (COZAAR) 25 MG tablet Take 1 tablet (25 mg total) by mouth daily. 12/03/20 11/28/21  Cantwell, Celeste C, PA-C  MAGNESIUM OXIDE 400 PO Take 400 mg by mouth daily.    [provider]  metoprolol succinate (TOPROL-XL) 50 MG 24 hr tablet Take 1 tablet (50 mg total) by mouth daily. Take with or immediately following a meal. 12/03/20 07/29/21  Cantwell, Celeste C, PA-C  ondansetron (ZOFRAN) 4 MG tablet Take 1 tablet by mouth as needed.     [provider]  polyethylene glycol (MIRALAX / GLYCOLAX) 17 g packet Take 17 g by mouth daily as needed (Constipation). 11/02/20   Bailey-Modzik, Delila A, NP  senna-docusate (SENOKOT-S) 8.6-50 MG tablet Take 1 tablet by mouth at bedtime as needed for mild constipation. 11/02/20   Bailey-Modzik, Ward Chatters, NP      Allergies    Patient has no known allergies.    Review of Systems   Review of Systems  Constitutional:  Negative for fever.  Respiratory:  Negative for shortness of breath.   Cardiovascular:  Positive for chest pain.  Gastrointestinal:  Negative for abdominal pain.  All other systems reviewed and are negative.   Physical Exam Updated Vital Signs BP (!) 147/98 (BP Location: Left Arm)   Pulse 99   Temp 99.3 F (37.4 C) (Oral)   Resp 16   Ht 1.702 m (5\' 7" )   Wt 52.2 kg   SpO2 100%   BMI 18.01 kg/m  Physical Exam Vitals and nursing note reviewed.  Constitutional:      Comments: Chronically ill-appearing, nontoxic  HENT:     Head: Normocephalic and atraumatic.  Eyes:     Pupils: Pupils are equal, round, and reactive to light.  Cardiovascular:     Rate and Rhythm: Regular rhythm. Tachycardia present.     Heart sounds: Normal  heart sounds. No murmur heard. Pulmonary:     Effort: Pulmonary effort is normal. No respiratory distress.     Breath sounds: Normal breath sounds. No wheezing.  Abdominal:     General: Bowel sounds are normal.     Palpations: Abdomen is soft.     Tenderness: There is no abdominal tenderness. There is no rebound.  Musculoskeletal:     Cervical back: Neck supple.  Lymphadenopathy:     Cervical: No cervical adenopathy.  Skin:    General: Skin is warm and dry.  Neurological:     Mental Status: He is alert and oriented to person, place, and time.     Comments: Contracture left upper extremity, weakness with grip and distal muscle strength left upper extremity  Psychiatric:        Mood and Affect: Mood normal.     ED Results /  Procedures / Treatments   Labs (all labs ordered are listed, but only abnormal results are displayed) Labs Reviewed  CBC - Abnormal; Notable for the following components:      Result Value   RBC 4.12 (*)    Hemoglobin 11.8 (*)    HCT 35.8 (*)    All other components within normal limits  COMPREHENSIVE METABOLIC PANEL - Abnormal; Notable for the following components:   Potassium 3.2 (*)    Glucose, Bld 151 (*)    Total Protein 6.2 (*)    Albumin 3.3 (*)    All other components within normal limits  D-DIMER, QUANTITATIVE - Abnormal; Notable for the following components:   D-Dimer, Quant 0.52 (*)    All other components within normal limits  TROPONIN I (HIGH SENSITIVITY) - Abnormal; Notable for the following components:   Troponin I (High Sensitivity) 29 (*)    All other components within normal limits  TROPONIN I (HIGH SENSITIVITY)  TROPONIN I (HIGH SENSITIVITY)    EKG EKG Interpretation  Date/Time:  Monday November 17 2022 02:20:18 EDT Ventricular Rate:  108 PR Interval:  188 QRS Duration: 106 QT Interval:  345 QTC Calculation: 463 R Axis:   167 Text Interpretation: Sinus tachycardia Multiple premature complexes, vent & supraven Left posterior fascicular block Borderline T abnormalities, inferior leads Artifact in lead(s) I II III aVR aVL aVF V2 Confirmed by Ross Marcus ([id number]) on 11/17/2022 3:34:15 AM  Radiology CT Angio Chest PE W and/or Wo Contrast  Result Date: 11/17/2022 CLINICAL DATA:  Positive D-dimer with chest pain and hypertension, mild tachycardia. Pulmonary embolism suspected. Intermediate to low probability. EXAM: CT ANGIOGRAPHY CHEST WITH CONTRAST TECHNIQUE: Multidetector CT imaging of the chest was performed using the standard protocol during bolus administration of intravenous contrast. Multiplanar CT image reconstructions and MIPs were obtained to evaluate the vascular anatomy. RADIATION DOSE REDUCTION: This exam was performed according to the departmental  dose-optimization program which includes automated exposure control, adjustment of the mA and/or kV according to patient size and/or use of iterative reconstruction technique. CONTRAST:  75mL OMNIPAQUE IOHEXOL 350 MG/ML SOLN COMPARISON:  Portable chest today, AP Lat chest 06/20/2022, portable chest 06/01/2021, and CTA chest 10/09/2020. FINDINGS: Cardiovascular: The aortic root at the sinuses of Valsalva measures aneurysmal at 4.4 cm. A similar measurement was noted previously. The remainder is within normal caliber limits with mild calcific plaques, scattered calcification in the great vessels. There is no dissection or stenosis. There is mild cardiomegaly again with septal and left ventricular wall hypertrophy in general but with focal thinning at the left ventricular apex consistent with remote ischemia. There  are no venous dilatation. The pulmonary trunk is prominent 3.5 cm indicating arterial hypertension, previously 3.3 cm. There is no evidence of arterial emboli. Small pericardial effusion is again noted and unchanged. Mediastinum/Nodes: There is a 1.7 cm hypodense nodule in the left lobe of the thyroid gland which was previously 1.1 cm. Nonemergent ultrasound follow-up is recommended. There is also a new hypodense nodule in the right lobe measuring 9 mm. There are scattered subcentimeter stable bilateral axillary nodes without intrathoracic adenopathy. There is a small hiatal hernia with unremarkable thoracic esophagus, thoracic trachea. Lungs/Pleura: No pleural effusion, thickening or pneumothorax. The lungs are clear. Upper Abdomen: No acute abnormality. There is a partially visible homogeneous likely cyst in the upper pole of the left kidney, largest measured diameter is 5 cm. Hounsfield density is 9.7, but given only partial inclusion would recommend a nonemergent MRI or CT without and with contrast to better visualize. For reference see JACR 2018 Feb; 264-273, Management of the Incidental Renal Mass on  CT, [employer] 2021; 814-848, Bosniak Classification of Cystic Renal Masses, Version 2019. No other significant upper abdominal findings. 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 No aggressive bone lesions or acute osseous abnormality. Gynecomastia. Review of the MIP images confirms the above findings. IMPRESSION: 1. No acute chest CT or CTA findings. 2. Cardiomegaly with septal and left ventricular wall hypertrophy and focal thinning of the left ventricular apex consistent with remote ischemia. 3. Prominent pulmonary trunk 3.5 cm, previously 3.3 cm. 4. Aortic atherosclerosis and a 4.4 cm in diameter aortic root at the sinuses of Valsalva. Recommend annual imaging followup by CTA or MRA. This recommendation follows 2010 ACCF/AHA/AATS/ACR/ASA/SCA/SCAI/SIR/STS/SVM Guidelines for the Diagnosis and Management of Patients with Thoracic Aortic Disease. Circulation. 2010; 121: Z610-R604. Aortic aneurysm NOS (ICD10-I71.9). 5. 1.7 cm left thyroid nodule, previously 1.1 cm. New 9 mm right thyroid nodule. Nonemergent follow-up ultrasound recommended. 6. Partially visible homogeneous likely cyst in the upper pole of the left kidney, largest measured diameter is 5 cm. Nonemergent MRI or CT without and with contrast is recommended. 7. Small hiatal hernia. 8. Gynecomastia. 9. Thoracic kyphodextroscoliosis and degenerative change. Aortic Atherosclerosis (ICD10-I70.0). Electronically Signed   By: Almira Bar M.D.   On: 11/17/2022 05:14   DG Chest Portable 1 View  Result Date: 11/17/2022 CLINICAL DATA:  Chest pain EXAM: PORTABLE CHEST 1 VIEW COMPARISON:  06/20/2022 FINDINGS: The heart size and mediastinal contours are within normal limits. Both lungs are clear. The visualized skeletal structures are unremarkable. IMPRESSION: No active disease. Electronically Signed   By: Sharlet Salina M.D.   On: 11/17/2022 02:41    Procedures Procedures    Medications Ordered in ED Medications   ondansetron (ZOFRAN) injection 4 mg (has no administration in time range)  iohexol (OMNIPAQUE) 350 MG/ML injection 75 mL (75 mLs Intravenous Contrast Given 11/17/22 0416)    ED Course/ Medical Decision Making/ A&P Clinical Course as of 11/17/22 0744  Mon Nov 17, 2022  5409 Spoke with Dr. Melton Alar, [facility] cardiology.  She recommends follow-up closely in office later this week.  Does not feel he needs admission for serial troponins.  He has been asymptomatic while here in the emergency department but now complaining of nausea.  Will repeat EKG and get one more troponin. [CH]    Clinical Course User Index [CH] Dawnielle Christiana, Mayer Masker, MD                             Medical  Decision Making Amount and/or Complexity of Data Reviewed Labs: ordered. Radiology: ordered.  Risk Prescription drug management.   This patient presents to the ED for concern of chest pain, this involves an extensive number of treatment options, and is a complaint that carries with it a high risk of complications and morbidity.  I considered the following differential and admission for this acute, potentially life threatening condition.  The differential diagnosis includes ACS, PE, pneumothorax, pneumonia  MDM:    This is a 47 year old male who presents with chest pain.  He is overall nontoxic.  He is chronically ill-appearing.  History of prior stroke.  Chest pain is atypical.  Currently chest pain-free.  Initial EKG without acute ischemic or arrhythmic changes.  Initial troponin 15.  Labs are overall reassuring.  Mild hypokalemia to 3.2.  Chest x-ray without pneumothorax or pneumonia.  He was mildly tachycardic so D-dimer was sent.  This was slightly elevated and a CT PE study was obtained without evidence of PE.  Noted several incidental findings including a thyroid nodule and a dilated aortic root.  See clinical course above.  Repeat troponin 29.  Discussed with cardiology.  On recheck, patient had some persistent nausea.   Repeat EKG shows no evidence of acute ischemia.  Will repeat 1 more troponin.  If not rising, feel he can follow-up as an outpatient as planned with cardiology.  However, if rising, will need to reengage cardiology.  Signed out to Dr. Wallace Cullens.  (Labs, imaging, consults)  Labs: I Ordered, and personally interpreted labs.  The pertinent results include: CBC, CMP, troponin x 2  Imaging Studies ordered: I ordered imaging studies including chest x-ray, CT PE study I independently visualized and interpreted imaging. I agree with the radiologist interpretation  Additional history obtained from chart review.  External records from outside source obtained and reviewed including evaluations  Cardiac Monitoring: The patient was maintained on a cardiac monitor.  If on the cardiac monitor, I personally viewed and interpreted the cardiac monitored which showed an underlying rhythm of: Sinus rhythm  Reevaluation: After the interventions noted above, I reevaluated the patient and found that they have :stayed the same  Social Determinants of Health:  disabled  Disposition: Pending repeat troponin  Co morbidities that complicate the patient evaluation  Past Medical History:  Diagnosis Date   Hypertension    PFO (patent foramen ovale)    Stroke (HCC)      Medicines Meds ordered this encounter  Medications   iohexol (OMNIPAQUE) 350 MG/ML injection 75 mL   ondansetron (ZOFRAN) injection 4 mg    I have reviewed the patients home medicines and have made adjustments as needed  Problem List / ED Course: Problem List Items Addressed This Visit   None Visit Diagnoses     Thyroid nodule    -  Primary   Dilated aortic root (HCC)                       Final Clinical Impression(s) / ED Diagnoses Final diagnoses:  Thyroid nodule  Dilated aortic root (HCC)    Rx / DC Orders ED Discharge Orders     None         Shon Baton, MD 11/17/22 [phone]

## 2022-11-17 NOTE — ED Notes (Signed)
Patient reporting he does not feel comofortable going home. MD made aware and is going to speak with patient.

## 2022-11-17 NOTE — ED Triage Notes (Signed)
Pt arrives via [facility] from home. Called out for CP, 1 hour "dull, comes and goes". Pain 2/10, 142/96, hr 120, PVCs on the monitor. Previous stroke, left sided deficits. 324 ASA, iv established in the right hand.

## 2022-11-17 NOTE — Discharge Instructions (Addendum)
   You were incidentally found to have a thyroid nodule.  You need to have ultrasound and imaging as an outpatient.  Additionally, you should have surveillance CT imaging of your aorta.  You should follow-up with your primary doctor to have these scheduled.  Please follow up with your cardiologist for recheck   It was a pleasure caring for you today in the emergency department.  Please return to the emergency department for any worsening or worrisome symptoms.

## 2022-11-17 NOTE — ED Provider Notes (Signed)
  Provider Note MRN:  [id number]  Arrival date & time: 11/17/22    ED Course and Medical Decision Making  Assumed care from C Horton at shift change.  See note from prior team for complete details, in brief:  47 yo male Hx prior cva/pfo F/w [facility] Trop 15 > 29 Pain free now Prior team spoke w/ Dr Melton Alar; if remains pain free and trop flat can f/u in clinc Rpt EKG w/ pvc's, rpt trop pending Plan per prior team: If rpt trop is increased call back cardiology   Clinical Course as of 11/17/22 1012  Mon Nov 17, 2022  0727 Spoke with Dr. Melton Alar, [facility] cardiology.  She recommends follow-up closely in office later this week.  Does not feel he needs admission for serial troponins.  He has been asymptomatic while here in the emergency department but now complaining of nausea.  Will repeat EKG and get one more troponin. [CH]  1003 Delta trop down-trending > 28 [SG]    Clinical Course User Index [CH] Horton, Mayer Masker, MD [SG] Tanda Rockers A, DO     10:12 AM On recheck he is feeling better, no ongoing chest pain or n/v Tolerating PO Rpt trop downtrending Stable for dc with close f/u with cardiology and pcp  The patient improved significantly and was discharged in stable condition. Detailed discussions were had with the patient regarding current findings, and need for close f/u with PCP or on call doctor. The patient has been instructed to return immediately if the symptoms worsen in any way for re-evaluation. Patient verbalized understanding and is in agreement with current care plan. All questions answered prior to discharge.      Procedures  Final Clinical Impressions(s) / ED Diagnoses     ICD-10-CM   1. Thyroid nodule  E04.1     2. Dilated aortic root (HCC)  I77.810     3. Chest pain, unspecified type  R07.9       ED Discharge Orders     None         Discharge Instructions        You were incidentally found to have a thyroid nodule.  You need  to have ultrasound and imaging as an outpatient.  Additionally, you should have surveillance CT imaging of your aorta.  You should follow-up with your primary doctor to have these scheduled.  Please follow up with your cardiologist for recheck   It was a pleasure caring for you today in the emergency department.  Please return to the emergency department for any worsening or worrisome symptoms.          Tanda Rockers A, DO 11/17/22 1013

## 2022-11-18 ENCOUNTER — Ambulatory Visit: Payer: Medicaid Other | Admitting: Cardiology

## 2022-11-18 NOTE — Progress Notes (Signed)
No show     Primary Physician/Referring:  Norm Salt, PA  Patient ID: Harry [name], male    DOB: Jul 18, 1975, 47 y.o.   MRN: [id number]  No chief complaint on file.  HPI:    Harry [name]  is a 47 y.o. male with history of hypertension, hyperlipidemia, non-ischemic cardiomyopathy likely secondary to untreated hypertension, right MCA stroke SP PFO closure on 01/22/2021, last episode of altered mental status, possible seizure, hypertensive urgency in December 2022 post COVID infection, felt to be probably not related to seizure disorder and Keppra was discontinued.  This is a post ED visit, presented with chest pain, ruled out for ACS and discharged home on 11/17/2022 30 yesterday.      Past Medical History:  Diagnosis Date   Hypertension    PFO (patent foramen ovale)    Stroke [facility])    Past Surgical History:  Procedure Laterality Date   BUBBLE STUDY  10/08/2020   Procedure: BUBBLE STUDY;  Surgeon: Quintella Reichert, MD;  Location: [facility] ENDOSCOPY;  Service: Cardiovascular;;   PATENT FORAMEN OVALE(PFO) CLOSURE N/A 01/22/2021   Procedure: PATENT FORAMEN OVALE (PFO) CLOSURE;  Surgeon: Yates Decamp, MD;  Location: [facility] INVASIVE CV LAB;  Service: Cardiovascular;  Laterality: N/A;   RIGHT HEART CATH N/A 01/22/2021   Procedure: RIGHT HEART CATH;  Surgeon: Yates Decamp, MD;  Location: [facility] INVASIVE CV LAB;  Service: Cardiovascular;  Laterality: N/A;   TEE WITHOUT CARDIOVERSION N/A 10/08/2020   Procedure: TRANSESOPHAGEAL ECHOCARDIOGRAM (TEE);  Surgeon: Quintella Reichert, MD;  Location: [facility] ENDOSCOPY;  Service: Cardiovascular;  Laterality: N/A;   Family History  Problem Relation Age of Onset   Cervical cancer Mother    Stroke Father    Heart Problems Sister        died from blood clot   Diabetes Brother     Social History   Tobacco Use   Smoking status: Never   Smokeless tobacco: Never  Substance Use Topics   Alcohol use: Never   Marital Status: Single  ROS  ROS Objective       11/17/2022   10:00 AM 11/17/2022    9:00 AM 11/17/2022    8:45 AM  Vitals with BMI  Systolic 139 137 409  Diastolic 90 82 90  Pulse 75 73 73   There were no vitals taken for this visit.    Physical Exam  Laboratory examination:   Recent Labs    06/20/22 1038 11/17/22 0224  NA 138 139  K 3.6 3.2*  CL 105 100  CO2 24 24  GLUCOSE 117* 151*  BUN 21* 10  CREATININE 1.19 0.72  CALCIUM 7.7* 9.4  GFRNONAA >60 >60    Lab Results  Component Value Date   GLUCOSE 151 (H) 11/17/2022   NA 139 11/17/2022   K 3.2 (L) 11/17/2022   CL 100 11/17/2022   CO2 24 11/17/2022   BUN 10 11/17/2022   CREATININE 0.72 11/17/2022   GFRNONAA >60 11/17/2022   CALCIUM 9.4 11/17/2022   PHOS 3.2 10/09/2020   PROT 6.2 (L) 11/17/2022   ALBUMIN 3.3 (L) 11/17/2022   BILITOT 1.0 11/17/2022   ALKPHOS 69 11/17/2022   AST 33 11/17/2022   ALT 29 11/17/2022   ANIONGAP 15 11/17/2022      Lab Results  Component Value Date   ALT 29 11/17/2022   AST 33 11/17/2022   ALKPHOS 69 11/17/2022   BILITOT 1.0 11/17/2022       Latest Ref Rng &  Units 11/17/2022    2:24 AM 06/20/2022   10:38 AM 10/13/2020    2:45 AM  Hepatic Function  Total Protein 6.5 - 8.1 g/dL 6.2  5.2  4.2   Albumin 3.5 - 5.0 g/dL 3.3  2.4  1.4   AST 15 - 41 U/L 33  32  28   ALT 0 - 44 U/L 29  18  17    Alk Phosphatase 38 - 126 U/L 69  51  51   Total Bilirubin 0.3 - 1.2 mg/dL 1.0  0.3  0.4     Lipid Panel No results for input(s): "CHOL", "TRIG", "LDLCALC", "VLDL", "HDL", "CHOLHDL", "LDLDIRECT" in the last 8760 hours.  HEMOGLOBIN A1C Lab Results  Component Value Date   HGBA1C 5.3 10/02/2020   MPG 105.41 10/02/2020   TSH No results for input(s): "TSH" in the last 8760 hours.  External labs:     Radiology:   CT angiogram chest 11/17/2022: Cardiovascular: The aortic root at the sinuses of Valsalva measures aneurysmal at 4.4 cm. The remainder is within normal caliber limits with mild calcific plaques, scattered calcification in the  great vessels. There is no dissection or stenosis. 2. There is mild cardiomegaly again with septal and left ventricular wall hypertrophy in general but with focal thinning at the left ventricular apex consistent with remote ischemia.  No pericardial effusion is noted.  No pulmonary embolism. 3.  Prominent pulmonary trunk, 3.5 cm, previously 3.3 compared to 06/20/2022. 4. 1.7 cm left thyroid nodule, previously 1.1 cm. New 9 mm right thyroid nodule. Nonemergent follow-up ultrasound recommended. 5. Gynecomastia.   Cardiac Studies:   Echocardiogram 10/02/2020:  1. Left ventricular ejection fraction, by estimation, is 40 to 45%. The left ventricle has mildly decreased function. The left ventricle demonstrates global hypokinesis. There is mild concentric left ventricular hypertrophy. Left ventricular diastolic parameters are consistent with Grade I diastolic dysfunction (impaired relaxation). Elevated left atrial pressure.  2. Right ventricular systolic function is normal. The right ventricular size is normal.  3. The pericardial effusion is circumferential. There is no evidence of cardiac tamponade.  4. The mitral valve is normal in structure. Trivial mitral valve regurgitation. No evidence of mitral stenosis.  5. The aortic valve is normal in structure. Aortic valve regurgitation is trivial. No aortic stenosis is present.  6. Aortic dilatation noted. There is mild dilatation of the aortic root, measuring 44 mm. There is mild dilatation of the ascending aorta, measuring 42 mm.  7. The inferior vena cava is normal in size with greater than 50% respiratory variability, suggesting right atrial pressure of 3 mmHg.   Ambulatory cardiac telemetry 12/03/2020- - 12/16/2020: Predominant rhythm was sinus with first-degree AV block.  Minimum heart rate 45 bpm, maximum heart rate 120 bpm, average heart rate 65 bpm.  There were no patient triggered events.  Patient had single episode of supraventricular tachycardia  lasting 6 beats.  Rare PACs and PVCs.  Ventricular trigeminy was present.  No evidence of ventricular tachycardia, pauses >3 seconds, high degree AV block, or atrial fibrillation.  Atrial septal defect repair 01/22/2021: There was a moderate sized PFO with ASD anatomy, successful repair with implantation of a 10 mm Amplatzer ASO device.  Neck Strongly positive double contrast study prior to the procedure, weakly positive double contrast study postprocedure suggestive of residual right to left shunting. Normal right heart pressures.  Normal cardiac output and cardiac index.  Qp/Qs normal.  No significant (right to left) shunting.  External echocardiogram 06/03/2021: The left ventricular size  is normal. LV ejection fraction = 60-65%. There is moderate concentric left ventricular hypertrophy.  Injection of agitated saline showed no right-to-left shunt.  Trace to mild pulmonic valvular regurgitation.  There is small size pericardial effusion.  Compared to 10/02/2020, LVEF was previously noted to be 40 to 45%.  There was mild aortic root and ascending aortic dilatation at 42 mm.  EKG:   EKG 11/17/2022: Normal sinus rhythm at rate of 73 bpm, right axis deviation, left posterior fascicular block.  Diffuse nonspecific anterolateral T wave abnormality.  Compared to 06/20/2022, T wave abnormality appears slightly more prominent.  Medications and allergies  No Known Allergies   Medication list   Current Outpatient Medications:    amLODipine (NORVASC) 10 MG tablet, Take 1 tablet (10 mg total) by mouth daily., Disp: , Rfl:    aspirin 325 MG EC tablet, Take 1 tablet by mouth daily., Disp: , Rfl:    atorvastatin (LIPITOR) 80 MG tablet, Take 1 tablet (80 mg total) by mouth daily., Disp: , Rfl:    calcium carbonate (OS-CAL) 1250 (500 Ca) MG chewable tablet, Chew 1 tablet by mouth as needed., Disp: , Rfl:    citalopram (CELEXA) 20 MG tablet, Take 1 tablet (20 mg total) by mouth daily., Disp: , Rfl:     clopidogrel (PLAVIX) 75 MG tablet, Take 75 mg by mouth daily., Disp: , Rfl:    ferrous sulfate 325 (65 FE) MG tablet, Take 1 tablet by mouth daily., Disp: , Rfl:    losartan (COZAAR) 25 MG tablet, Take 1 tablet (25 mg total) by mouth daily., Disp: 90 tablet, Rfl: 3   MAGNESIUM OXIDE 400 PO, Take 400 mg by mouth daily., Disp: , Rfl:    metoprolol succinate (TOPROL-XL) 50 MG 24 hr tablet, Take 1 tablet (50 mg total) by mouth daily. Take with or immediately following a meal., Disp: 90 tablet, Rfl: 3   ondansetron (ZOFRAN) 4 MG tablet, Take 1 tablet by mouth as needed., Disp: , Rfl:    polyethylene glycol (MIRALAX / GLYCOLAX) 17 g packet, Take 17 g by mouth daily as needed (Constipation)., Disp: 14 each, Rfl: 0   senna-docusate (SENOKOT-S) 8.6-50 MG tablet, Take 1 tablet by mouth at bedtime as needed for mild constipation., Disp: , Rfl:   Assessment  No diagnosis found.   No orders of the defined types were placed in this encounter.   No orders of the defined types were placed in this encounter.   There are no discontinued medications.   Recommendations:   Harry [name] is a 47 y.o.  male with history of hypertension, hyperlipidemia, non-ischemic cardiomyopathy likely secondary to untreated hypertension, right MCA stroke SP PFO closure on 01/22/2021, last episode of altered mental status, possible seizure, hypertensive urgency in December 2022 post COVID infection, felt to be probably not related to seizure disorder and Keppra was discontinued.      Yates Decamp, MD, [facility] 11/18/2022, 7:39 AM Office: [phone]

## 2022-11-21 ENCOUNTER — Other Ambulatory Visit: Payer: Self-pay | Admitting: Physician Assistant

## 2022-11-21 DIAGNOSIS — E041 Nontoxic single thyroid nodule: Secondary | ICD-10-CM

## 2022-11-24 ENCOUNTER — Ambulatory Visit
Admission: RE | Admit: 2022-11-24 | Discharge: 2022-11-24 | Disposition: A | Discharge status: Home / Self Care | Payer: Medicaid Other | Source: Ambulatory Visit | Attending: Physician Assistant | Admitting: Physician Assistant

## 2022-11-24 DIAGNOSIS — E041 Nontoxic single thyroid nodule: Secondary | ICD-10-CM

## 2022-11-26 ENCOUNTER — Ambulatory Visit: Payer: Medicaid Other | Admitting: Cardiology

## 2022-11-26 ENCOUNTER — Encounter: Payer: Self-pay | Admitting: Cardiology

## 2022-11-26 VITALS — BP 135/76 | HR 65 | Resp 16 | Ht 67.0 in | Wt 108.6 lb

## 2022-11-26 DIAGNOSIS — E7800 Pure hypercholesterolemia, unspecified: Secondary | ICD-10-CM

## 2022-11-26 DIAGNOSIS — I10 Essential (primary) hypertension: Secondary | ICD-10-CM

## 2022-11-26 DIAGNOSIS — R072 Precordial pain: Secondary | ICD-10-CM

## 2022-11-26 DIAGNOSIS — I7121 Aneurysm of the ascending aorta, without rupture: Secondary | ICD-10-CM

## 2022-11-26 DIAGNOSIS — Q2543 Congenital aneurysm of aorta: Secondary | ICD-10-CM

## 2022-11-26 DIAGNOSIS — Z8673 Personal history of transient ischemic attack (TIA), and cerebral infarction without residual deficits: Secondary | ICD-10-CM

## 2022-11-26 MED ORDER — DIAPERS & SUPPLIES MISC
1.0000 | Freq: Three times a day (TID) | 3 refills | Status: AC
Start: 2022-11-26 — End: ?

## 2022-11-26 NOTE — Progress Notes (Signed)
Primary Physician/Referring:  Norm Salt, PA  Patient ID: Harry [name], male    DOB: 1976-01-06, 47 y.o.   MRN: [id number]  Chief Complaint  Patient presents with   Hypertension   Precordial pain   Hospitalization Follow-up   HPI:    Harry [name]  is a 47 y.o. male with history of hypertension, hyperlipidemia, non-ischemic cardiomyopathy likely secondary to untreated hypertension, right MCA stroke SP PFO closure on 01/22/2021, last episode of altered mental status, possible seizure, hypertensive urgency in December 2022 post COVID infection, felt to be probably not related to seizure disorder and Keppra was discontinued.  This is a post ED visit, presented with chest pain, ruled out for ACS and discharged home on 11/17/2022.  Patient described pain as epigastric pain focal, dull ongoing for a day and hence presented to the emergency room.  He was ruled out for myocardial infarction.  He has not had any recurrence.  Except for continued weakness in his left upper extremity, he has no specific complaints.  He is accompanied by his brother.  Past Medical History:  Diagnosis Date   Hypertension    PFO (patent foramen ovale)    Stroke [facility])    Past Surgical History:  Procedure Laterality Date   BUBBLE STUDY  10/08/2020   Procedure: BUBBLE STUDY;  Surgeon: Quintella Reichert, MD;  Location: [facility] ENDOSCOPY;  Service: Cardiovascular;;   PATENT FORAMEN OVALE(PFO) CLOSURE N/A 01/22/2021   Procedure: PATENT FORAMEN OVALE (PFO) CLOSURE;  Surgeon: Yates Decamp, MD;  Location: [facility] INVASIVE CV LAB;  Service: Cardiovascular;  Laterality: N/A;   RIGHT HEART CATH N/A 01/22/2021   Procedure: RIGHT HEART CATH;  Surgeon: Yates Decamp, MD;  Location: [facility] INVASIVE CV LAB;  Service: Cardiovascular;  Laterality: N/A;   TEE WITHOUT CARDIOVERSION N/A 10/08/2020   Procedure: TRANSESOPHAGEAL ECHOCARDIOGRAM (TEE);  Surgeon: Quintella Reichert, MD;  Location: [facility] ENDOSCOPY;  Service: Cardiovascular;  Laterality: N/A;    Family History  Problem Relation Age of Onset   Cervical cancer Mother    Stroke Father    Heart Problems Sister        died from blood clot   Diabetes Brother     Social History   Tobacco Use   Smoking status: Never   Smokeless tobacco: Never  Substance Use Topics   Alcohol use: Never   Marital Status: Single  ROS  Review of Systems  Cardiovascular:  Negative for chest pain, dyspnea on exertion and leg swelling.   Objective      11/26/2022    9:43 AM 11/17/2022   10:00 AM 11/17/2022    9:00 AM  Vitals with BMI  Height 5\' 7"     Weight 108 lbs 10 oz    BMI 17.01    Systolic 135 139 409  Diastolic 76 90 82  Pulse 65 75 73   There were no vitals taken for this visit.   Physical Exam Neck:     Vascular: No carotid bruit or JVD.  Cardiovascular:     Rate and Rhythm: Normal rate and regular rhythm.     Pulses: Intact distal pulses.     Heart sounds: Normal heart sounds. No murmur heard.    No gallop.  Pulmonary:     Effort: Pulmonary effort is normal.     Breath sounds: Normal breath sounds.  Abdominal:     General: Bowel sounds are normal.     Palpations: Abdomen is soft.  Musculoskeletal:  Right lower leg: No edema.     Left lower leg: No edema.  Neurological:     Mental Status: Mental status is at baseline.     Laboratory examination:   Recent Labs    06/20/22 1038 11/17/22 0224  NA 138 139  K 3.6 3.2*  CL 105 100  CO2 24 24  GLUCOSE 117* 151*  BUN 21* 10  CREATININE 1.19 0.72  CALCIUM 7.7* 9.4  GFRNONAA >60 >60    Lab Results  Component Value Date   GLUCOSE 151 (H) 11/17/2022   NA 139 11/17/2022   K 3.2 (L) 11/17/2022   CL 100 11/17/2022   CO2 24 11/17/2022   BUN 10 11/17/2022   CREATININE 0.72 11/17/2022   GFRNONAA >60 11/17/2022   CALCIUM 9.4 11/17/2022   PHOS 3.2 10/09/2020   PROT 6.2 (L) 11/17/2022   ALBUMIN 3.3 (L) 11/17/2022   BILITOT 1.0 11/17/2022   ALKPHOS 69 11/17/2022   AST 33 11/17/2022   ALT 29 11/17/2022    ANIONGAP 15 11/17/2022      Lab Results  Component Value Date   ALT 29 11/17/2022   AST 33 11/17/2022   ALKPHOS 69 11/17/2022   BILITOT 1.0 11/17/2022       Latest Ref Rng & Units 11/17/2022    2:24 AM 06/20/2022   10:38 AM 10/13/2020    2:45 AM  Hepatic Function  Total Protein 6.5 - 8.1 g/dL 6.2  5.2  4.2   Albumin 3.5 - 5.0 g/dL 3.3  2.4  1.4   AST 15 - 41 U/L 33  32  28   ALT 0 - 44 U/L 29  18  17    Alk Phosphatase 38 - 126 U/L 69  51  51   Total Bilirubin 0.3 - 1.2 mg/dL 1.0  0.3  0.4       Latest Ref Rng & Units 11/17/2022    2:24 AM 06/20/2022   10:38 AM 01/22/2021   11:37 AM  CBC  WBC 4.0 - 10.5 K/uL 9.8  5.8    Hemoglobin 13.0 - 17.0 g/dL 32.4  9.8  8.5   Hematocrit 39.0 - 52.0 % 35.8  30.3  25.0   Platelets 150 - 400 K/uL 239  219      Lab Results  Component Value Date   CHOL 297 (H) 10/02/2020   HDL 73 10/02/2020   LDLCALC 212 (H) 10/02/2020   TRIG 62 10/02/2020   CHOLHDL 4.1 10/02/2020    HEMOGLOBIN A1C Lab Results  Component Value Date   HGBA1C 5.3 10/02/2020   MPG 105.41 10/02/2020   Lab Results  Component Value Date   TSH 1.055 10/06/2020    External labs:   Labs 06/27/2021:  TSH normal at 3.06.  A1c 5.7%.  Total cholesterol 138, triglycerides 91, HDL 50, LDL 71.  Non-HDL cholesterol 88.  Radiology:   CT angiogram chest 11/17/2022: Cardiovascular: The aortic root at the sinuses of Valsalva measures aneurysmal at 4.4 cm. The remainder is within normal caliber limits with mild calcific plaques, scattered calcification in the great vessels. There is no dissection or stenosis. 2. There is mild cardiomegaly again with septal and left ventricular wall hypertrophy in general but with focal thinning at the left ventricular apex consistent with remote ischemia.  No pericardial effusion is noted.  No pulmonary embolism. 3.  Prominent pulmonary trunk, 3.5 cm, previously 3.3 compared to 06/20/2022. 4. 1.7 cm left thyroid nodule, previously 1.1 cm. New 9 mm  right  thyroid nodule. Nonemergent follow-up ultrasound recommended. 5. Gynecomastia.   Cardiac Studies:   Ambulatory cardiac telemetry 12/03/2020- - 12/16/2020: Predominant rhythm was sinus with first-degree AV block.  Minimum heart rate 45 bpm, maximum heart rate 120 bpm, average heart rate 65 bpm.  There were no patient triggered events.  Patient had single episode of supraventricular tachycardia lasting 6 beats.  Rare PACs and PVCs.  Ventricular trigeminy was present.  No evidence of ventricular tachycardia, pauses >3 seconds, high degree AV block, or atrial fibrillation.  Atrial septal defect repair 01/22/2021: There was a moderate sized PFO with ASD anatomy, successful repair with implantation of a 10 mm Amplatzer ASO device.  Neck Strongly positive double contrast study prior to the procedure, weakly positive double contrast study postprocedure suggestive of residual right to left shunting. Normal right heart pressures.  Normal cardiac output and cardiac index.  Qp/Qs normal.  No significant (right to left) shunting.  External echocardiogram 06/03/2021: The left ventricular size is normal. LV ejection fraction = 60-65%. There is moderate concentric left ventricular hypertrophy.  Injection of agitated saline showed no right-to-left shunt.  Trace to mild pulmonic valvular regurgitation.  There is small size pericardial effusion.  Compared to 10/02/2020, LVEF was previously noted to be 40 to 45%.  There was mild aortic root and ascending aortic dilatation at 42 mm.  EKG:   EKG 11/18/2022: Sinus rhythm with first-degree AV block at the rate of 63 bpm, left axis deviation, left intrafascicular block.  No evidence of ischemia, normal QT interval.  Compared to 07/29/2021, First-degree AV block new,   Nonspecific T wave flattening no longer present.  Medications and allergies  No Known Allergies   Medication list   Current Outpatient Medications:    amLODipine (NORVASC) 10 MG tablet, Take 1 tablet  (10 mg total) by mouth daily. (Patient taking differently: Take 5 mg by mouth daily.), Disp: , Rfl:    aspirin 325 MG EC tablet, Take 1 tablet by mouth daily., Disp: , Rfl:    atorvastatin (LIPITOR) 80 MG tablet, Take 1 tablet (80 mg total) by mouth daily., Disp: , Rfl:    calcium carbonate (OS-CAL) 1250 (500 Ca) MG chewable tablet, Chew 1 tablet by mouth as needed., Disp: , Rfl:    citalopram (CELEXA) 20 MG tablet, Take 1 tablet (20 mg total) by mouth daily., Disp: , Rfl:    Diapers & Supplies MISC, 1 each by Does not apply route in the morning, at noon, and at bedtime., Disp: 270 each, Rfl: 3   ferrous sulfate 325 (65 FE) MG tablet, Take 1 tablet by mouth daily., Disp: , Rfl:    MAGNESIUM OXIDE 400 PO, Take 400 mg by mouth daily., Disp: , Rfl:    ondansetron (ZOFRAN) 4 MG tablet, Take 1 tablet by mouth as needed., Disp: , Rfl:    losartan (COZAAR) 25 MG tablet, Take 1 tablet (25 mg total) by mouth daily., Disp: 90 tablet, Rfl: 3   metoprolol succinate (TOPROL-XL) 50 MG 24 hr tablet, Take 1 tablet (50 mg total) by mouth daily. Take with or immediately following a meal., Disp: 90 tablet, Rfl: 3  Assessment     ICD-10-CM   1. Precordial pain  R07.2 PCV MYOCARDIAL PERFUSION WITH LEXISCAN    2. Aneurysm of ascending aorta without rupture (HCC)  I71.21 PCV ECHOCARDIOGRAM COMPLETE    3. Sinus of Valsalva aneurysm  Q25.43 EKG 12-Lead    PCV ECHOCARDIOGRAM COMPLETE    4. Hypercholesteremia  E78.00  5. Primary hypertension  I10     6. History of stroke - with residual left hemiplegia  Z86.73 PCV MYOCARDIAL PERFUSION WITH LEXISCAN    Diapers & Supplies MISC       Orders Placed This Encounter  Procedures   PCV MYOCARDIAL PERFUSION WITH LEXISCAN    Standing Status:   Future    Standing Expiration Date:   01/26/2023   EKG 12-Lead   PCV ECHOCARDIOGRAM COMPLETE    Standing Status:   Future    Standing Expiration Date:   11/26/2023   Meds ordered this encounter  Medications   Diapers &  Supplies MISC    Sig: 1 each by Does not apply route in the morning, at noon, and at bedtime.    Dispense:  270 each    Refill:  3   Medications Discontinued During This Encounter  Medication Reason   senna-docusate (SENOKOT-S) 8.6-50 MG tablet    polyethylene glycol (MIRALAX / GLYCOLAX) 17 g packet    clopidogrel (PLAVIX) 75 MG tablet Completed Course     Recommendations:   Lex WILSIE CIRELLI is a 47 y.o.  male with history of hypertension, hyperlipidemia, non-ischemic cardiomyopathy likely secondary to untreated hypertension, right MCA stroke SP PFO closure on 01/22/2021, last episode of altered mental status, possible seizure, hypertensive urgency in December 2022 post COVID infection, felt to be probably not related to seizure disorder and Keppra was discontinued.  1. Precordial pain Patient chest pain appears to be clearly most skeletal however patient has never had cardiac evaluation with regard to risk stratification and ischemic workup and his mild decrease in LVEF was presumed to be secondary to nonischemic cardiomyopathy.  However recent CT scan that was performed in the emergency room reveals left ventricular apical thinning suggestive of remote ischemia or infarct.  Hence we will schedule him for a Lexiscan nuclear stress test. - PCV MYOCARDIAL PERFUSION WITH LEXISCAN; Future  2. Aneurysm of ascending aorta without rupture (HCC) I reviewed the CT scan, sinus of Valsalva aneurysm and ascending aortic aneurysm appears to be very stable, will cancel his echocardiogram to be done now and scheduled for next year prior to his next office visit. - PCV ECHOCARDIOGRAM COMPLETE; Future  3. Sinus of Valsalva aneurysm Same as above - EKG 12-Lead - PCV ECHOCARDIOGRAM COMPLETE; Future  4. Hypercholesteremia Present under control  5. Primary hypertension Normal blood pressure, presently on ARB and also low-dose beta-blocker and amlodipine continue the same.  6. History of stroke - with  residual left hemiplegia History of stroke, he wants diapers, I sent the supplies.  Unless nuclear stress test is markedly abnormal I will see him back on annual basis.  I reassured him.  I have discussed with his brother and patient regarding the symptoms of angina pectoris.  Discussed musculoskeletal chest pain as well. - PCV MYOCARDIAL PERFUSION WITH LEXISCAN; Future - Diapers & Supplies MISC; 1 each by Does not apply route in the morning, at noon, and at bedtime.  Dispense: 270 each; Refill:    Yates Decamp, MD, [facility] 11/26/2022, 10:16 AM Office: [phone]

## 2022-11-29 IMAGING — DX DG CHEST 1V PORT
1 series · 1 of 1 positions shown · non-contrast
Comparison: 10/04/2020

CLINICAL DATA: Fevers.

EXAM:
PORTABLE CHEST 1 VIEW

[chest ap]
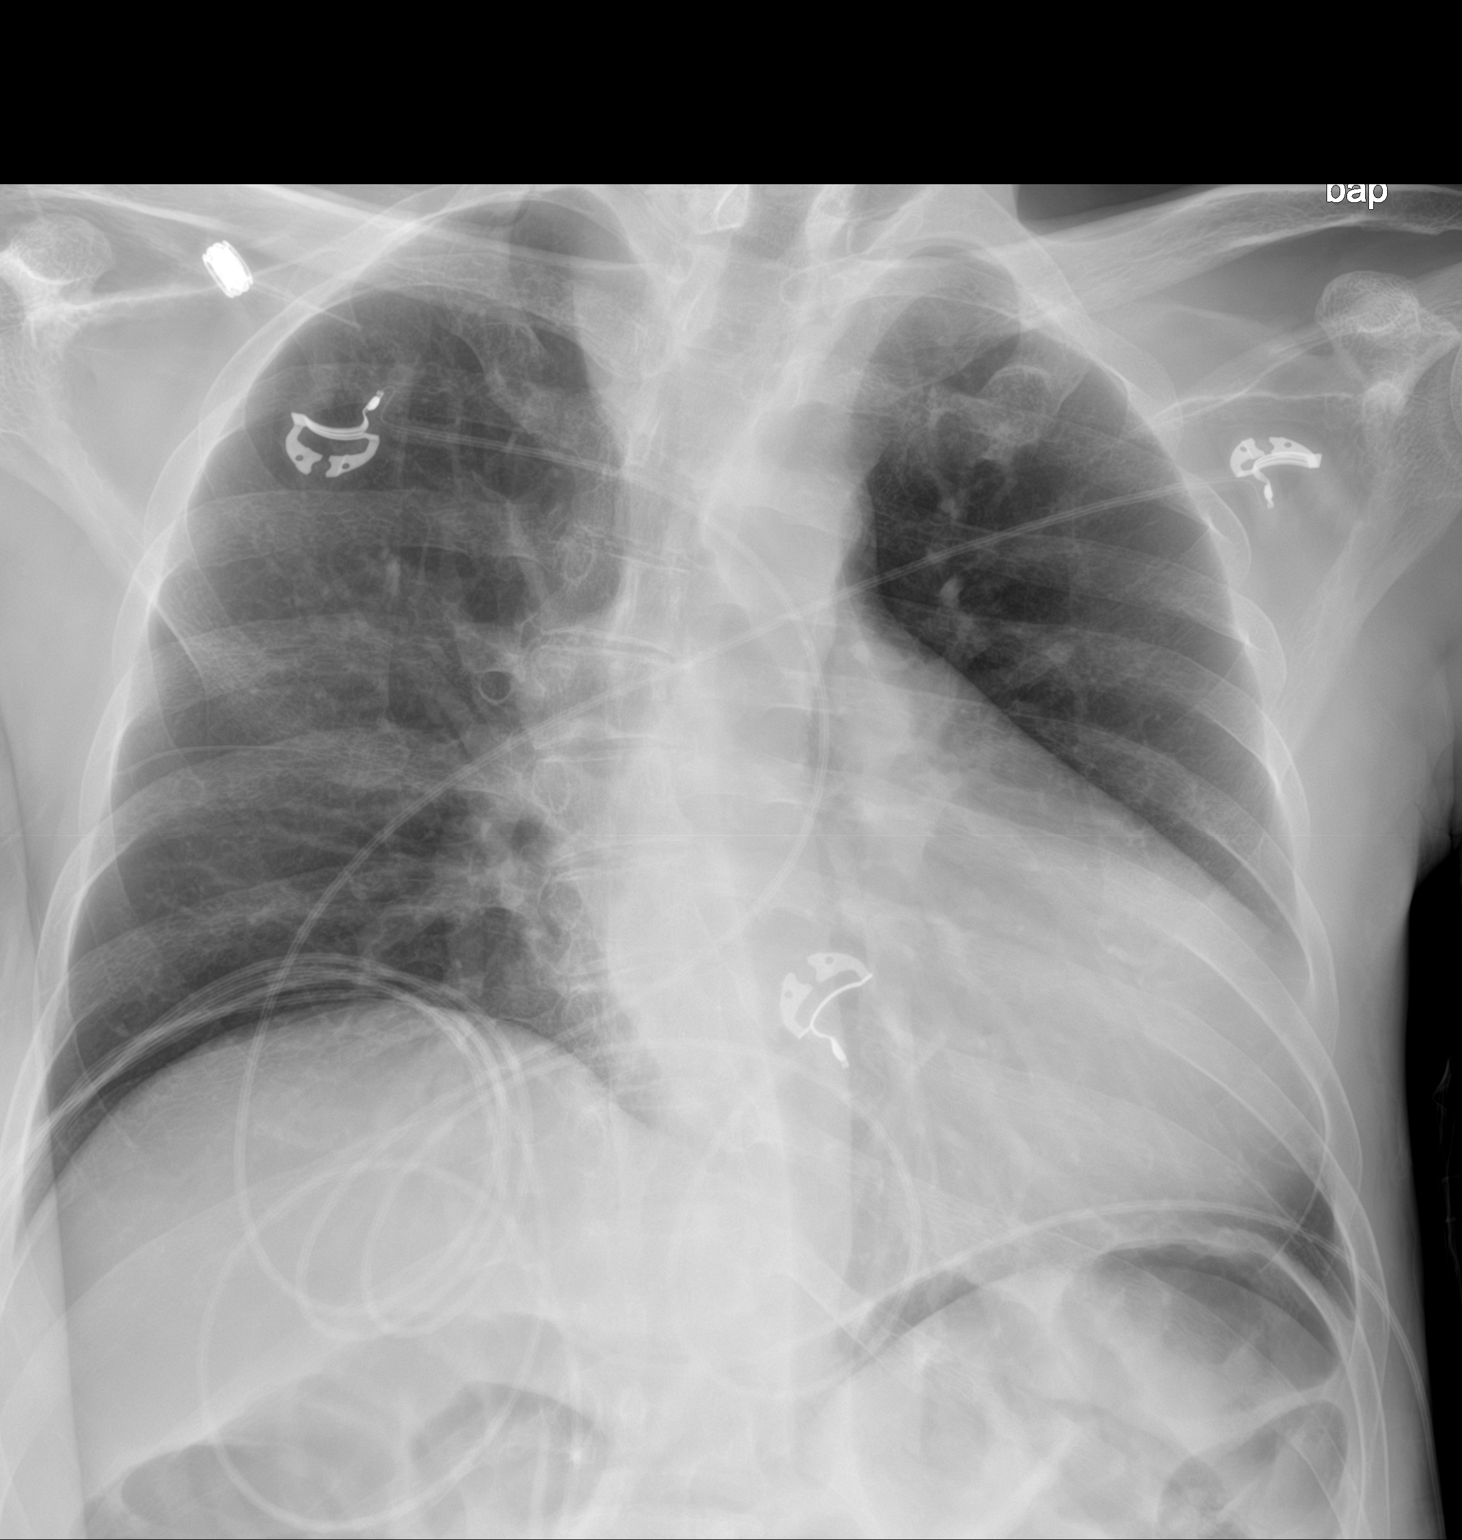

[1 of 1 positions shown; findings below may reference images not displayed]

FINDINGS: Stable cardiac enlargement. No pleural effusion or edema. No
airspace densities identified. Visualized osseous structures are
notable for thoracic scoliosis.
IMPRESSION: 1. No acute cardiopulmonary abnormalities.
2. Cardiac enlargement.

## 2022-12-04 ENCOUNTER — Other Ambulatory Visit: Payer: Medicaid Other

## 2022-12-08 ENCOUNTER — Ambulatory Visit: Payer: Medicaid Other

## 2022-12-08 DIAGNOSIS — Z8673 Personal history of transient ischemic attack (TIA), and cerebral infarction without residual deficits: Secondary | ICD-10-CM

## 2022-12-08 DIAGNOSIS — R072 Precordial pain: Secondary | ICD-10-CM

## 2022-12-12 NOTE — Progress Notes (Signed)
Please schedule him for an OV to discuss abnormal stress test  Regadenoson Nuclear stress test 12/08/2022: There is a moderate sized mild reversible defect in the inferior and apical regions.  Overall LV systolic function is abnormal with mild inferior hypokinesis. Stress LV EF: 42%.  Nondiagnostic ECG stress. Frequent PVCs, ventricular bigeminy noted during Lexiscan infusion.  No symptoms reported. The blood pressure response was physiologic. No previous exam available for comparison. Intermediate risk

## 2022-12-30 ENCOUNTER — Telehealth: Payer: Self-pay

## 2022-12-30 NOTE — Telephone Encounter (Signed)
Patient's catregiver/family called requesting that demographics and clinical along with order

## 2022-12-30 NOTE — Progress Notes (Signed)
Please schedule echo and also OV with me to discuss results

## 2023-01-13 ENCOUNTER — Other Ambulatory Visit: Payer: Medicaid Other

## 2023-01-14 ENCOUNTER — Other Ambulatory Visit: Payer: Medicaid Other

## 2023-01-14 DIAGNOSIS — I7121 Aneurysm of the ascending aorta, without rupture: Secondary | ICD-10-CM

## 2023-01-14 DIAGNOSIS — Q2543 Congenital aneurysm of aorta: Secondary | ICD-10-CM

## 2023-01-15 NOTE — Progress Notes (Signed)
Echocardiogram 01/14/2023: Left ventricle cavity is normal in size. Speckeld pattern of left ventricle, as previously stated on 01/22/2021, raises suspicion but not confirmation of infiltrative cardiomyopathy. Mild concentric hypertrophy of the left ventricle. Normal global wall motion. Normal LV systolic function with EF 71%. Doppler evidence of grade I (impaired) diastolic dysfunction, normal LAP.  The aortic root is dilated, measuring 3.8 cm at sinus of Valsalva.  Left atrial cavity is mildly dilated at 39.3 ml/m^2. PFO occluder in place without residual shunting.  Trileaflet aortic valve. Moderate (Grade II) aortic regurgitation. Mild (Grade I) mitral regurgitation. Mild tricuspid regurgitation. Mild pulmonic regurgitation. No evidence of pulmonary hypertension. Aortic root dilatation not reported on study dated 01/22/2021 but noted 4.4 cm on CT chest 11/17/2022. Recommend clinical correlation.

## 2023-02-12 ENCOUNTER — Ambulatory Visit: Payer: Medicaid Other | Admitting: Cardiology

## 2023-02-12 NOTE — Progress Notes (Deleted)
Primary Physician/Referring:  Norm Salt, PA  Patient ID: Harry [name], male    DOB: [birth year], 47 y.o.   MRN: [id number]  No chief complaint on file.  HPI:    Harry [name]  is a 47 y.o. male with history of hypertension, hyperlipidemia, non-ischemic cardiomyopathy likely secondary to untreated hypertension, right MCA stroke SP PFO closure on 01/22/2021, last episode of altered mental status, possible seizure, hypertensive urgency in December 2022 post COVID infection, felt to be probably not related to seizure disorder and Keppra was discontinued.  This is a post ED visit, presented with chest pain, ruled out for ACS and discharged home on 11/17/2022.  Patient described pain as epigastric pain focal, dull ongoing for a day and hence presented to the emergency room.  He was ruled out for myocardial infarction.  He has not had any recurrence.  Except for continued weakness in his left upper extremity, he has no specific complaints.  He is accompanied by his brother.  Past Medical History:  Diagnosis Date   Hypertension    PFO (patent foramen ovale)    Stroke [facility])    Past Surgical History:  Procedure Laterality Date   BUBBLE STUDY  10/08/2020   Procedure: BUBBLE STUDY;  Surgeon: Quintella Reichert, MD;  Location: [facility] ENDOSCOPY;  Service: Cardiovascular;;   PATENT FORAMEN OVALE(PFO) CLOSURE N/A 01/22/2021   Procedure: PATENT FORAMEN OVALE (PFO) CLOSURE;  Surgeon: Yates Decamp, MD;  Location: [facility] INVASIVE CV LAB;  Service: Cardiovascular;  Laterality: N/A;   RIGHT HEART CATH N/A 01/22/2021   Procedure: RIGHT HEART CATH;  Surgeon: Yates Decamp, MD;  Location: [facility] INVASIVE CV LAB;  Service: Cardiovascular;  Laterality: N/A;   TEE WITHOUT CARDIOVERSION N/A 10/08/2020   Procedure: TRANSESOPHAGEAL ECHOCARDIOGRAM (TEE);  Surgeon: Quintella Reichert, MD;  Location: [facility] ENDOSCOPY;  Service: Cardiovascular;  Laterality: N/A;   Family History  Problem Relation Age of Onset   Cervical cancer Mother     Stroke Father    Heart Problems Sister        died from blood clot   Diabetes Brother     Social History   Tobacco Use   Smoking status: Never   Smokeless tobacco: Never  Substance Use Topics   Alcohol use: Never   Marital Status: Single  ROS  Review of Systems  Cardiovascular:  Negative for chest pain, dyspnea on exertion and leg swelling.   Objective      11/26/2022    9:43 AM 11/17/2022   10:00 AM 11/17/2022    9:00 AM  Vitals with BMI  Height 5\' 7"     Weight 108 lbs 10 oz    BMI 17.01    Systolic 135 139 188  Diastolic 76 90 82  Pulse 65 75 73   There were no vitals taken for this visit.   Physical Exam Neck:     Vascular: No carotid bruit or JVD.  Cardiovascular:     Rate and Rhythm: Normal rate and regular rhythm.     Pulses: Intact distal pulses.     Heart sounds: Normal heart sounds. No murmur heard.    No gallop.  Pulmonary:     Effort: Pulmonary effort is normal.     Breath sounds: Normal breath sounds.  Abdominal:     General: Bowel sounds are normal.     Palpations: Abdomen is soft.  Musculoskeletal:     Right lower leg: No edema.     Left lower leg: No  edema.  Neurological:     Mental Status: Mental status is at baseline.     Laboratory examination:   Recent Labs    06/20/22 1038 11/17/22 0224  NA 138 139  K 3.6 3.2*  CL 105 100  CO2 24 24  GLUCOSE 117* 151*  BUN 21* 10  CREATININE 1.19 0.72  CALCIUM 7.7* 9.4  GFRNONAA >60 >60    Lab Results  Component Value Date   GLUCOSE 151 (H) 11/17/2022   NA 139 11/17/2022   K 3.2 (L) 11/17/2022   CL 100 11/17/2022   CO2 24 11/17/2022   BUN 10 11/17/2022   CREATININE 0.72 11/17/2022   GFRNONAA >60 11/17/2022   CALCIUM 9.4 11/17/2022   PHOS 3.2 10/09/2020   PROT 6.2 (L) 11/17/2022   ALBUMIN 3.3 (L) 11/17/2022   BILITOT 1.0 11/17/2022   ALKPHOS 69 11/17/2022   AST 33 11/17/2022   ALT 29 11/17/2022   ANIONGAP 15 11/17/2022      Lab Results  Component Value Date   ALT 29  11/17/2022   AST 33 11/17/2022   ALKPHOS 69 11/17/2022   BILITOT 1.0 11/17/2022       Latest Ref Rng & Units 11/17/2022    2:24 AM 06/20/2022   10:38 AM 10/13/2020    2:45 AM  Hepatic Function  Total Protein 6.5 - 8.1 g/dL 6.2  5.2  4.2   Albumin 3.5 - 5.0 g/dL 3.3  2.4  1.4   AST 15 - 41 U/L 33  32  28   ALT 0 - 44 U/L 29  18  17    Alk Phosphatase 38 - 126 U/L 69  51  51   Total Bilirubin 0.3 - 1.2 mg/dL 1.0  0.3  0.4       Latest Ref Rng & Units 11/17/2022    2:24 AM 06/20/2022   10:38 AM 01/22/2021   11:37 AM  CBC  WBC 4.0 - 10.5 K/uL 9.8  5.8    Hemoglobin 13.0 - 17.0 g/dL 16.1  9.8  8.5   Hematocrit 39.0 - 52.0 % 35.8  30.3  25.0   Platelets 150 - 400 K/uL 239  219      Lab Results  Component Value Date   CHOL 297 (H) 10/02/2020   HDL 73 10/02/2020   LDLCALC 212 (H) 10/02/2020   TRIG 62 10/02/2020   CHOLHDL 4.1 10/02/2020    HEMOGLOBIN A1C Lab Results  Component Value Date   HGBA1C 5.3 10/02/2020   MPG 105.41 10/02/2020   Lab Results  Component Value Date   TSH 1.055 10/06/2020    External labs:   Labs 06/27/2021:  TSH normal at 3.06.  A1c 5.7%.  Total cholesterol 138, triglycerides 91, HDL 50, LDL 71.  Non-HDL cholesterol 88.  Radiology:   CT angiogram chest 11/17/2022: Cardiovascular: The aortic root at the sinuses of Valsalva measures aneurysmal at 4.4 cm. The remainder is within normal caliber limits with mild calcific plaques, scattered calcification in the great vessels. There is no dissection or stenosis. 2. There is mild cardiomegaly again with septal and left ventricular wall hypertrophy in general but with focal thinning at the left ventricular apex consistent with remote ischemia.  No pericardial effusion is noted.  No pulmonary embolism. 3.  Prominent pulmonary trunk, 3.5 cm, previously 3.3 compared to 06/20/2022. 4. 1.7 cm left thyroid nodule, previously 1.1 cm. New 9 mm right thyroid nodule. Nonemergent follow-up ultrasound recommended. 5.  Gynecomastia.   Cardiac Studies:  Ambulatory cardiac telemetry 12/03/2020- - 12/16/2020: Predominant rhythm was sinus with first-degree AV block.  Minimum heart rate 45 bpm, maximum heart rate 120 bpm, average heart rate 65 bpm.  There were no patient triggered events.  Patient had single episode of supraventricular tachycardia lasting 6 beats.  Rare PACs and PVCs.  Ventricular trigeminy was present.  No evidence of ventricular tachycardia, pauses >3 seconds, high degree AV block, or atrial fibrillation.  Atrial septal defect repair 01/22/2021: There was a moderate sized PFO with ASD anatomy, successful repair with implantation of a 10 mm Amplatzer ASO device.  Neck Strongly positive double contrast study prior to the procedure, weakly positive double contrast study postprocedure suggestive of residual right to left shunting. Normal right heart pressures.  Normal cardiac output and cardiac index.  Qp/Qs normal.  No significant (right to left) shunting.  PCV MYOCARDIAL PERFUSION WITH LEXISCAN 12/08/2022  Narrative Regadenoson Nuclear stress test 12/08/2022: There is a moderate sized mild reversible defect in the inferior and apical regions. Overall LV systolic function is abnormal with mild inferior hypokinesis. Stress LV EF: 42%. Nondiagnostic ECG stress. Frequent PVCs, ventricular bigeminy noted during Lexiscan infusion.  No symptoms reported. The blood pressure response was physiologic. No previous exam available for comparison. Intermediate risk.   PCV ECHOCARDIOGRAM COMPLETE 01/14/2023  Narrative Echocardiogram 01/14/2023: Left ventricle cavity is normal in size. Speckeld pattern of left ventricle, as previously stated on 01/22/2021, raises suspicion but not confirmation of infiltrative cardiomyopathy. Mild concentric hypertrophy of the left ventricle. Normal global wall motion. Normal LV systolic function with EF 71%. Doppler evidence of grade I (impaired) diastolic dysfunction, normal  LAP. The aortic root is dilated, measuring 3.8 cm at sinus of Valsalva. Left atrial cavity is mildly dilated at 39.3 ml/m^2. PFO occluder in place without residual shunting. Trileaflet aortic valve. Moderate (Grade II) aortic regurgitation. Mild (Grade I) mitral regurgitation. Mild tricuspid regurgitation. Mild pulmonic regurgitation. No evidence of pulmonary hypertension. Aortic root dilatation not reported on study dated 01/22/2021 but noted 4.4 cm on CT chest 11/17/2022. Recommend clinical correlation.  Compared to 06/03/2021, LVEF no change.  Compared to 10/02/2020, LVEF has improved from 40 to 45%.    EKG:   EKG 11/18/2022: Sinus rhythm with first-degree AV block at the rate of 63 bpm, left axis deviation, left intrafascicular block.  No evidence of ischemia, normal QT interval.  Compared to 07/29/2021, First-degree AV block new,   Nonspecific T wave flattening no longer present.  Medications and allergies  No Known Allergies   Medication list   Current Outpatient Medications:    amLODipine (NORVASC) 10 MG tablet, Take 1 tablet (10 mg total) by mouth daily. (Patient taking differently: Take 5 mg by mouth daily.), Disp: , Rfl:    aspirin 325 MG EC tablet, Take 1 tablet by mouth daily., Disp: , Rfl:    atorvastatin (LIPITOR) 80 MG tablet, Take 1 tablet (80 mg total) by mouth daily., Disp: , Rfl:    calcium carbonate (OS-CAL) 1250 (500 Ca) MG chewable tablet, Chew 1 tablet by mouth as needed., Disp: , Rfl:    citalopram (CELEXA) 20 MG tablet, Take 1 tablet (20 mg total) by mouth daily., Disp: , Rfl:    Diapers & Supplies MISC, 1 each by Does not apply route in the morning, at noon, and at bedtime., Disp: 270 each, Rfl: 3   ferrous sulfate 325 (65 FE) MG tablet, Take 1 tablet by mouth daily., Disp: , Rfl:    losartan (COZAAR) 25 MG tablet, Take  1 tablet (25 mg total) by mouth daily., Disp: 90 tablet, Rfl: 3   MAGNESIUM OXIDE 400 PO, Take 400 mg by mouth daily., Disp: , Rfl:    metoprolol  succinate (TOPROL-XL) 50 MG 24 hr tablet, Take 1 tablet (50 mg total) by mouth daily. Take with or immediately following a meal., Disp: 90 tablet, Rfl: 3   ondansetron (ZOFRAN) 4 MG tablet, Take 1 tablet by mouth as needed., Disp: , Rfl:   Assessment     ICD-10-CM   1. Precordial pain  R07.2     2. Abnormal nuclear stress test  R94.39     3. Hypertensive heart disease without heart failure  I11.9     4. Sinus of Valsalva aneurysm  Q25.43     5. Hypercholesteremia  E78.00         No orders of the defined types were placed in this encounter.  No orders of the defined types were placed in this encounter.  There are no discontinued medications.    Recommendations:   Harry [name] is a 47 y.o.  male with history of hypertension, hyperlipidemia, non-ischemic cardiomyopathy with preserved LVEF likely secondary to untreated hypertension, right MCA stroke SP PFO closure on 01/22/2021, last episode of altered mental status, possible seizure, hypertensive urgency in December 2022 post COVID infection, felt to be probably not related to seizure disorder and Keppra was discontinued.  He was seen in the emergency room in June 2024 with chest pain, underwent outpatient stress testing and repeat echocardiogram and presents for follow-up.  ***  1. Precordial pain Patient chest pain appears to be clearly most skeletal however patient has never had cardiac evaluation with regard to risk stratification and ischemic workup and his mild decrease in LVEF was presumed to be secondary to nonischemic cardiomyopathy.  However recent CT scan that was performed in the emergency room reveals left ventricular apical thinning suggestive of remote ischemia or infarct.  Hence we will schedule him for a Lexiscan nuclear stress test. - PCV MYOCARDIAL PERFUSION WITH LEXISCAN; Future  2. Aneurysm of ascending aorta without rupture (HCC) I reviewed the CT scan, sinus of Valsalva aneurysm and ascending aortic aneurysm  appears to be very stable, will cancel his echocardiogram to be done now and scheduled for next year prior to his next office visit. - PCV ECHOCARDIOGRAM COMPLETE; Future  3. Sinus of Valsalva aneurysm Same as above - EKG 12-Lead - PCV ECHOCARDIOGRAM COMPLETE; Future  4. Hypercholesteremia Present under control  5. Primary hypertension Normal blood pressure, presently on ARB and also low-dose beta-blocker and amlodipine continue the same.  6. History of stroke - with residual left hemiplegia History of stroke, he wants diapers, I sent the supplies.  Unless nuclear stress test is markedly abnormal I will see him back on annual basis.  I reassured him.  I have discussed with his brother and patient regarding the symptoms of angina pectoris.  Discussed musculoskeletal chest pain as well. - PCV MYOCARDIAL PERFUSION WITH LEXISCAN; Future - Diapers & Supplies MISC; 1 each by Does not apply route in the morning, at noon, and at bedtime.  Dispense: 270 each; Refill:    Yates Decamp, MD, [facility] 02/12/2023, 7:09 AM Office: [phone]

## 2023-02-25 ENCOUNTER — Other Ambulatory Visit: Payer: Self-pay

## 2023-02-25 DIAGNOSIS — I10 Essential (primary) hypertension: Secondary | ICD-10-CM

## 2023-02-25 DIAGNOSIS — R072 Precordial pain: Secondary | ICD-10-CM

## 2023-03-16 ENCOUNTER — Ambulatory Visit: Payer: Medicaid Other | Admitting: Cardiology

## 2023-03-16 DIAGNOSIS — I7121 Aneurysm of the ascending aorta, without rupture: Secondary | ICD-10-CM | POA: Insufficient documentation

## 2023-03-16 DIAGNOSIS — R072 Precordial pain: Secondary | ICD-10-CM | POA: Insufficient documentation

## 2023-03-16 DIAGNOSIS — I517 Cardiomegaly: Secondary | ICD-10-CM | POA: Insufficient documentation

## 2023-03-16 DIAGNOSIS — I428 Other cardiomyopathies: Secondary | ICD-10-CM | POA: Insufficient documentation

## 2023-03-16 NOTE — Progress Notes (Deleted)
  Cardiology Office Note:    Date:  03/16/2023  ID:  Harry [name], DOB 1976-06-11, MRN [id number] PCP: Norm Salt, PA  [facility] Providers Cardiologist:  None { Click to update primary MD,subspecialty MD or APP then REFRESH:1}    {Click to Open Review  :1}   Patient Profile:      Non-ischemic cardiomyopathy  Presumed due to untreated hypertension  TTE 09/2020: EF 40-45 TTE 01/2021: normal EF TTE 01/14/23: LV speckled pattern, mild LVH, no RWMA, EF 71, Gr 1 DD, SoV 3.8 cm, PFO device w/o shunting, mod AI, mild MR, mild TR, mild PI Myoview 12/08/22: Inf and apical ischemia, EF 42, Intermediate Risk Hx of R MCA CVA  Monitor 12/2020: no VT, pauses, high grade AVB or AFib Patent Foramen Ovale s/p closure in 01/2021 Thoracic Aortic Aneurysm at Sinus of Valsalva  CT 11/2022: SoV 44 mm TTE 12/2022: SoV 38 mm Hypertension  Hyperlipidemia  Hx of ?Seizures       {      :1}   History of Present Illness:  Discussed the use of AI scribe software for clinical note transcription with the patient, who gave verbal consent to proceed.  Harry DRAYLON MERCADEL is a 47 y.o. male who returns for follow up of chest pain. He was last seen by Dr. Jacinto Halim in June 2024 after a visit to the ED with chest pain. His hsT was minimally elevated and flat. There was apical thinning noted on a CT scan. A follow up echocardiogram showed normal EF. There was also a speckled LV pattern noted. A follow up Myoview showed inferior and apical ischemia. ***          ROS   See HPI ***    Studies Reviewed:       *** Risk Assessment/Calculations:   {Does this patient have ATRIAL FIBRILLATION?[phone]} No BP recorded.  {Refresh Note OR Click here to enter BP  :1}***       Physical Exam:   VS:  There were no vitals taken for this visit.   Wt Readings from Last 3 Encounters:  11/26/22 108 lb 9.6 oz (49.3 kg)  11/17/22 115 lb (52.2 kg)  07/29/21 110 lb 6.4 oz (50.1 kg)    Physical Exam***     Assessment  and Plan:   Assessment & Plan Precordial pain  Non-ischemic cardiomyopathy (HCC)  Aneurysm of ascending aorta without rupture (HCC)  LVH (left ventricular hypertrophy)   Assessment and Plan             {      :1}    {Are you ordering a CV Procedure (e.g. stress test, cath, DCCV, TEE, etc)?   Press F2        :[id number]}  Dispo:  No follow-ups on file.  Signed, Tereso Newcomer, PA-C

## 2023-03-17 ENCOUNTER — Encounter (HOSPITAL_COMMUNITY): Payer: Self-pay | Admitting: Cardiology

## 2023-03-17 ENCOUNTER — Ambulatory Visit: Payer: Medicaid Other | Attending: Cardiology | Admitting: Physician Assistant

## 2023-03-17 DIAGNOSIS — I428 Other cardiomyopathies: Secondary | ICD-10-CM

## 2023-03-17 DIAGNOSIS — R072 Precordial pain: Secondary | ICD-10-CM

## 2023-03-17 DIAGNOSIS — I517 Cardiomegaly: Secondary | ICD-10-CM

## 2023-03-17 DIAGNOSIS — I7121 Aneurysm of the ascending aorta, without rupture: Secondary | ICD-10-CM

## 2023-03-18 ENCOUNTER — Encounter: Payer: Self-pay | Admitting: Physician Assistant

## 2023-03-31 ENCOUNTER — Telehealth (HOSPITAL_COMMUNITY): Payer: Self-pay | Admitting: Cardiology

## 2023-03-31 NOTE — Telephone Encounter (Signed)
Just an FYI. We have made several attempts to contact this patient including sending a letter to schedule or reschedule their echocardiogram. We will be removing the patient from the echo WQ.    03/17/23 NO VM set up- MAILED LETTER [facility]       Thank you

## 2023-05-29 ENCOUNTER — Other Ambulatory Visit: Payer: Self-pay

## 2023-05-29 ENCOUNTER — Telehealth: Payer: Self-pay | Admitting: Cardiology

## 2023-05-29 MED ORDER — METOPROLOL SUCCINATE ER 50 MG PO TB24: 50.0000 mg | ORAL_TABLET | Freq: Every day | ORAL | 1 refills | Status: AC

## 2023-05-29 MED ORDER — LOSARTAN POTASSIUM 25 MG PO TABS: 25.0000 mg | ORAL_TABLET | Freq: Every day | ORAL | 1 refills | Status: DC

## 2023-05-29 MED ORDER — AMLODIPINE BESYLATE 10 MG PO TABS: 10.0000 mg | ORAL_TABLET | Freq: Every day | ORAL | 1 refills | Status: AC

## 2023-05-29 NOTE — Telephone Encounter (Signed)
*  STAT* If patient is at the pharmacy, call can be transferred to refill team.   1. Which medications need to be refilled? (please list name of each medication and dose if known)   amLODipine (NORVASC) 10 MG tablet  metoprolol succinate (TOPROL-XL) 50 MG 24 hr tablet (Expired)  losartan (COZAAR) 25 MG tablet (Expired)   2. Would you like to learn more about the convenience, safety, & potential cost savings by using the [facility]?   3. Are you open to using the [facility] (Type [facility]. ).  4. Which pharmacy/location (including street and city if local pharmacy) is medication to be sent to?  [facility] [address] [place] - [address]   5. Do they need a 30 day or 90 day supply?   90 supply  Caller Jaynie Bream) stated patient is completely out of these medications.

## 2023-06-05 ENCOUNTER — Telehealth: Payer: Self-pay | Admitting: Cardiology

## 2023-06-05 ENCOUNTER — Other Ambulatory Visit: Payer: Self-pay

## 2023-06-05 MED ORDER — ATORVASTATIN CALCIUM 80 MG PO TABS: 80.0000 mg | ORAL_TABLET | Freq: Every day | ORAL | 1 refills | Status: DC

## 2023-06-05 NOTE — Telephone Encounter (Signed)
*  STAT* If patient is at the pharmacy, call can be transferred to refill team.   1. Which medications need to be refilled? (please list name of each medication and dose if known)   atorvastatin (LIPITOR) 80 MG tablet    2. Which pharmacy/location (including street and city if local pharmacy) is medication to be sent to?[facility] [address] [place] - [address]   3. Do they need a 30 day or 90 day supply? 90 day

## 2023-06-08 MED ORDER — ATORVASTATIN CALCIUM 80 MG PO TABS: 80.0000 mg | ORAL_TABLET | Freq: Every day | ORAL | 1 refills | Status: AC

## 2023-06-08 NOTE — Telephone Encounter (Signed)
Pt's medication was sent to pt's pharmacy as requested. Confirmation received.  °

## 2023-06-08 NOTE — Telephone Encounter (Signed)
Pt calling to f/u on refill request from 12/20. Pt is out of medication

## 2023-07-14 ENCOUNTER — Telehealth: Payer: Self-pay | Admitting: Cardiology

## 2023-07-14 MED ORDER — LOSARTAN POTASSIUM 25 MG PO TABS: 25.0000 mg | ORAL_TABLET | Freq: Every day | ORAL | 1 refills | Status: AC

## 2023-07-14 NOTE — Telephone Encounter (Signed)
*  STAT* If patient is at the pharmacy, call can be transferred to refill team.   1. Which medications need to be refilled? (please list name of each medication and dose if known) need a new prescription for Losartan   2. Would you like to learn more about the convenience, safety, & potential cost savings by using the [facility]?    3. Are you open to using the [facility] (Type [facility].   4. Which pharmacy/location (including street and city if local pharmacy) is medication to be sent to?  [facility] [address] [place], [place]   5. Do they need a 30 day or 90 day supply? 90 days and refills- please call today- out of medicine

## 2023-11-16 ENCOUNTER — Encounter: Payer: Self-pay | Admitting: Physician Assistant

## 2023-11-17 ENCOUNTER — Other Ambulatory Visit: Payer: Medicaid Other

## 2023-11-26 ENCOUNTER — Ambulatory Visit: Payer: Medicaid Other | Admitting: Cardiology

## 2024-08-24 ENCOUNTER — Ambulatory Visit: Payer: Self-pay | Admitting: Dermatology
